# Patient Record
Sex: Male | Born: 1965 | Race: Black or African American | Hispanic: No | Marital: Married | Smoking: Former smoker
Health system: Southern US, Community
[De-identification: ages and names within clinical notes are randomized; demographics above are authoritative.]

## PROBLEM LIST (undated history)

## (undated) DIAGNOSIS — E7800 Pure hypercholesterolemia, unspecified: Secondary | ICD-10-CM

## (undated) DIAGNOSIS — K219 Gastro-esophageal reflux disease without esophagitis: Secondary | ICD-10-CM

## (undated) DIAGNOSIS — I639 Cerebral infarction, unspecified: Secondary | ICD-10-CM

## (undated) DIAGNOSIS — M542 Cervicalgia: Secondary | ICD-10-CM

## (undated) DIAGNOSIS — I10 Essential (primary) hypertension: Secondary | ICD-10-CM

## (undated) HISTORY — DX: Cervicalgia: M54.2

## (undated) HISTORY — DX: Gastro-esophageal reflux disease without esophagitis: K21.9

---

## 2001-03-13 ENCOUNTER — Encounter: Payer: Self-pay | Admitting: Family Medicine

## 2001-03-13 ENCOUNTER — Ambulatory Visit (HOSPITAL_COMMUNITY): Admission: RE | Admit: 2001-03-13 | Discharge: 2001-03-13 | Payer: Self-pay | Admitting: Family Medicine

## 2006-04-17 ENCOUNTER — Emergency Department (HOSPITAL_COMMUNITY): Admission: EM | Admit: 2006-04-17 | Discharge: 2006-04-17 | Payer: Self-pay | Admitting: Emergency Medicine

## 2007-06-18 HISTORY — PX: OTHER SURGICAL HISTORY: SHX169

## 2009-12-19 ENCOUNTER — Emergency Department (HOSPITAL_COMMUNITY): Admission: EM | Admit: 2009-12-19 | Discharge: 2009-12-20 | Payer: Self-pay | Admitting: Emergency Medicine

## 2010-01-21 ENCOUNTER — Emergency Department (HOSPITAL_COMMUNITY): Admission: EM | Admit: 2010-01-21 | Discharge: 2010-01-21 | Payer: Self-pay | Admitting: Emergency Medicine

## 2010-10-15 ENCOUNTER — Emergency Department (HOSPITAL_COMMUNITY): Payer: PRIVATE HEALTH INSURANCE

## 2010-10-15 ENCOUNTER — Emergency Department (HOSPITAL_COMMUNITY)
Admission: EM | Admit: 2010-10-15 | Discharge: 2010-10-15 | Disposition: A | Discharge status: Home / Self Care | Payer: PRIVATE HEALTH INSURANCE | Attending: Emergency Medicine | Admitting: Emergency Medicine

## 2010-10-15 DIAGNOSIS — L989 Disorder of the skin and subcutaneous tissue, unspecified: Secondary | ICD-10-CM | POA: Insufficient documentation

## 2010-10-19 LAB — CULTURE, ROUTINE-ABSCESS

## 2014-01-18 ENCOUNTER — Encounter (HOSPITAL_COMMUNITY): Payer: Self-pay | Admitting: Emergency Medicine

## 2014-01-18 ENCOUNTER — Emergency Department (HOSPITAL_COMMUNITY)
Admission: EM | Admit: 2014-01-18 | Discharge: 2014-01-18 | Disposition: A | Discharge status: Home / Self Care | Payer: PRIVATE HEALTH INSURANCE | Attending: Emergency Medicine | Admitting: Emergency Medicine

## 2014-01-18 ENCOUNTER — Emergency Department (HOSPITAL_COMMUNITY): Payer: PRIVATE HEALTH INSURANCE

## 2014-01-18 DIAGNOSIS — Z791 Long term (current) use of non-steroidal anti-inflammatories (NSAID): Secondary | ICD-10-CM | POA: Insufficient documentation

## 2014-01-18 DIAGNOSIS — Z79899 Other long term (current) drug therapy: Secondary | ICD-10-CM | POA: Insufficient documentation

## 2014-01-18 DIAGNOSIS — M25569 Pain in unspecified knee: Secondary | ICD-10-CM | POA: Insufficient documentation

## 2014-01-18 DIAGNOSIS — M25562 Pain in left knee: Secondary | ICD-10-CM

## 2014-01-18 DIAGNOSIS — F17200 Nicotine dependence, unspecified, uncomplicated: Secondary | ICD-10-CM | POA: Insufficient documentation

## 2014-01-18 DIAGNOSIS — R52 Pain, unspecified: Secondary | ICD-10-CM | POA: Insufficient documentation

## 2014-01-18 MED ORDER — OXYCODONE-ACETAMINOPHEN 5-325 MG PO TABS
1.0000 | ORAL_TABLET | Freq: Once | ORAL | Status: AC
  Administered 2014-01-18: 1 via ORAL
  Filled 2014-01-18: qty 1

## 2014-01-18 MED ORDER — NAPROXEN SODIUM 220 MG PO TABS: ORAL_TABLET | ORAL | Status: DC

## 2014-01-18 MED ORDER — IBUPROFEN 800 MG PO TABS
800.0000 mg | ORAL_TABLET | Freq: Once | ORAL | Status: AC
  Administered 2014-01-18: 800 mg via ORAL
  Filled 2014-01-18: qty 1

## 2014-01-18 MED ORDER — OXYCODONE-ACETAMINOPHEN 5-325 MG PO TABS: 1.0000 | ORAL_TABLET | ORAL | Status: DC | PRN

## 2014-01-18 NOTE — ED Notes (Signed)
Lt knee pain since 2010, worse in last week.   No recent injury

## 2014-01-18 NOTE — Discharge Instructions (Signed)

## 2014-01-19 NOTE — ED Provider Notes (Signed)
CSN: [id number]     Arrival date & time 01/18/14  1210 History   First MD Initiated Contact with Patient 01/18/14 1221     Chief Complaint  Patient presents with  . Knee Pain     (Consider location/radiation/quality/duration/timing/severity/associated sxs/prior Treatment)  Philip Howard is a 48 y.o. male who presents to the Emergency Department complaining of left knee pain Patient is a 48 y.o. male presenting with knee pain. The history is provided by the patient.  Knee Pain Location:  Knee Time since incident:  1 week Pain details:    Quality:  Throbbing and aching   Radiates to:  Does not radiate   Severity:  Moderate   Onset quality:  Gradual   Timing:  Constant   Progression:  Worsening Chronicity:  New Dislocation: no   Foreign body present:  No foreign bodies Prior injury to area: previus arthroscopic surgery to same knee. Relieved by:  Rest Worsened by:  Activity, bearing weight and flexion Ineffective treatments:  None tried Associated symptoms: stiffness and swelling   Associated symptoms: no decreased ROM, no fever, no neck pain, no numbness and no tingling     History reviewed. No pertinent past medical history. Past Surgical History  Procedure Laterality Date  . Arthroscopic surgery     History reviewed. No pertinent family history. History  Substance Use Topics  . Smoking status: Current Every Day Smoker  . Smokeless tobacco: Not on file  . Alcohol Use: Yes    Review of Systems  Constitutional: Negative for fever and chills.  Genitourinary: Negative for dysuria and difficulty urinating.  Musculoskeletal: Positive for arthralgias, joint swelling and stiffness. Negative for neck pain.  Skin: Negative for color change and wound.  All other systems reviewed and are negative.     Allergies  Review of patient's allergies indicates no known allergies.  Home Medications   Prior to Admission medications   Medication Sig Start Date End Date  Taking? Authorizing Provider  naproxen sodium (ALEVE) 220 MG tablet Take two tabs po BID with food. 01/18/14   Azarah Dacy L. Driana Dazey, PA-C  oxyCODONE-acetaminophen (PERCOCET/ROXICET) 5-325 MG per tablet Take 1 tablet by mouth every 4 (four) hours as needed. 01/18/14   Lazer Wollard L. Hajar Penninger, PA-C   BP 129/66  Pulse 78  Temp(Src) 98.9 F (37.2 C) (Oral)  Resp 15  Ht 5\' 5"  (1.651 m)  Wt 188 lb 9 oz (85.531 kg)  BMI 31.38 kg/m2  SpO2 99% Physical Exam  Nursing note and vitals reviewed. Constitutional: He is oriented to person, place, and time. He appears well-developed and well-nourished. No distress.  Cardiovascular: Normal rate, regular rhythm, normal heart sounds and intact distal pulses.   Pulmonary/Chest: Effort normal and breath sounds normal. No respiratory distress.  Musculoskeletal: He exhibits edema and tenderness.  Diffuse ttp of the left knee.  Moderate STS.  No erythema, excessive warmth, or step-off deformity.  DP pulse brisk, distal sensation intact. Calf is soft and NT.  Neurological: He is alert and oriented to person, place, and time. He exhibits normal muscle tone. Coordination normal.  Skin: Skin is warm and dry. No erythema.    ED Course  Procedures (including critical care time) Labs Review Labs Reviewed - No data to display  Imaging Review Dg Knee Complete 4 Views Left  01/18/2014   CLINICAL DATA:  Knee pain  EXAM: LEFT KNEE - COMPLETE 4+ VIEW  COMPARISON:  None.  FINDINGS: No acute fracture or dislocation is seen. Very mild medial joint  space narrowing is noted. No soft tissue changes are noted.  IMPRESSION: Mild degenerative change without acute abnormality.   Electronically Signed   By: Inez Catalina M.D.   On: 01/18/2014 13:53     EKG Interpretation None      MDM   Final diagnoses:  Knee pain, acute, left   With degree of edema to the knee joint, possible ligamentous injury is suspected.  I have discussed possible MRI of knee with the patient, but he states that  he currently does not have insurance and prefers to f/u with orthopedics first and rather not incur the expense of an MRI.  No concerning sx's for septic joint.   [place] Dr. Luna Glasgow.  He agrees to see pt in his office tomorrow for f/u.  rx's for aleve and percocet for pain.  Pt is ambulatory and stable for d/c    Leroy Pettway L. Vanessa Hector, PA-C 01/19/14 1752

## 2014-01-25 NOTE — ED Provider Notes (Signed)
Medical screening examination/treatment/procedure(s) were performed by non-physician practitioner and as supervising physician I was immediately available for consultation/collaboration.   EKG Interpretation None        Maudry Diego, MD 01/25/14 [phone]

## 2016-10-15 DIAGNOSIS — I639 Cerebral infarction, unspecified: Secondary | ICD-10-CM

## 2016-10-15 HISTORY — DX: Cerebral infarction, unspecified: I63.9

## 2016-11-02 ENCOUNTER — Emergency Department (HOSPITAL_COMMUNITY): Payer: Self-pay

## 2016-11-02 ENCOUNTER — Encounter (HOSPITAL_COMMUNITY): Payer: Self-pay | Admitting: Emergency Medicine

## 2016-11-02 ENCOUNTER — Inpatient Hospital Stay (HOSPITAL_COMMUNITY)
Admission: EM | Admit: 2016-11-02 | Discharge: 2016-11-05 | DRG: 065 | Disposition: A | Discharge status: Rehabilitation Facility | Payer: Self-pay | Attending: Family Medicine | Admitting: Family Medicine

## 2016-11-02 DIAGNOSIS — R29898 Other symptoms and signs involving the musculoskeletal system: Secondary | ICD-10-CM

## 2016-11-02 DIAGNOSIS — E785 Hyperlipidemia, unspecified: Secondary | ICD-10-CM | POA: Diagnosis present

## 2016-11-02 DIAGNOSIS — F17210 Nicotine dependence, cigarettes, uncomplicated: Secondary | ICD-10-CM | POA: Diagnosis present

## 2016-11-02 DIAGNOSIS — R7303 Prediabetes: Secondary | ICD-10-CM | POA: Diagnosis present

## 2016-11-02 DIAGNOSIS — F1010 Alcohol abuse, uncomplicated: Secondary | ICD-10-CM | POA: Diagnosis present

## 2016-11-02 DIAGNOSIS — I110 Hypertensive heart disease with heart failure: Secondary | ICD-10-CM | POA: Diagnosis present

## 2016-11-02 DIAGNOSIS — I6501 Occlusion and stenosis of right vertebral artery: Secondary | ICD-10-CM | POA: Diagnosis present

## 2016-11-02 DIAGNOSIS — F1410 Cocaine abuse, uncomplicated: Secondary | ICD-10-CM | POA: Diagnosis present

## 2016-11-02 DIAGNOSIS — R739 Hyperglycemia, unspecified: Secondary | ICD-10-CM | POA: Diagnosis present

## 2016-11-02 DIAGNOSIS — R29700 NIHSS score 0: Secondary | ICD-10-CM | POA: Diagnosis present

## 2016-11-02 DIAGNOSIS — I639 Cerebral infarction, unspecified: Principal | ICD-10-CM | POA: Diagnosis present

## 2016-11-02 DIAGNOSIS — R200 Anesthesia of skin: Secondary | ICD-10-CM | POA: Diagnosis present

## 2016-11-02 DIAGNOSIS — F1210 Cannabis abuse, uncomplicated: Secondary | ICD-10-CM | POA: Diagnosis present

## 2016-11-02 DIAGNOSIS — F1910 Other psychoactive substance abuse, uncomplicated: Secondary | ICD-10-CM | POA: Diagnosis present

## 2016-11-02 DIAGNOSIS — F17200 Nicotine dependence, unspecified, uncomplicated: Secondary | ICD-10-CM

## 2016-11-02 DIAGNOSIS — I5032 Chronic diastolic (congestive) heart failure: Secondary | ICD-10-CM | POA: Diagnosis present

## 2016-11-02 DIAGNOSIS — G8191 Hemiplegia, unspecified affecting right dominant side: Secondary | ICD-10-CM | POA: Diagnosis present

## 2016-11-02 DIAGNOSIS — E876 Hypokalemia: Secondary | ICD-10-CM | POA: Diagnosis present

## 2016-11-02 LAB — RAPID URINE DRUG SCREEN, HOSP PERFORMED
AMPHETAMINES: NOT DETECTED
BARBITURATES: NOT DETECTED
BENZODIAZEPINES: NOT DETECTED
COCAINE: POSITIVE — AB
Opiates: NOT DETECTED
Tetrahydrocannabinol: POSITIVE — AB

## 2016-11-02 LAB — CBC
HEMATOCRIT: 41.1 % (ref 39.0–52.0)
Hemoglobin: 13.8 g/dL (ref 13.0–17.0)
MCH: 30.9 pg (ref 26.0–34.0)
MCHC: 33.6 g/dL (ref 30.0–36.0)
MCV: 91.9 fL (ref 78.0–100.0)
PLATELETS: 252 10*3/uL (ref 150–400)
RBC: 4.47 MIL/uL (ref 4.22–5.81)
RDW: 15.4 % (ref 11.5–15.5)
WBC: 7 10*3/uL (ref 4.0–10.5)

## 2016-11-02 LAB — DIFFERENTIAL
Basophils Absolute: 0 10*3/uL (ref 0.0–0.1)
Basophils Relative: 0 %
Eosinophils Absolute: 0.2 10*3/uL (ref 0.0–0.7)
Eosinophils Relative: 3 %
Lymphocytes Relative: 38 %
Lymphs Abs: 2.7 10*3/uL (ref 0.7–4.0)
Monocytes Absolute: 0.5 10*3/uL (ref 0.1–1.0)
Monocytes Relative: 7 %
Neutro Abs: 3.7 10*3/uL (ref 1.7–7.7)
Neutrophils Relative %: 52 %

## 2016-11-02 LAB — COMPREHENSIVE METABOLIC PANEL
ALBUMIN: 3.9 g/dL (ref 3.5–5.0)
ALT: 38 U/L (ref 17–63)
AST: 27 U/L (ref 15–41)
Alkaline Phosphatase: 54 U/L (ref 38–126)
Anion gap: 8 (ref 5–15)
BUN: 24 mg/dL — AB (ref 6–20)
CO2: 22 mmol/L (ref 22–32)
CREATININE: 1.18 mg/dL (ref 0.61–1.24)
Calcium: 9.2 mg/dL (ref 8.9–10.3)
Chloride: 107 mmol/L (ref 101–111)
GFR calc Af Amer: >60 mL/min (ref >60)
GFR calc non Af Amer: >60 mL/min (ref >60)
Glucose, Bld: 107 mg/dL — ABNORMAL HIGH (ref 65–99)
POTASSIUM: 3.3 mmol/L — AB (ref 3.5–5.1)
SODIUM: 137 mmol/L (ref 135–145)
Total Bilirubin: 0.4 mg/dL (ref 0.3–1.2)
Total Protein: 6.6 g/dL (ref 6.5–8.1)

## 2016-11-02 LAB — URINALYSIS, ROUTINE W REFLEX MICROSCOPIC
BILIRUBIN URINE: NEGATIVE
GLUCOSE, UA: NEGATIVE mg/dL
HGB URINE DIPSTICK: NEGATIVE
Ketones, ur: NEGATIVE mg/dL
Leukocytes, UA: NEGATIVE
Nitrite: NEGATIVE
PROTEIN: NEGATIVE mg/dL
SPECIFIC GRAVITY, URINE: 1.017 (ref 1.005–1.030)
pH: 5 (ref 5.0–8.0)

## 2016-11-02 LAB — I-STAT CHEM 8, ED
BUN: 25 mg/dL — ABNORMAL HIGH (ref 6–20)
CHLORIDE: 106 mmol/L (ref 101–111)
Calcium, Ion: 1.22 mmol/L (ref 1.15–1.40)
Creatinine, Ser: 1.1 mg/dL (ref 0.61–1.24)
GLUCOSE: 106 mg/dL — AB (ref 65–99)
HEMATOCRIT: 44 % (ref 39.0–52.0)
Hemoglobin: 15 g/dL (ref 13.0–17.0)
POTASSIUM: 3.3 mmol/L — AB (ref 3.5–5.1)
Sodium: 139 mmol/L (ref 135–145)
TCO2: 23 mmol/L (ref 0–100)

## 2016-11-02 LAB — APTT: aPTT: 25 seconds (ref 24–36)

## 2016-11-02 LAB — ETHANOL: Alcohol, Ethyl (B): <5 mg/dL (ref <5)

## 2016-11-02 LAB — PROTIME-INR
INR: 0.98
Prothrombin Time: 12.9 seconds (ref 11.4–15.2)

## 2016-11-02 LAB — I-STAT TROPONIN, ED: Troponin i, poc: 0 ng/mL (ref 0.00–0.08)

## 2016-11-02 LAB — CBG MONITORING, ED: GLUCOSE-CAPILLARY: 111 mg/dL — AB (ref 65–99)

## 2016-11-02 MED ORDER — SENNOSIDES-DOCUSATE SODIUM 8.6-50 MG PO TABS
1.0000 | ORAL_TABLET | Freq: Every evening | ORAL | Status: DC | PRN
  Filled 2016-11-02: qty 1

## 2016-11-02 MED ORDER — ENOXAPARIN SODIUM 40 MG/0.4ML ~~LOC~~ SOLN
40.0000 mg | SUBCUTANEOUS | Status: DC
  Administered 2016-11-02 – 2016-11-04 (×3): 40 mg via SUBCUTANEOUS
  Filled 2016-11-02 (×2): qty 0.4

## 2016-11-02 MED ORDER — ACETAMINOPHEN 325 MG PO TABS
650.0000 mg | ORAL_TABLET | ORAL | Status: DC | PRN
  Administered 2016-11-03 – 2016-11-05 (×4): 650 mg via ORAL
  Filled 2016-11-02 (×5): qty 2

## 2016-11-02 MED ORDER — IOPAMIDOL (ISOVUE-370) INJECTION 76%
80.0000 mL | Freq: Once | INTRAVENOUS | Status: AC | PRN
  Administered 2016-11-02: 80 mL via INTRAVENOUS

## 2016-11-02 MED ORDER — POTASSIUM CHLORIDE CRYS ER 20 MEQ PO TBCR
30.0000 meq | EXTENDED_RELEASE_TABLET | Freq: Once | ORAL | Status: AC
  Administered 2016-11-02: 30 meq via ORAL

## 2016-11-02 MED ORDER — ACETAMINOPHEN 650 MG RE SUPP: 650.0000 mg | RECTAL | Status: DC | PRN

## 2016-11-02 MED ORDER — ASPIRIN 81 MG PO CHEW
324.0000 mg | CHEWABLE_TABLET | Freq: Once | ORAL | Status: AC
  Administered 2016-11-02: 324 mg via ORAL
  Filled 2016-11-02: qty 4

## 2016-11-02 MED ORDER — ACETAMINOPHEN 160 MG/5ML PO SOLN: 650.0000 mg | ORAL | Status: DC | PRN

## 2016-11-02 MED ORDER — ASPIRIN 325 MG PO TABS
325.0000 mg | ORAL_TABLET | Freq: Every day | ORAL | Status: DC
  Administered 2016-11-03 – 2016-11-05 (×3): 325 mg via ORAL
  Filled 2016-11-02 (×3): qty 1

## 2016-11-02 MED ORDER — SODIUM CHLORIDE 0.9 % IV SOLN
INTRAVENOUS | Status: AC
  Administered 2016-11-02 (×2): via INTRAVENOUS

## 2016-11-02 MED ORDER — POTASSIUM CHLORIDE CRYS ER 20 MEQ PO TBCR
EXTENDED_RELEASE_TABLET | ORAL | Status: AC
  Filled 2016-11-02: qty 2

## 2016-11-02 MED ORDER — ENOXAPARIN SODIUM 40 MG/0.4ML ~~LOC~~ SOLN
SUBCUTANEOUS | Status: AC
  Filled 2016-11-02: qty 0.4

## 2016-11-02 MED ORDER — ASPIRIN 300 MG RE SUPP: 300.0000 mg | Freq: Every day | RECTAL | Status: DC

## 2016-11-02 MED ORDER — STROKE: EARLY STAGES OF RECOVERY BOOK
Freq: Once | Status: AC
  Administered 2016-11-03: 11:00:00
  Filled 2016-11-02: qty 1

## 2016-11-02 NOTE — Progress Notes (Signed)
[facility] admissions paged to notify that patient is on the unit. Awaiting reply.

## 2016-11-02 NOTE — Progress Notes (Signed)
Patient admitted from [facility]. Patient alert and oriented x 4. Patient oriented to room. Tele placed and was verified. Will continue to monitor.

## 2016-11-02 NOTE — ED Provider Notes (Signed)
Emergency Department Provider Note   I have reviewed the triage vital signs and the nursing notes.   HISTORY  Chief Complaint Code Stroke   HPI Philip Howard is a 51 y.o. male presents to the emergency department by EMS for evaluation of his right arm "feeling funny." He notes symptoms began 1 hour prior to arrival. He denies numbness or specific weakness but states that his arm feels "heavy." No speech changes. Patient does note some feeling of "dragging my right leg" too when attempting to walk. No radiation of symptoms. No modifying factors. No other associated symptoms.   Last known normal: 2:00 PM   History reviewed. No pertinent past medical history.  There are no active problems to display for this patient.   Past Surgical History:  Procedure Laterality Date  . arthroscopic surgery        Allergies Patient has no known allergies.  History reviewed. No pertinent family history.  Social History Social History  Substance Use Topics  . Smoking status: Current Every Day Smoker    Packs/day: 0.50    Types: Cigarettes  . Smokeless tobacco: Not on file  . Alcohol use Yes    Review of Systems  Constitutional: No fever/chills Eyes: No visual changes. ENT: No sore throat. Cardiovascular: Denies chest pain. Respiratory: Denies shortness of breath. Gastrointestinal: No abdominal pain.  No nausea, no vomiting.  No diarrhea.  No constipation. Genitourinary: Negative for dysuria. Musculoskeletal: Negative for back pain. Skin: Negative for rash. Neurological: Negative for headaches. Positive right arm and leg numbness.   10-point ROS otherwise negative.  ____________________________________________   PHYSICAL EXAM:  VITAL SIGNS: Vitals:   11/02/16 1630 11/02/16 1700  BP: 138/67 (!) 147/90  Pulse: 61 80  Resp: 17 16  Temp:        Constitutional: Alert and oriented. Well appearing and in no acute distress. Eyes: Conjunctivae are normal. Head:  Atraumatic. Nose: No congestion/rhinnorhea. Mouth/Throat: Mucous membranes are moist.   Neck: No stridor.   Cardiovascular: Normal rate, regular rhythm. Good peripheral circulation. Grossly normal heart sounds.   Respiratory: Normal respiratory effort.  No retractions. Lungs CTAB. Gastrointestinal: Soft and nontender. No distention.  Musculoskeletal: No lower extremity tenderness nor edema. No gross deformities of extremities. Neurologic:  Normal speech and language. 4+/5 grip strength on the RUE. 5/5 strength in the RLE. No CN deficits 2-12. No pronator drift.  NIHSS: 1 Skin:  Skin is warm, dry and intact. No rash noted. Psychiatric: Mood and affect are normal. Speech and behavior are normal.  ____________________________________________   LABS (all labs ordered are listed, but only abnormal results are displayed)  Labs Reviewed  COMPREHENSIVE METABOLIC PANEL - Abnormal; Notable for the following:       Result Value   Potassium 3.3 (*)    Glucose, Bld 107 (*)    BUN 24 (*)    All other components within normal limits  RAPID URINE DRUG SCREEN, HOSP PERFORMED - Abnormal; Notable for the following:    Cocaine POSITIVE (*)    Tetrahydrocannabinol POSITIVE (*)    All other components within normal limits  I-STAT CHEM 8, ED - Abnormal; Notable for the following:    Potassium 3.3 (*)    BUN 25 (*)    Glucose, Bld 106 (*)    All other components within normal limits  CBG MONITORING, ED - Abnormal; Notable for the following:    Glucose-Capillary 111 (*)    All other components within normal limits  ETHANOL  PROTIME-INR  APTT  CBC  DIFFERENTIAL  URINALYSIS, ROUTINE W REFLEX MICROSCOPIC  I-STAT TROPOININ, ED   ____________________________________________  EKG   EKG Interpretation  Date/Time:  Saturday Nov 02 2016 15:30:27 EDT Ventricular Rate:  67 PR Interval:    QRS Duration: 105 QT Interval:  394 QTC Calculation: 416 R Axis:   69 Text Interpretation:  Sinus rhythm  Nonspecific T abnormalities, inferior leads No STEMI. No old tracing for comparison.  Confirmed by Nanda Quinton [phone]) on 11/02/2016 3:40:17 PM       ____________________________________________  RADIOLOGY  Ct Head Code Stroke Wo Contrast`  Result Date: 11/02/2016 CLINICAL DATA:  Code stroke. Sudden onset of right-sided weakness while eating lunch today. EXAM: CT HEAD WITHOUT CONTRAST TECHNIQUE: Contiguous axial images were obtained from the base of the skull through the vertex without intravenous contrast. COMPARISON:  None. FINDINGS: Brain: No acute infarct, hemorrhage, or mass lesion is present. The ventricles are of normal size. No significant extraaxial fluid collection is present. Vascular: No hyperdense vessel or unexpected calcification. Skull: Normal. Negative for fracture or focal lesion. Sinuses/Orbits: The paranasal sinuses and mastoid air cells are clear. The globes and orbits are within normal limits. ASPECTS [facility] Stroke Program Early CT Score) - Ganglionic level infarction (caudate, lentiform nuclei, internal capsule, insula, M1-M3 cortex): 7/7 - Supraganglionic infarction (M4-M6 cortex): 3/3 Total score (0-10 with 10 being normal): 10/10 IMPRESSION: 1. Negative CT of the head 2. ASPECTS is 10/10 These results were called by telephone at the time of interpretation on 11/02/2016 at 3:20 pm to Dr. Nanda Quinton , who verbally acknowledged these results. Electronically Signed   By: San Morelle M.D.   On: 11/02/2016 15:21    ____________________________________________   PROCEDURES  Procedure(s) performed:   Procedures  CRITICAL CARE Performed by: Margette Fast Total critical care time: 40 minutes Critical care time was exclusive of separately billable procedures and treating other patients. Critical care was necessary to treat or prevent imminent or life-threatening deterioration. Critical care was time spent personally by me on the following activities: development  of treatment plan with patient and/or surrogate as well as nursing, discussions with consultants, evaluation of patient's response to treatment, examination of patient, obtaining history from patient or surrogate, ordering and performing treatments and interventions, ordering and review of laboratory studies, ordering and review of radiographic studies, pulse oximetry and re-evaluation of patient's condition.  Nanda Quinton, MD Emergency Medicine  ____________________________________________   INITIAL IMPRESSION / ASSESSMENT AND PLAN / ED COURSE  Pertinent labs & imaging results that were available during my care of the patient were reviewed by me and considered in my medical decision making (see chart for details).  Patient resents to the emergency department for evaluation of sudden onset right upper extremity weakness. He has mild weakness in the right upper extremity. No appreciable deficits and right lower extremity. No facial asymmetry. CODE STROKE activated.   03:20 PM CT called back as normal per radiology.   03:29 PM  Tele-Neurology at bedside.   03:37 PM Spoke with Dr. Bernita Buffy with Neurology after video exam. He does not appreciate any weakness on video exam. Patient continues to experience some right arm "heavingess." Gives NIHSS of 0. Discussed tPA with the patient who does not want this treatment. Advises CTA of head and neck with plan for admission and MRI.   05:10 PM Called by Radiology regarding the patient's CTA. 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 Will call neurology to  discuss potential endovascular options for treatment.   05:21 PM Spoke with Dr. Shon Hale who will see the patient in consultation at [facility]. Likely not intervention at this time with very mild deficits. Awaiting call back from Neuro IR to confirm no aggressive endovascular therapy at this time with low NIH.   07:01 PM Spoke with Dr. Estanislado Pandy with Neuro IR.  Plan for consult when patient gets to [facility]. Likely will do an arteriogram and consider intervention if the patient deteriorates.  ____________________________________________  FINAL CLINICAL IMPRESSION(S) / ED DIAGNOSES  Final diagnoses:  Stroke ([place])     MEDICATIONS GIVEN DURING THIS VISIT:  Medications  aspirin chewable tablet 324 mg (324 mg Oral Given 11/02/16 1608)  iopamidol (ISOVUE-370) 76 % injection 80 mL (80 mLs Intravenous Contrast Given 11/02/16 1637)     NEW OUTPATIENT MEDICATIONS STARTED DURING THIS VISIT:  None   Note:  This document was prepared using Dragon voice recognition software and may include unintentional dictation errors.  Nanda Quinton, MD Emergency Medicine   Shamonica Schadt, [facility], MD 11/03/16 [phone]

## 2016-11-02 NOTE — ED Notes (Addendum)
Pt called out stating R side of lips are "numb" pt is moving all extremities, strength equal and bilateral, no deficits noted. MD Long notified.

## 2016-11-02 NOTE — ED Notes (Signed)
MD Long at bedside

## 2016-11-02 NOTE — ED Triage Notes (Signed)
Pt reports sitting at kitchen table eating lunch at 1430 and noted his right leg and arm were heavy.  No weakness noted, pt reports numbness.

## 2016-11-02 NOTE — H&P (Signed)
History and Physical    Philip Howard EXB:[id number] DOB: 15-Sep-1965 DOA: 11/02/2016  PCP: Patient, No Pcp Per   Patient coming from: Home  Chief Complaint: Right arm and leg "heaviness"   HPI: Philip Howard is a 51 y.o. male who denies any significant past medical history, but also does not see a physician regularly, now presenting to the emergency department with acute onset of right-sided numbness. Patient reports that he woke in his usual state of health and was having an uneventful day when he was eating lunch at approximately 2:30 PM and noted in acute "heavy" sensation involving the right arm and right leg. He denies weakness, but describes it as more of a numbness. He denies any associated chest pain or palpitations and denies any headache, change in vision or hearing, confusion, or loss of coordination. There has not been any recent fall or trauma and the patient denies any use of illicit substances or any recent alcohol use. He has never experienced similar symptoms previously. He reports that these symptoms persisted unchanged and he came into the ED for evaluation. He was able to ambulate without difficulty.  ED Course: Upon arrival to the ED, patient is found to be afebrile, saturating well on room air, mildly hypertensive, and with vitals otherwise stable. EKG features sinus rhythm with T-wave inversions in the inferior leads and no prior EKG available. Noncontrast head CT is negative for acute intracranial abnormality. Chemistry panels notable for potassium 3.3. CBC is unremarkable, INR is within normal limits, troponin is undetectable, ethanol level is undetectable, urinalysis is unremarkable, and UDS is positive for cocaine and THC. CTA head and neck was obtained and demonstrates occlusion of the right vertebral artery from its origin, as well as intracranial atherosclerosis resulting in mild-to-moderate stenosis of the bilateral proximal middle cerebral arteries. Neurology was  consulted by the ED physician and advised against tPA, and also did not feel that any emergent intervention would be needed. Medical admission to [facility] was advised. The patient is remained hemodynamically stable in the ED and has not been in any apparent respiratory distress. He will be observed on the telemetry unit at [facility] for ongoing evaluation and management of acute right-sided numbness concerning for possible CVA.  Review of Systems:  All other systems reviewed and apart from HPI, are negative.  History reviewed. No pertinent past medical history.  Past Surgical History:  Procedure Laterality Date  . arthroscopic surgery       reports that he has been smoking Cigarettes.  He has been smoking about 0.50 packs per day. He does not have any smokeless tobacco history on file. He reports that he drinks alcohol. He reports that he does not use drugs.  No Known Allergies  History reviewed. No pertinent family history.   Prior to Admission medications   Not on File    Physical Exam: Vitals:   11/02/16 1604 11/02/16 1630 11/02/16 1700 11/02/16 1800  BP:  138/67 (!) 147/90 (!) 151/83  Pulse:  61 80 65  Resp:  17 16 18   Temp: 97.6 F (36.4 C)     TempSrc:      SpO2:  97% 99% 99%      Constitutional: NAD, calm, comfortable Eyes: PERTLA, lids and conjunctivae normal ENMT: Mucous membranes are moist. Posterior pharynx clear of any exudate or lesions.   Neck: normal, supple, no masses, no thyromegaly Respiratory: clear to auscultation bilaterally, no wheezing, no crackles. Normal respiratory effort.  Cardiovascular: S1 &  S2 heard, regular rate and rhythm. No extremity edema. No significant JVD. Abdomen: No distension, no tenderness, no masses palpated. Bowel sounds normal.  Musculoskeletal: no clubbing / cyanosis. No joint deformity upper and lower extremities.  Skin: no significant rashes, lesions, ulcers. Warm, dry, well-perfused. Neurologic: CN  2-12 grossly intact. Patellar DTR's normal. Strength 5/5 in all 4 limbs. Sensation to light touch diminished throughout RUE, RLE, and right lower face.  Psychiatric: Alert and oriented x 3. Calm.     Labs on Admission: I have personally reviewed following labs and imaging studies  CBC:  Recent Labs Lab 11/02/16 1510 11/02/16 1534  WBC 7.0  --   NEUTROABS 3.7  --   HGB 13.8 15.0  HCT 41.1 44.0  MCV 91.9  --   PLT 252  --    Basic Metabolic Panel:  Recent Labs Lab 11/02/16 1518 11/02/16 1534  NA 137 139  K 3.3* 3.3*  CL 107 106  CO2 22  --   GLUCOSE 107* 106*  BUN 24* 25*  CREATININE 1.18 1.10  CALCIUM 9.2  --    GFR: CrCl cannot be calculated (Unknown ideal weight.). Liver Function Tests:  Recent Labs Lab 11/02/16 1518  AST 27  ALT 38  ALKPHOS 54  BILITOT 0.4  PROT 6.6  ALBUMIN 3.9   No results for input(s): LIPASE, AMYLASE in the last 168 hours. No results for input(s): AMMONIA in the last 168 hours. Coagulation Profile:  Recent Labs Lab 11/02/16 1518  INR 0.98   Cardiac Enzymes: No results for input(s): CKTOTAL, CKMB, CKMBINDEX, TROPONINI in the last 168 hours. BNP (last 3 results) No results for input(s): PROBNP in the last 8760 hours. HbA1C: No results for input(s): HGBA1C in the last 72 hours. CBG:  Recent Labs Lab 11/02/16 1520  GLUCAP 111*   Lipid Profile: No results for input(s): CHOL, HDL, LDLCALC, TRIG, CHOLHDL, LDLDIRECT in the last 72 hours. Thyroid Function Tests: No results for input(s): TSH, T4TOTAL, FREET4, T3FREE, THYROIDAB in the last 72 hours. Anemia Panel: No results for input(s): VITAMINB12, FOLATE, FERRITIN, TIBC, IRON, RETICCTPCT in the last 72 hours. Urine analysis:    Component Value Date/Time   COLORURINE YELLOW 11/02/2016 1611   APPEARANCEUR CLEAR 11/02/2016 1611   LABSPEC 1.017 11/02/2016 1611   PHURINE 5.0 11/02/2016 1611   GLUCOSEU NEGATIVE 11/02/2016 1611   HGBUR NEGATIVE 11/02/2016 1611   BILIRUBINUR  NEGATIVE 11/02/2016 1611   KETONESUR NEGATIVE 11/02/2016 1611   PROTEINUR NEGATIVE 11/02/2016 1611   NITRITE NEGATIVE 11/02/2016 1611   LEUKOCYTESUR NEGATIVE 11/02/2016 1611   Sepsis Labs: @LABRCNTIP (procalcitonin:4,lacticidven:4) )No results found for this or any previous visit (from the past 240 hour(s)).   Radiological Exams on Admission: Ct Angio Head W Or Wo Contrast  Result Date: 11/02/2016 CLINICAL DATA:  RIGHT-sided weakness at lunch today. Assess for stroke. EXAM: CT ANGIOGRAPHY HEAD AND NECK TECHNIQUE: Multidetector CT imaging of the head and neck was performed using the standard protocol during bolus administration of intravenous contrast. Multiplanar CT image reconstructions and MIPs were obtained to evaluate the vascular anatomy. Carotid stenosis measurements (when applicable) are obtained utilizing NASCET criteria, using the distal internal carotid diameter as the denominator. CONTRAST:  80 cc Isovue 370 COMPARISON:  CT HEAD Nov 02, 2016 at 1511 hours FINDINGS: CTA NECK AORTIC ARCH: Normal appearance of the thoracic arch, 2 vessel arch is a normal variant. Trace calcific atherosclerosis of aortic arch. The origins of the innominate, left Common carotid artery and subclavian artery are widely patent.  RIGHT CAROTID SYSTEM: Common carotid artery is widely patent. Normal appearance of the carotid bifurcation without hemodynamically significant stenosis by NASCET criteria. Normal appearance of the included internal carotid artery. LEFT CAROTID SYSTEM: Common carotid artery is widely patent. Normal appearance of the carotid bifurcation without hemodynamically significant stenosis by NASCET criteria. Normal appearance of the included internal carotid artery. VERTEBRAL ARTERIES:RIGHT vertebral artery is occluded from the origin distally without reconstitution in the neck. LEFT vertebral artery is widely patent normal in appearance. SKELETON: No acute osseous process though bone windows have not  been submitted. Patient is edentulous. Calcified bilateral stylohyoid ligaments. OTHER NECK: Soft tissues of the neck are non-acute though, not tailored for evaluation. Subcentimeter nonspecific mediastinal lymph nodes may be reactive. CTA HEAD ANTERIOR CIRCULATION: Patent cervical internal carotid arteries, petrous, cavernous and supra clinoid internal carotid arteries. Widely patent anterior communicating artery. Patent anterior and middle cerebral arteries, mild to moderate tandem stenoses regularity proximal bilateral middle cerebral artery is. No large vessel occlusion, hemodynamically significant stenosis, dissection, contrast extravasation or aneurysm. POSTERIOR CIRCULATION: Retrograde flow of distal RIGHT V4 segment. Mild luminal irregularity basilar artery and posterior cerebral arteries. LEFT V4 segment is widely patent. Normal appearance of the basilar artery main branch vessels. Predominately fetal origin of RIGHT posterior cerebral artery. No hemodynamically significant stenosis, dissection, contrast extravasation or aneurysm. VENOUS SINUSES: Major dural venous sinuses are patent though not tailored for evaluation on this angiographic examination. ANATOMIC VARIANTS: None. DELAYED PHASE: No abnormal intracranial enhancement. MIP images reviewed. IMPRESSION: CTA NECK: Occluded RIGHT vertebral artery without reconstitution in the neck, likely acute. Otherwise negative CTA NECK. CTA HEAD: No emergent large vessel occlusion or severe stenosis. Retrograde flow to distal RIGHT V4 segment. Intracranial atherosclerosis resulting in mild-to-moderate stenoses of the bilateral proximal middle cerebral arteries. Critical Value/emergent results were called by telephone at the time of interpretation on 11/02/2016 at 5:11 pm to Dr. Nanda Quinton , who verbally acknowledged these results. Electronically Signed   By: Elon Alas M.D.   On: 11/02/2016 17:14   Ct Angio Neck W And/or Wo Contrast  Result Date:  11/02/2016 CLINICAL DATA:  RIGHT-sided weakness at lunch today. Assess for stroke. EXAM: CT ANGIOGRAPHY HEAD AND NECK TECHNIQUE: Multidetector CT imaging of the head and neck was performed using the standard protocol during bolus administration of intravenous contrast. Multiplanar CT image reconstructions and MIPs were obtained to evaluate the vascular anatomy. Carotid stenosis measurements (when applicable) are obtained utilizing NASCET criteria, using the distal internal carotid diameter as the denominator. CONTRAST:  80 cc Isovue 370 COMPARISON:  CT HEAD Nov 02, 2016 at 1511 hours FINDINGS: CTA NECK AORTIC ARCH: Normal appearance of the thoracic arch, 2 vessel arch is a normal variant. Trace calcific atherosclerosis of aortic arch. The origins of the innominate, left Common carotid artery and subclavian artery are widely patent. RIGHT CAROTID SYSTEM: Common carotid artery is widely patent. Normal appearance of the carotid bifurcation without hemodynamically significant stenosis by NASCET criteria. Normal appearance of the included internal carotid artery. LEFT CAROTID SYSTEM: Common carotid artery is widely patent. Normal appearance of the carotid bifurcation without hemodynamically significant stenosis by NASCET criteria. Normal appearance of the included internal carotid artery. VERTEBRAL ARTERIES:RIGHT vertebral artery is occluded from the origin distally without reconstitution in the neck. LEFT vertebral artery is widely patent normal in appearance. SKELETON: No acute osseous process though bone windows have not been submitted. Patient is edentulous. Calcified bilateral stylohyoid ligaments. OTHER NECK: Soft tissues of the neck are non-acute though, not tailored  for evaluation. Subcentimeter nonspecific mediastinal lymph nodes may be reactive. CTA HEAD ANTERIOR CIRCULATION: Patent cervical internal carotid arteries, petrous, cavernous and supra clinoid internal carotid arteries. Widely patent anterior  communicating artery. Patent anterior and middle cerebral arteries, mild to moderate tandem stenoses regularity proximal bilateral middle cerebral artery is. No large vessel occlusion, hemodynamically significant stenosis, dissection, contrast extravasation or aneurysm. POSTERIOR CIRCULATION: Retrograde flow of distal RIGHT V4 segment. Mild luminal irregularity basilar artery and posterior cerebral arteries. LEFT V4 segment is widely patent. Normal appearance of the basilar artery main branch vessels. Predominately fetal origin of RIGHT posterior cerebral artery. No hemodynamically significant stenosis, dissection, contrast extravasation or aneurysm. VENOUS SINUSES: Major dural venous sinuses are patent though not tailored for evaluation on this angiographic examination. ANATOMIC VARIANTS: None. DELAYED PHASE: No abnormal intracranial enhancement. MIP images reviewed. IMPRESSION: CTA NECK: Occluded RIGHT vertebral artery without reconstitution in the neck, likely acute. Otherwise negative CTA NECK. CTA HEAD: No emergent large vessel occlusion or severe stenosis. Retrograde flow to distal RIGHT V4 segment. Intracranial atherosclerosis resulting in mild-to-moderate stenoses of the bilateral proximal middle cerebral arteries. Critical Value/emergent results were called by telephone at the time of interpretation on 11/02/2016 at 5:11 pm to Dr. Nanda Quinton , who verbally acknowledged these results. Electronically Signed   By: Elon Alas M.D.   On: 11/02/2016 17:14   Ct Head Code Stroke Wo Contrast`  Result Date: 11/02/2016 CLINICAL DATA:  Code stroke. Sudden onset of right-sided weakness while eating lunch today. EXAM: CT HEAD WITHOUT CONTRAST TECHNIQUE: Contiguous axial images were obtained from the base of the skull through the vertex without intravenous contrast. COMPARISON:  None. FINDINGS: Brain: No acute infarct, hemorrhage, or mass lesion is present. The ventricles are of normal size. No significant  extraaxial fluid collection is present. Vascular: No hyperdense vessel or unexpected calcification. Skull: Normal. Negative for fracture or focal lesion. Sinuses/Orbits: The paranasal sinuses and mastoid air cells are clear. The globes and orbits are within normal limits. ASPECTS [facility] Stroke Program Early CT Score) - Ganglionic level infarction (caudate, lentiform nuclei, internal capsule, insula, M1-M3 cortex): 7/7 - Supraganglionic infarction (M4-M6 cortex): 3/3 Total score (0-10 with 10 being normal): 10/10 IMPRESSION: 1. Negative CT of the head 2. ASPECTS is 10/10 These results were called by telephone at the time of interpretation on 11/02/2016 at 3:20 pm to Dr. Nanda Quinton , who verbally acknowledged these results. Electronically Signed   By: San Morelle M.D.   On: 11/02/2016 15:21    EKG: Independently reviewed. Sinus rhythm, inferior T-wave inversions. No prior available.    Assessment/Plan  1. Right-sided numbness  - Pt presents with acute-onset of numbness involving right arm, right leg, and right lower face  - CT head is negative; CTA head and neck with occlusion of right vertebral artery and mild-moderate stenosis of b/l middle cerebral arteries  - Neurology is consulting and much appreciated; tPA not considered d/t patient refusal; no emergent intervention indicated  - Observe on telemetry with frequent neuro checks, PT/OT/SLP evals, permissive HTN  - Obtain MRI brain, MRA head, echocardiogram, fasting lipid panel, and A1c - Continue prophylactic aspirin   2. Right vertebral artery occlusion   - CTA demonstrates occlusion of right vertebral artery at the origin  - ED physician discussed with neuro and neuro-interventionalist and no emergent intervention indicated    3. Elevated BP  - BP mildly elevated in ED - Given concern for acute CVA with arterial occlusion, will permit HTN to 932/355 in acute  phase - Labetalol IVP's prn SBP >220 or DBP >120    4. Hypokalemia  -  Serum potassium is 3.3 on admission  - Treated with 30 mEq oral potassium  - He is monitored on telemetry  - Repeat chem panel in am    5. Substance abuse  - UDS positive for cocaine and THC, patient denies use - SW consultation requested   DVT prophylaxis: sq Lovenox  Code Status: Full  Family Communication: Discussed with patient Disposition Plan: Observe on med-surg Consults called: Neurology  Admission status: Observation     Vianne Bulls, MD [facility] Pager [phone]  If 7PM-7AM, please contact night-coverage [email or web address] Password [facility]  11/02/2016, 6:28 PM

## 2016-11-02 NOTE — ED Notes (Signed)
No TPA per teleneuro.

## 2016-11-02 NOTE — Progress Notes (Signed)
CODE STROKE [place]

## 2016-11-02 NOTE — ED Notes (Signed)
Pt in CT unable to perform neuro check.

## 2016-11-03 ENCOUNTER — Observation Stay (HOSPITAL_COMMUNITY): Payer: Self-pay

## 2016-11-03 ENCOUNTER — Encounter (HOSPITAL_COMMUNITY): Payer: Self-pay

## 2016-11-03 ENCOUNTER — Observation Stay (HOSPITAL_BASED_OUTPATIENT_CLINIC_OR_DEPARTMENT_OTHER): Payer: Self-pay

## 2016-11-03 DIAGNOSIS — I639 Cerebral infarction, unspecified: Principal | ICD-10-CM

## 2016-11-03 DIAGNOSIS — I5030 Unspecified diastolic (congestive) heart failure: Secondary | ICD-10-CM

## 2016-11-03 DIAGNOSIS — I5032 Chronic diastolic (congestive) heart failure: Secondary | ICD-10-CM

## 2016-11-03 LAB — ECHOCARDIOGRAM COMPLETE: HEIGHTINCHES: 65 in

## 2016-11-03 MED ORDER — LORAZEPAM 2 MG/ML IJ SOLN
1.0000 mg | Freq: Once | INTRAMUSCULAR | Status: AC
  Administered 2016-11-03: 1 mg via INTRAVENOUS
  Filled 2016-11-03: qty 1

## 2016-11-03 NOTE — Progress Notes (Signed)
Rehab Admissions Coordinator Note:  Patient was screened by Retta Diones for appropriateness for an Inpatient Acute Rehab Consult.  At this time, we are recommending Inpatient Rehab consult.  Retta Diones 11/03/2016, 7:13 PM  I can be reached at [phone].

## 2016-11-03 NOTE — Progress Notes (Signed)
PROGRESS NOTE    Philip Howard  QJJ:[id number] DOB: 07-27-1965 DOA: 11/02/2016 PCP: Patient, No Pcp Per   Brief Narrative: Philip Howard is a 51 y.o. male with no significant past medical history. He presented with right-sided heaviness and numbness. Concern for acute CVA with evidence of right vertebral artery occlusion and mild to moderate stenosis of bilateral middle cerebral arteries. MRI is pending. UDS positive for cocaine and THC.   Assessment & Plan:   Principal Problem:   Numbness on right side Active Problems:   Hypokalemia   Substance abuse   Vertebral artery occlusion, right   Stroke (HCC)   Right sided numbness Likely secondary to stroke. MRI still not obtained. CTA of head and neck with occlusion of right vertebral artery and mild to moderate stenosis of bilateral middle cerebral arteries. Patient with positive cocaine, which possibly could have contributed. -MRI -Neurology recommendations -Echocardiogram pending -Lipid panel (not obtained this AM) -continue Aspirin -PT/OT eval  Right vertebral artery occlusion -neurology recommendations  Elevated BP -Permissive hypertension  Hypokalemia -given supplementation  Substance abuse UDS positive for cocaine and THC. Discussed with patient that use should cease as it could be contributory  DVT prophylaxis: Lovenox Code Status: Full code Family Communication: Fiance at bedside Disposition Plan: Discharge likely in 24 hours   Consultants:   Neurology  Procedures:   None  Antimicrobials:  None    Subjective: Patient reports continued numbness. No weakness.  Objective: Vitals:   11/03/16 0330 11/03/16 0730 11/03/16 0930 11/03/16 1100  BP: (!) 138/109 (!) 152/100 (!) 152/83 (!) 152/108  Pulse: (!) 51 (!) 56 80 63  Resp:   18 20  Temp:   98.1 F (36.7 C)   TempSrc:   Oral   SpO2: (!) 6%  98%   Height:        Intake/Output Summary (Last 24 hours) at 11/03/16 1158 Last data filed  at 11/02/16 2342  Gross per 24 hour  Intake             1000 ml  Output                0 ml  Net             1000 ml   There were no vitals filed for this visit.  Examination:  General exam: Appears calm and comfortable  Respiratory system: Clear to auscultation. Respiratory effort normal. Cardiovascular system: S1 & S2 heard, RRR. No murmurs, rubs, gallops or clicks. Gastrointestinal system: Abdomen is nondistended, soft and nontender. No organomegaly or masses felt. Normal bowel sounds heard. Central nervous system: Alert and oriented. Right sided numbness. 5/5 strength. Extremities: No edema. No calf tenderness Skin: No cyanosis. No rashes Psychiatry: Judgement and insight appear normal. Mood & affect appropriate.     Data Reviewed: I have personally reviewed following labs and imaging studies  CBC:  Recent Labs Lab 11/02/16 1510 11/02/16 1534  WBC 7.0  --   NEUTROABS 3.7  --   HGB 13.8 15.0  HCT 41.1 44.0  MCV 91.9  --   PLT 252  --    Basic Metabolic Panel:  Recent Labs Lab 11/02/16 1518 11/02/16 1534  NA 137 139  K 3.3* 3.3*  CL 107 106  CO2 22  --   GLUCOSE 107* 106*  BUN 24* 25*  CREATININE 1.18 1.10  CALCIUM 9.2  --    GFR: CrCl cannot be calculated (Unknown ideal weight.). Liver Function Tests:  Recent Labs Lab  11/02/16 1518  AST 27  ALT 38  ALKPHOS 54  BILITOT 0.4  PROT 6.6  ALBUMIN 3.9   No results for input(s): LIPASE, AMYLASE in the last 168 hours. No results for input(s): AMMONIA in the last 168 hours. Coagulation Profile:  Recent Labs Lab 11/02/16 1518  INR 0.98   Cardiac Enzymes: No results for input(s): CKTOTAL, CKMB, CKMBINDEX, TROPONINI in the last 168 hours. BNP (last 3 results) No results for input(s): PROBNP in the last 8760 hours. HbA1C: No results for input(s): HGBA1C in the last 72 hours. CBG:  Recent Labs Lab 11/02/16 1520  GLUCAP 111*   Lipid Profile: No results for input(s): CHOL, HDL, LDLCALC, TRIG,  CHOLHDL, LDLDIRECT in the last 72 hours. Thyroid Function Tests: No results for input(s): TSH, T4TOTAL, FREET4, T3FREE, THYROIDAB in the last 72 hours. Anemia Panel: No results for input(s): VITAMINB12, FOLATE, FERRITIN, TIBC, IRON, RETICCTPCT in the last 72 hours. Sepsis Labs: No results for input(s): PROCALCITON, LATICACIDVEN in the last 168 hours.  No results found for this or any previous visit (from the past 240 hour(s)).       Radiology Studies: Ct Angio Head W Or Wo Contrast  Result Date: 11/02/2016 CLINICAL DATA:  RIGHT-sided weakness at lunch today. Assess for stroke. EXAM: CT ANGIOGRAPHY HEAD AND NECK TECHNIQUE: Multidetector CT imaging of the head and neck was performed using the standard protocol during bolus administration of intravenous contrast. Multiplanar CT image reconstructions and MIPs were obtained to evaluate the vascular anatomy. Carotid stenosis measurements (when applicable) are obtained utilizing NASCET criteria, using the distal internal carotid diameter as the denominator. CONTRAST:  80 cc Isovue 370 COMPARISON:  CT HEAD Nov 02, 2016 at 1511 hours FINDINGS: CTA NECK AORTIC ARCH: Normal appearance of the thoracic arch, 2 vessel arch is a normal variant. Trace calcific atherosclerosis of aortic arch. The origins of the innominate, left Common carotid artery and subclavian artery are widely patent. RIGHT CAROTID SYSTEM: Common carotid artery is widely patent. Normal appearance of the carotid bifurcation without hemodynamically significant stenosis by NASCET criteria. Normal appearance of the included internal carotid artery. LEFT CAROTID SYSTEM: Common carotid artery is widely patent. Normal appearance of the carotid bifurcation without hemodynamically significant stenosis by NASCET criteria. Normal appearance of the included internal carotid artery. VERTEBRAL ARTERIES:RIGHT vertebral artery is occluded from the origin distally without reconstitution in the neck. LEFT  vertebral artery is widely patent normal in appearance. SKELETON: No acute osseous process though bone windows have not been submitted. Patient is edentulous. Calcified bilateral stylohyoid ligaments. OTHER NECK: Soft tissues of the neck are non-acute though, not tailored for evaluation. Subcentimeter nonspecific mediastinal lymph nodes may be reactive. CTA HEAD ANTERIOR CIRCULATION: Patent cervical internal carotid arteries, petrous, cavernous and supra clinoid internal carotid arteries. Widely patent anterior communicating artery. Patent anterior and middle cerebral arteries, mild to moderate tandem stenoses regularity proximal bilateral middle cerebral artery is. No large vessel occlusion, hemodynamically significant stenosis, dissection, contrast extravasation or aneurysm. POSTERIOR CIRCULATION: Retrograde flow of distal RIGHT V4 segment. Mild luminal irregularity basilar artery and posterior cerebral arteries. LEFT V4 segment is widely patent. Normal appearance of the basilar artery main branch vessels. Predominately fetal origin of RIGHT posterior cerebral artery. No hemodynamically significant stenosis, dissection, contrast extravasation or aneurysm. VENOUS SINUSES: Major dural venous sinuses are patent though not tailored for evaluation on this angiographic examination. ANATOMIC VARIANTS: None. DELAYED PHASE: No abnormal intracranial enhancement. MIP images reviewed. IMPRESSION: CTA NECK: Occluded RIGHT vertebral artery without reconstitution in the  neck, likely acute. Otherwise negative CTA NECK. CTA HEAD: No emergent large vessel occlusion or severe stenosis. Retrograde flow to distal RIGHT V4 segment. Intracranial atherosclerosis resulting in mild-to-moderate stenoses of the bilateral proximal middle cerebral arteries. Critical Value/emergent results were called by telephone at the time of interpretation on 11/02/2016 at 5:11 pm to Dr. Nanda Quinton , who verbally acknowledged these results. Electronically  Signed   By: Elon Alas M.D.   On: 11/02/2016 17:14   Ct Angio Neck W And/or Wo Contrast  Result Date: 11/02/2016 CLINICAL DATA:  RIGHT-sided weakness at lunch today. Assess for stroke. EXAM: CT ANGIOGRAPHY HEAD AND NECK TECHNIQUE: Multidetector CT imaging of the head and neck was performed using the standard protocol during bolus administration of intravenous contrast. Multiplanar CT image reconstructions and MIPs were obtained to evaluate the vascular anatomy. Carotid stenosis measurements (when applicable) are obtained utilizing NASCET criteria, using the distal internal carotid diameter as the denominator. CONTRAST:  80 cc Isovue 370 COMPARISON:  CT HEAD Nov 02, 2016 at 1511 hours FINDINGS: CTA NECK AORTIC ARCH: Normal appearance of the thoracic arch, 2 vessel arch is a normal variant. Trace calcific atherosclerosis of aortic arch. The origins of the innominate, left Common carotid artery and subclavian artery are widely patent. RIGHT CAROTID SYSTEM: Common carotid artery is widely patent. Normal appearance of the carotid bifurcation without hemodynamically significant stenosis by NASCET criteria. Normal appearance of the included internal carotid artery. LEFT CAROTID SYSTEM: Common carotid artery is widely patent. Normal appearance of the carotid bifurcation without hemodynamically significant stenosis by NASCET criteria. Normal appearance of the included internal carotid artery. VERTEBRAL ARTERIES:RIGHT vertebral artery is occluded from the origin distally without reconstitution in the neck. LEFT vertebral artery is widely patent normal in appearance. SKELETON: No acute osseous process though bone windows have not been submitted. Patient is edentulous. Calcified bilateral stylohyoid ligaments. OTHER NECK: Soft tissues of the neck are non-acute though, not tailored for evaluation. Subcentimeter nonspecific mediastinal lymph nodes may be reactive. CTA HEAD ANTERIOR CIRCULATION: Patent cervical  internal carotid arteries, petrous, cavernous and supra clinoid internal carotid arteries. Widely patent anterior communicating artery. Patent anterior and middle cerebral arteries, mild to moderate tandem stenoses regularity proximal bilateral middle cerebral artery is. No large vessel occlusion, hemodynamically significant stenosis, dissection, contrast extravasation or aneurysm. POSTERIOR CIRCULATION: Retrograde flow of distal RIGHT V4 segment. Mild luminal irregularity basilar artery and posterior cerebral arteries. LEFT V4 segment is widely patent. Normal appearance of the basilar artery main branch vessels. Predominately fetal origin of RIGHT posterior cerebral artery. No hemodynamically significant stenosis, dissection, contrast extravasation or aneurysm. VENOUS SINUSES: Major dural venous sinuses are patent though not tailored for evaluation on this angiographic examination. ANATOMIC VARIANTS: None. DELAYED PHASE: No abnormal intracranial enhancement. MIP images reviewed. IMPRESSION: CTA NECK: Occluded RIGHT vertebral artery without reconstitution in the neck, likely acute. Otherwise negative CTA NECK. CTA HEAD: No emergent large vessel occlusion or severe stenosis. Retrograde flow to distal RIGHT V4 segment. Intracranial atherosclerosis resulting in mild-to-moderate stenoses of the bilateral proximal middle cerebral arteries. Critical Value/emergent results were called by telephone at the time of interpretation on 11/02/2016 at 5:11 pm to Dr. Nanda Quinton , who verbally acknowledged these results. Electronically Signed   By: Elon Alas M.D.   On: 11/02/2016 17:14   Ct Head Code Stroke Wo Contrast`  Result Date: 11/02/2016 CLINICAL DATA:  Code stroke. Sudden onset of right-sided weakness while eating lunch today. EXAM: CT HEAD WITHOUT CONTRAST TECHNIQUE: Contiguous axial images were obtained  from the base of the skull through the vertex without intravenous contrast. COMPARISON:  None. FINDINGS:  Brain: No acute infarct, hemorrhage, or mass lesion is present. The ventricles are of normal size. No significant extraaxial fluid collection is present. Vascular: No hyperdense vessel or unexpected calcification. Skull: Normal. Negative for fracture or focal lesion. Sinuses/Orbits: The paranasal sinuses and mastoid air cells are clear. The globes and orbits are within normal limits. ASPECTS [facility] Stroke Program Early CT Score) - Ganglionic level infarction (caudate, lentiform nuclei, internal capsule, insula, M1-M3 cortex): 7/7 - Supraganglionic infarction (M4-M6 cortex): 3/3 Total score (0-10 with 10 being normal): 10/10 IMPRESSION: 1. Negative CT of the head 2. ASPECTS is 10/10 These results were called by telephone at the time of interpretation on 11/02/2016 at 3:20 pm to Dr. Nanda Quinton , who verbally acknowledged these results. Electronically Signed   By: San Morelle M.D.   On: 11/02/2016 15:21        Scheduled Meds: . aspirin  300 mg Rectal Daily   Or  . aspirin  325 mg Oral Daily  . enoxaparin (LOVENOX) injection  40 mg Subcutaneous Q24H   Continuous Infusions:   LOS: 0 days     Cordelia Poche, MD [facility] 11/03/2016, 11:58 AM Pager: [phone]  If 7PM-7AM, please contact night-coverage [email or web address] Password [email or web address] 11/03/2016, 11:58 AM

## 2016-11-03 NOTE — Consult Note (Addendum)
Neurology Consult Note  Reason for Consultation: Right-sided weakness, possible stroke  Requesting provider: Mitzi Hansen, MD  CC: Right sided weakness and numbness  HPI: This is a 51 year old right-handed man who reports that he was having lunch with his girlfriend yesterday at about 1430 when he noticed the acute onset of numbness and weakness on his right side. He states that this involved the face, arm, and leg. He denies any associated vision changes, difficulty speaking, difficulty swallowing, coordination problems, gait disturbance, or left-sided symptoms. He has never had any similar prior symptoms. He presented to the [facility] emergency department where the ED noted very subtle weakness on the right side. CT scan of the head was obtained and showed no evidence of an acute stroke or other abnormality. He had a CT angiogram of the head and neck performed which demonstrated occlusion of the right vertebral artery at its origin and additional scattered areas of atherosclerotic change with some stenosis noted in both middle cerebral arteries. He was not a candidate for thrombolytic therapy given minor deficits. He has been transferred to [facility] for further evaluation and management. Neurology consultation is requested for assistance.  The patient states he takes aspirin 2-3 times per week.  Last known well: 1430 on 11/02/16 NHISS score: 2 mRS score: 0 tPA given?: No, out of window with minor deficits   PMH:  He denies any major medical problems.  PSH:  Past Surgical History:  Procedure Laterality Date  . arthroscopic surgery      Family history: He denies any major health problems in the family.  Social history:  Social History   Social History  . Marital status: Single    Spouse name: N/A  . Number of children: N/A  . Years of education: N/A   Occupational History  . Not on file.   Social History Main Topics  . Smoking status: Current Every Day Smoker     Packs/day: 0.50    Types: Cigarettes  . Smokeless tobacco: Never Used  . Alcohol use Yes  . Drug use: No  . Sexual activity: Not on file   Other Topics Concern  . Not on file   Social History Narrative  . No narrative on file    Current inpatient meds: Medications reviewed and reconciled Current Facility-Administered Medications  Medication Dose Route Frequency Provider Last Rate Last Dose  . acetaminophen (TYLENOL) tablet 650 mg  650 mg Oral Q4H PRN Opyd, Ilene Qua, MD   650 mg at 11/03/16 1450   Or  . acetaminophen (TYLENOL) solution 650 mg  650 mg Per Tube Q4H PRN Opyd, Ilene Qua, MD       Or  . acetaminophen (TYLENOL) suppository 650 mg  650 mg Rectal Q4H PRN Opyd, Ilene Qua, MD      . aspirin suppository 300 mg  300 mg Rectal Daily Opyd, Ilene Qua, MD       Or  . aspirin tablet 325 mg  325 mg Oral Daily Opyd, Ilene Qua, MD   325 mg at 11/03/16 1053  . enoxaparin (LOVENOX) injection 40 mg  40 mg Subcutaneous Q24H Opyd, Ilene Qua, MD   40 mg at 11/02/16 1852  . senna-docusate (Senokot-S) tablet 1 tablet  1 tablet Oral QHS PRN Opyd, Ilene Qua, MD        Allergies: No Known Allergies  ROS: As per HPI. A full 14-point review of systems was performed and is otherwise notable for cramps in both flanks. 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 Remainder of review of systems is unremarkable.  PE:  BP (!) 158/92 (BP Location: Right Arm)   Pulse 74   Temp 97.9 F (36.6 C) (Oral)   Resp 18   Ht 5\' 5"  (1.651 m)   SpO2 99%   General: WDWN, no acute distress. AAO x4. Speech clear, no dysarthria. No aphasia. Follows commands briskly. Affect is bright with congruent mood. Comportment is normal.  HEENT: Normocephalic. Neck supple without LAD. MMM, OP clear. Dentition good. Sclerae anicteric. No conjunctival injection.  CV: Regular, no murmur. Carotid pulses full and symmetric, no bruits. Distal  pulses 2+ and symmetric.  Lungs: CTAB.  Abdomen: Soft, non-distended, non-tender. Bowel sounds present x4.  Extremities: No C/C/E. Neuro:  CN: Pupils are equal and round. They are symmetrically reactive from 3-->2 mm. EOMI without nystagmus. No reported diplopia. Facial sensation is decreased to light touch. Face is symmetric at rest with normal strength and mobility. Hearing is intact to conversational voice. Palate elevates symmetrically and uvula is midline. Voice is normal in tone, pitch and quality. Bilateral SCM and trapezii are 5/5. Tongue is midline with normal bulk and mobility.  Motor: Normal bulk, tone, and strength with the exception of moderate weakness in the right grip, finger extensors, and wrist extensors. He has mild weakness of right hip and knee flexion. No tremor or other abnormal movements. He has a pronator drift on the right.  Sensation: Decreased to light touch on the right.  DTRs: 2+ on the left, 3+ on the right. Toes downgoing on the left, mute on the right.   Coordination: Finger-to-nose and heel-to-shin are without dysmetria but are slower on the right. Finger taps are slow and clumsy on the right side.    Labs:  Lab Results  Component Value Date   WBC 7.0 11/02/2016   HGB 15.0 11/02/2016   HCT 44.0 11/02/2016   PLT 252 11/02/2016   GLUCOSE 106 (H) 11/02/2016   ALT 38 11/02/2016   AST 27 11/02/2016   NA 139 11/02/2016   K 3.3 (L) 11/02/2016   CL 106 11/02/2016   CREATININE 1.10 11/02/2016   BUN 25 (H) 11/02/2016   CO2 22 11/02/2016   INR 0.98 11/02/2016   Serum ethanol less than 5 Troponin 0.00 Urine drug screen positive for cocaine and THC Urinalysis negative  Imaging:  I have personally and independently reviewed the MRI scan of the brain without contrast from today. This shows an area of restricted diffusion involving the posterior limb of the left internal capsule and adjacent white matter, consistent with acute ischemic infarction. Mild to  moderate chronic small vessel ischemic changes are seen. Volumes appear normal for age. The ventricles appear normal in size and configuration. Increased signal is noted in the right vertebral artery, consistent with occlusion or low flow in that vessel.  I have personally and independently reviewed the MRA of the head from today. This shows atherosclerotic changes in the middle cerebral arteries bilaterally. This is worse on the left where there appears to be involvement of the M1 segment. The right vertebral artery appears to be occluded but is reconstituted distally.  I have personally and independently reviewed the CT angiogram of the head and neck from 11/02/16. This shows occlusion of the right vertebral artery at its origin with some filling of the distal portion of the right vertebral artery likely due to  retrograde flow. There is scattered atherosclerotic change in the intracranial vessels with mild to moderate stenosis seen in the middle cerebral arteries. No intracranial occlusions are present.  Other diagnostic studies:  TTE from today: Normal left ventricular size and function. Ejection fraction 55-60 percent. Normal wall motion. Grade 1 diastolic dysfunction. Mild dilation of the right atrium.  Assessment and Plan:  1. Acute Ischemic Stroke: This is an acute stroke involving the left internal capsule. It is most likely thrombotic in etiology. Known risk factors for cerebrovascular disease in this patient include cocaine use. CTA and MRA of the head show some mild to moderate stenosis of the left middle cerebral artery and mild stenosis of the right middle cerebral artery. There is occlusion of the right vertebral artery at its origin. The occluded vertebral has nothing to do with his current stroke. His urine drug screen was positive for cocaine and this patient have precipitated his stroke. At this point, await fasting lipids and hemoglobin a1c to complete his evaluation. Continue aspirin for  secondary stroke prevention once cleared to take oral medications. Will add atorvastatin with goal LDL less than 70. Cocaine cessation will be crucial for minimizing risk of additional strokes in the future. Ensure adequate glucose control. Allow permissive hypertension in the acute phase, treating only SBP greater than 220 mmHg and/or DBP greater than 110 mmHg. Avoid fever and hyperglycemia as these are associated with worse overall neurologic recoveries. Initiate rehab services. DVT prophylaxis as needed.   2. Right hemiparesis: This is acute, due to stroke. His arm is weaker than his leg. PT/OT/rehabilitation.  3. Right hemisensory loss: This is acute, due to stroke. PT/OT/rehabilitation.  4. Right vertebral artery occlusion: No acute intervention necessary. This is unrelated to his current stroke. He is not aware of any recent injuries to the head and neck which may have resulted in possible dissection. 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 This may suggest an element of transient brainstem ischemia. Aspirin, statin as above.  This was discussed with the patient and his family. Education was provided on the diagnosis and expected evaluation and treatment. They are in agreement with the plan as noted. They were given the opportunity to ask any questions and these were addressed to their satisfaction.   Thank you for the opportunity to participate in Mr. Cianci's care. The stroke team will assume care of the patient beginning 11/04/16. Please call if there are any urgent questions or concerns.

## 2016-11-03 NOTE — Progress Notes (Signed)
Pt c/o of generalized "spasms" in RLE and bilat flank regions. Pt requests K+ at this time, denies taking at home. Dr. Teryl Lucy notified, advised to give PRN tylenol and repeat labs in am.

## 2016-11-03 NOTE — Progress Notes (Signed)
Patient claustrophobic for MRI scan.RN calling doctor for meds to try, but patient didn't want to try at this time

## 2016-11-03 NOTE — Evaluation (Signed)
Occupational Therapy Evaluation Patient Details Name: Philip Howard MRN: [id number] DOB: 1966/02/17 Today's Date: 11/03/2016    History of Present Illness Pt is a 51 y.o. male with no significant past medical history. He presented with right-sided heaviness and numbness. UDS positive for cocaine and THC. 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   Clinical Impression   Pt presented supine in bed, pleasant and agreeable to work with therapist this session. Please see detailed performance level below and OT problem list below. At this time, feel patient has the potential to make a meaningful recovery with intensive comprehensive rehabilitation. Patient was independent and active prior to admission; patient now currently presenting with significant deficits in sensory and coordination of Right side impacting patients ability to perform ADL and functional transfers safely. Patient is motivated to return home and regain as much independence as possible. Feel intensive therapies will give patient the optimal chance to regain function and return home with his family. Family at bedside and appear extremely supportive. Will recommend CIR consult at this time. Will follow acutely.    Follow Up Recommendations  CIR;Supervision/Assistance - 24 hour    Equipment Recommendations  Other (comment) (defer to next venue)    Recommendations for Other Services Rehab consult     Precautions / Restrictions Precautions Precautions: Fall Restrictions Weight Bearing Restrictions: No      Mobility Bed Mobility Overal bed mobility: Needs Assistance Bed Mobility: Supine to Sit     Supine to sit: Min guard     General bed mobility comments: use of rails to assist, increase time required  Transfers Overall transfer level: Needs assistance Equipment  used: 1 person hand held assist Transfers: Sit to/from Stand Sit to Stand: Min assist;Mod assist         General transfer comment: Min assist to power up on 3 attempts, moderate assist on one attempt with manual placement of RUE and cues for positioning    Balance Overall balance assessment: Needs assistance   Sitting balance-Leahy Scale: Fair Sitting balance - Comments: significant LOB and inability to reposition ith dynamic sitting, but able to maintain static sitting balance. Noted poor postural alignment      Standing balance-Leahy Scale: Poor Standing balance comment: heavy reliance on therapist assist to maintain upright at this time                           ADL either performed or assessed with clinical judgement   ADL Overall ADL's : Needs assistance/impaired Eating/Feeding: Minimal assistance;Sitting Eating/Feeding Details (indicate cue type and reason): trouble gripping utensils Grooming: Wash/dry face;Sitting;Wash/dry hands;Moderate assistance (trouble keeping washcloth in R hand)   Upper Body Bathing: Sitting;Minimal assistance   Lower Body Bathing: Sitting/lateral leans;Moderate assistance   Upper Body Dressing : Sitting;Moderate assistance   Lower Body Dressing: Moderate assistance;Sit to/from stand Lower Body Dressing Details (indicate cue type and reason): able to don/doff socks, but assist needed to pull pants up while standing Toilet Transfer: Moderate assistance;Comfort height toilet;Grab bars Toilet Transfer Details (indicate cue type and reason): heavy reliance with 1 HHA with therapist and verbal cues for right leg activation Toileting- Clothing Manipulation and Hygiene: Moderate assistance   Tub/ Shower Transfer: Moderate assistance   Functional mobility during ADLs: Moderate assistance;Cueing for sequencing;Cueing for safety (1 HHA) General ADL Comments: Pt very motivated to return to PLOF  Vision Baseline Vision/History: No visual  deficits Patient Visual Report: No change from baseline Vision Assessment?: Yes Eye Alignment: Within Functional Limits Ocular Range of Motion: Within Functional Limits Alignment/Gaze Preference: Within Defined Limits Tracking/Visual Pursuits: Able to track stimulus in all quads without difficulty Saccades: Within functional limits Convergence: Within functional limits Visual Fields: No apparent deficits Additional Comments: No deficits noted     Perception     Praxis      Pertinent Vitals/Pain Pain Assessment: No/denies pain     Hand Dominance Right   Extremity/Trunk Assessment Upper Extremity Assessment Upper Extremity Assessment: RUE deficits/detail RUE Deficits / Details: grossly 3+/5; unable to write name RUE Sensation: decreased light touch;decreased proprioception RUE Coordination: decreased fine motor;decreased gross motor   Lower Extremity Assessment Lower Extremity Assessment: Defer to PT evaluation RLE Deficits / Details: gross muscle weakness RLE 4-/5 RLE Sensation: decreased light touch;decreased proprioception RLE Coordination: decreased fine motor;decreased gross motor   Cervical / Trunk Assessment Cervical / Trunk Assessment:  (some modest truncal ataxia noted )   Communication Communication Communication: No difficulties   Cognition Arousal/Alertness: Awake/alert Behavior During Therapy: WFL for tasks assessed/performed Overall Cognitive Status: Impaired/Different from baseline Area of Impairment: Safety/judgement                         Safety/Judgement: Decreased awareness of safety;Decreased awareness of deficits         General Comments  2 sons and fiance present for session    Exercises     Shoulder Instructions      Home Living Family/patient expects to be discharged to:: Inpatient rehab Living Arrangements: Spouse/significant other;Children (son with impairment who he is caregiver for) Available Help at Discharge:  Family;Available 24 hours/day Type of Home: Mobile home (double wide) Home Access: Ramped entrance   Entrance Stairs-Rails: Right;Left Home Layout: One level     Bathroom Shower/Tub: Walk-in shower;Tub/shower unit   Armed forces training and education officer: Yes How Accessible: Accessible via walker Home Equipment: Shower seat - built in   Additional Comments: son requires physical assist and he is the one usually providing it  Lives With: Family    Prior Functioning/Environment Level of Independence: Independent        Comments: working full time, 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        OT Problem List: Decreased strength;Decreased range of motion;Decreased activity tolerance;Impaired balance (sitting and/or standing);Decreased coordination;Decreased safety awareness;Decreased knowledge of use of DME or AE;Impaired sensation;Impaired UE functional use      OT Treatment/Interventions: Self-care/ADL training;Therapeutic exercise;Neuromuscular education;Energy conservation;DME and/or AE instruction;Therapeutic activities;Patient/family education;Balance training    OT Goals(Current goals can be found in the care plan section) Acute Rehab OT Goals Patient Stated Goal: to go home OT Goal Formulation: With patient/family Time For Goal Achievement: 11/17/16 Potential to Achieve Goals: Good ADL Goals Pt Will Perform Eating: with adaptive utensils;sitting;with modified independence Pt Will Perform Grooming: with min guard assist;standing Pt Will Perform Upper Body Bathing: with adaptive equipment;sitting;with min guard assist Pt Will Perform Lower Body Bathing: with min guard assist;with adaptive equipment;sitting/lateral leans Pt Will Transfer to Toilet: with modified independence;ambulating;regular height toilet Pt Will Perform Toileting - Clothing Manipulation and hygiene: sit to/from stand;with modified independence  OT Frequency: Min 3X/week   Barriers to D/C:             Co-evaluation              AM-PAC PT "6 Clicks" Daily Activity     Outcome  Measure Help from another person eating meals?: A Little Help from another person taking care of personal grooming?: A Little Help from another person toileting, which includes using toliet, bedpan, or urinal?: A Lot Help from another person bathing (including washing, rinsing, drying)?: A Lot Help from another person to put on and taking off regular upper body clothing?: A Lot Help from another person to put on and taking off regular lower body clothing?: A Lot 6 Click Score: 14   End of Session Equipment Utilized During Treatment: Gait belt Nurse Communication: Mobility status (how to assist with transfer to bathroom)  Activity Tolerance: Patient tolerated treatment well Patient left: in chair;with call bell/phone within reach;with chair alarm set;with family/visitor present  OT Visit Diagnosis: Unsteadiness on feet (R26.81);Other abnormalities of gait and mobility (R26.89);Ataxia, unspecified (R27.0);Feeding difficulties (R63.3);Hemiplegia and hemiparesis Hemiplegia - Right/Left: Right Hemiplegia - dominant/non-dominant: Dominant Hemiplegia - caused by: Cerebral infarction                Time: 1455-1514 OT Time Calculation (min): 19 min Charges:  OT General Charges $OT Visit: 1 Procedure OT Evaluation $OT Eval Moderate Complexity: 1 Procedure G-Codes:     Hulda Humphrey OTR/L [place] 11/03/2016, 5:16 PM

## 2016-11-03 NOTE — Evaluation (Signed)
Speech Language Pathology Evaluation Patient Details Name: NICHOLLAS PERUSSE MRN: [id number] DOB: 27-Apr-1966 Today's Date: 11/03/2016 Time: 0109-3235 SLP Time Calculation (min) (ACUTE ONLY): 15 min  Problem List:  Patient Active Problem List   Diagnosis Date Noted  . Acute ischemic stroke ([place]) 11/03/2016  . Numbness on right side 11/02/2016  . Hypokalemia 11/02/2016  . Substance abuse 11/02/2016  . 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 Stroke [facility]) 11/02/2016   Past Medical History: History reviewed. No pertinent past medical history. Past Surgical History:  Past Surgical History:  Procedure Laterality Date  . arthroscopic surgery     HPI:  51 y.o.malewho denies any significant past medical history, but also does not see a physician regularly, now presenting to the emergency department with acute onset of right-sided numbness. MRI revealed acute/subacute nonhemorrhagic infarct involving the posterior limb of the left internal capsule. UDS positive for cocaine and THC.   Assessment / Plan / Recommendation Clinical Impression  Pt presents with normal speech and language; fluent output.  No dysarthria.  Mild difficulty with word list recall - pt asserts performance consistent with function PTA.  No overt cognitive/language deficits per testing, and none anticipated given site of lesion.  No acute SLP needs identified.  If OT/PT observe deficits during f/u sessions, recommend further assessment in next venue of care.     SLP Assessment  SLP Recommendation/Assessment: Patient does not need any further Speech Lanaguage Pathology Services    Follow Up Recommendations  None    Frequency and Duration           SLP Evaluation Cognition  Overall Cognitive Status: Impaired/Different from baseline Arousal/Alertness: Awake/alert Orientation Level: Oriented X4 Attention: Sustained Sustained Attention: Appears intact Memory:  (mild deficits short-term  recall) Safety/Judgment: Appears intact       Comprehension  Auditory Comprehension Overall Auditory Comprehension: Appears within functional limits for tasks assessed Visual Recognition/Discrimination Discrimination: Within Function Limits Reading Comprehension Reading Status: Not tested    Expression Expression Primary Mode of Expression: Verbal Verbal Expression Overall Verbal Expression: Appears within functional limits for tasks assessed Written Expression Dominant Hand: Right   Oral / Motor  Motor Speech Overall Motor Speech: Appears within functional limits for tasks assessed   GO                    Juan Quam Laurice 11/03/2016, 3:51 PM  Maleea Camilo L. Tivis Ringer, [place] CCC/SLP Pager [phone]

## 2016-11-03 NOTE — Progress Notes (Signed)
  Echocardiogram 2D Echocardiogram has been performed.  Revella Shelton L Androw 11/03/2016, 10:37 AM

## 2016-11-03 NOTE — Evaluation (Signed)
Physical Therapy Evaluation Patient Details Name: Philip Howard MRN: [id number] DOB: 1966-04-28 Today's Date: 11/03/2016   History of Present Illness  Pt is a 51 y.o. male with no significant past medical history. He presented with right-sided heaviness and numbness. UDS positive for cocaine and THC. 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  Clinical Impression  Orders received for PT evaluation. Patient demonstrates deficits in functional mobility as indicated below. Will benefit from continued skilled PT to address deficits and maximize function. Will see as indicated and progress as tolerated.  At this time, feel patient has the potential to make a meaningful recovery with intensive comprehensive rehabilitation. Patient was independent and active prior to admission; patient now currently presenting with significant deficits in sensory and coordination of Right side impacting patients ability to mobilize safely. Patient is motivated to return home. Feel intensive therapies will give patient the optimal chance to regain function and return home with his family. Family at bedside and appear extremely supportive. Will recommend CIR consult at this time. Will follow acutely.    Follow Up Recommendations CIR    Equipment Recommendations  Other (comment) (TBD)    Recommendations for Other Services Rehab consult     Precautions / Restrictions Precautions Precautions: Fall      Mobility  Bed Mobility               General bed mobility comments: received EOB with OT therapist  Transfers Overall transfer level: Needs assistance Equipment used: 1 person hand held assist Transfers: Sit to/from Stand Sit to Stand: Min assist         General transfer comment: Min assist to power up on 3 attempts, moderate assist on one attempt  with manual placement of RUE and cues for positioning  Ambulation/Gait Ambulation/Gait assistance: Mod assist Ambulation Distance (Feet): 15 Feet (x3) Assistive device: 1 person hand held assist (wrap around support with gait belt) Gait Pattern/deviations: Step-to pattern;Decreased dorsiflexion - right;Ataxic;Trunk flexed Gait velocity: decreased   General Gait Details: patient with inability to coordinate step with RLE. Performed gait retraining with multi modal cues for quat set and placement of R limb during weight bearing and loading response. Neuro-faciliatation of Upright posture and manual weight shift to initiate swing. Poor ability for patient to maintain consistently.  Stairs            Wheelchair Mobility    Modified Rankin (Stroke Patients Only) Modified Rankin (Stroke Patients Only) Pre-Morbid Rankin Score: No symptoms Modified Rankin: Moderately severe disability     Balance Overall balance assessment: Needs assistance   Sitting balance-Leahy Scale: Fair Sitting balance - Comments: significant LOB and inability to reposition ith dynamic sitting, but able to maintain static sitting balance. Noted poor postural alignment      Standing balance-Leahy Scale: Poor Standing balance comment: heavy reliance on therapist assist to maintain upright at this time                             Pertinent Vitals/Pain Pain Assessment: No/denies pain    Home Living Family/patient expects to be discharged to:: Inpatient rehab Living Arrangements: Spouse/significant other Available Help at Discharge: Family                  Prior Function Level of Independence: Independent  Hand Dominance   Dominant Hand: Right    Extremity/Trunk Assessment   Upper Extremity Assessment Upper Extremity Assessment: Defer to OT evaluation    Lower Extremity Assessment Lower Extremity Assessment: RLE deficits/detail RLE Deficits / Details: gross  muscle weakness RLE 4-/5 RLE Sensation: decreased light touch;decreased proprioception RLE Coordination: decreased fine motor;decreased gross motor    Cervical / Trunk Assessment Cervical / Trunk Assessment:  (some modest truncal ataxia noted )  Communication   Communication: No difficulties  Cognition Arousal/Alertness: Awake/alert Behavior During Therapy: WFL for tasks assessed/performed Overall Cognitive Status: Impaired/Different from baseline Area of Impairment: Safety/judgement                         Safety/Judgement: Decreased awareness of safety;Decreased awareness of deficits            General Comments      Exercises     Assessment/Plan    PT Assessment Patient needs continued PT services  PT Problem List Decreased strength;Decreased balance;Decreased activity tolerance;Decreased coordination;Decreased mobility;Impaired sensation       PT Treatment Interventions DME instruction;Gait training;Stair training;Functional mobility training;Therapeutic activities;Therapeutic exercise;Balance training;Patient/family education    PT Goals (Current goals can be found in the Care Plan section)  Acute Rehab PT Goals Patient Stated Goal: to go home PT Goal Formulation: With patient/family Time For Goal Achievement: 11/17/16 Potential to Achieve Goals: Good    Frequency Min 4X/week   Barriers to discharge        Co-evaluation               AM-PAC PT "6 Clicks" Daily Activity  Outcome Measure Difficulty turning over in bed (including adjusting bedclothes, sheets and blankets)?: A Lot Difficulty moving from lying on back to sitting on the side of the bed? : A Lot Difficulty sitting down on and standing up from a chair with arms (e.g., wheelchair, bedside commode, etc,.)?: A Lot Help needed moving to and from a bed to chair (including a wheelchair)?: A Lot Help needed walking in hospital room?: A Lot Help needed climbing 3-5 steps with a railing? :  Total 6 Click Score: 11    End of Session Equipment Utilized During Treatment: Gait belt Activity Tolerance: Patient tolerated treatment well Patient left: in chair;with call bell/phone within reach;with chair alarm set;with family/visitor present Nurse Communication: Mobility status PT Visit Diagnosis: Difficulty in walking, not elsewhere classified (R26.2);Hemiplegia and hemiparesis Hemiplegia - Right/Left: Right Hemiplegia - dominant/non-dominant: Dominant Hemiplegia - caused by: Cerebral infarction    Time: 6144-3154 PT Time Calculation (min) (ACUTE ONLY): 21 min   Charges:   PT Evaluation $PT Eval Moderate Complexity: 1 Procedure     PT G Codes:        Alben Deeds, PT DPT  [phone]   Duncan Dull 11/03/2016, 3:47 PM

## 2016-11-04 ENCOUNTER — Inpatient Hospital Stay (HOSPITAL_COMMUNITY): Payer: Self-pay

## 2016-11-04 DIAGNOSIS — R29898 Other symptoms and signs involving the musculoskeletal system: Secondary | ICD-10-CM

## 2016-11-04 DIAGNOSIS — E785 Hyperlipidemia, unspecified: Secondary | ICD-10-CM

## 2016-11-04 DIAGNOSIS — F17200 Nicotine dependence, unspecified, uncomplicated: Secondary | ICD-10-CM

## 2016-11-04 DIAGNOSIS — F1010 Alcohol abuse, uncomplicated: Secondary | ICD-10-CM

## 2016-11-04 LAB — LIPID PANEL
Cholesterol: 283 mg/dL — ABNORMAL HIGH (ref 0–200)
HDL: 39 mg/dL — AB (ref >40)
LDL CALC: 166 mg/dL — AB (ref 0–99)
TRIGLYCERIDES: 388 mg/dL — AB (ref <150)
Total CHOL/HDL Ratio: 7.3 RATIO
VLDL: 78 mg/dL — AB (ref 0–40)

## 2016-11-04 LAB — BASIC METABOLIC PANEL
Anion gap: 10 (ref 5–15)
BUN: 11 mg/dL (ref 6–20)
CO2: 21 mmol/L — AB (ref 22–32)
Calcium: 9.3 mg/dL (ref 8.9–10.3)
Chloride: 104 mmol/L (ref 101–111)
Creatinine, Ser: 1.01 mg/dL (ref 0.61–1.24)
GFR calc non Af Amer: >60 mL/min (ref >60)
GLUCOSE: 122 mg/dL — AB (ref 65–99)
Potassium: 3.5 mmol/L (ref 3.5–5.1)
Sodium: 135 mmol/L (ref 135–145)

## 2016-11-04 LAB — HEMOGLOBIN A1C
HEMOGLOBIN A1C: 5.7 % — AB (ref 4.8–5.6)
Mean Plasma Glucose: 117 mg/dL

## 2016-11-04 MED ORDER — ATORVASTATIN CALCIUM 40 MG PO TABS
40.0000 mg | ORAL_TABLET | Freq: Every day | ORAL | Status: DC
  Administered 2016-11-04: 40 mg via ORAL
  Filled 2016-11-04: qty 1

## 2016-11-04 MED ORDER — METHOCARBAMOL 500 MG PO TABS
500.0000 mg | ORAL_TABLET | Freq: Three times a day (TID) | ORAL | Status: DC | PRN
  Administered 2016-11-04 – 2016-11-05 (×4): 500 mg via ORAL
  Filled 2016-11-04 (×4): qty 1

## 2016-11-04 NOTE — Progress Notes (Signed)
Physical Therapy Treatment Patient Details Name: Philip Howard MRN: [id number] DOB: March 03, 1966 Today's Date: 11/04/2016    History of Present Illness Pt is a 51 y.o. male with no significant past medical history. He presented with right-sided heaviness and numbness. UDS positive for cocaine and THC. 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    PT Comments    Pt continues to demonstrate poor coordination with RLE and decreased weight shift onto LLE during gait. PT provided mod A and manual facilitation at the pelvis to promote trunk extension and increased weight shift toward the LLE. VCs required throughout to decrease trunk flexion, contract R quad prior to R stance, and increase RLE stride length. Pt completed multiple gait trials of up to 76ft with pt demonstrating increased gait deviations after 8-10 steps. After multiple trials pt demonstrated improved coordination with ability to increase gait distance (up to 66ft) before decreased quality of gait. Pt would benefit from continued skilled PT to improve gait and functional mobility and return to independence. Current plan remains appropriate.   Follow Up Recommendations  CIR     Equipment Recommendations       Recommendations for Other Services Rehab consult     Precautions / Restrictions Precautions Precautions: Fall Restrictions Weight Bearing Restrictions: No    Mobility  Bed Mobility Overal bed mobility: Needs Assistance Bed Mobility: Supine to Sit;Sit to Supine     Supine to sit: Supervision Sit to supine: Supervision   General bed mobility comments: used bed rails for sit <> supine  Transfers Overall transfer level: Needs assistance Equipment used: None Transfers: Sit to/from Stand Sit to Stand: Min assist         General transfer comment: Min A required  to maintain balance upon standing   Ambulation/Gait Ambulation/Gait assistance: Mod assist Ambulation Distance (Feet): 20 Feet (x6) Assistive device: 1 person hand held assist (wrap around support with gait belt) Gait Pattern/deviations: Step-to pattern;Decreased stride length;Decreased dorsiflexion - right;Ataxic;Trunk flexed Gait velocity: decreased Gait velocity interpretation: Below normal speed for age/gender General Gait Details: Pt presented with poor step cooridnation with RLE and decreased weight shifting to the LLE. PT provided manual facilitation at the pelvis to promote trunk extension and increased weight shift toward LLE during R swing phase. Pt completed multiple trials of up to 37ft of gait training Verbal cues provided to contract R quad prior to RLE stance to improve control. Pt able to complete 8-10 steps of controlled gait before demonstrating decreased coordination and control and required mod A from PT to improve stability. PT provided cues to stop and rest throughout with increased gait deviations to prevent reinforcement of incorrect gait pattern. Pt demonstrated improvedment with multiple gait trials with ability to ambulate 83ft while maintaining steady gait.   Stairs            Wheelchair Mobility    Modified Rankin (Stroke Patients Only) Modified Rankin (Stroke Patients Only) Pre-Morbid Rankin Score: No symptoms Modified Rankin: Moderately severe disability     Balance Overall balance assessment: Needs assistance Sitting-balance support: No upper extremity supported;Feet supported Sitting balance-Leahy Scale: Fair       Standing balance-Leahy Scale: Poor Standing balance comment: Pt required min A to maintain static standing and min A during gait to improve instability  Cognition Arousal/Alertness: Awake/alert Behavior During Therapy: WFL for tasks assessed/performed Overall Cognitive Status: Impaired/Different  from baseline Area of Impairment: Safety/judgement                         Safety/Judgement: Decreased awareness of safety;Decreased awareness of deficits            Exercises      General Comments General comments (skin integrity, edema, etc.): 3 sisters and son present in room during session       Pertinent Vitals/Pain Pain Assessment: No/denies pain    Home Living                      Prior Function            PT Goals (current goals can now be found in the care plan section) Acute Rehab PT Goals Patient Stated Goal: to go home PT Goal Formulation: With patient Time For Goal Achievement: 11/17/16 Potential to Achieve Goals: Good Progress towards PT goals: Progressing toward goals    Frequency    Min 4X/week      PT Plan Current plan remains appropriate    Co-evaluation              AM-PAC PT "6 Clicks" Daily Activity  Outcome Measure  Difficulty turning over in bed (including adjusting bedclothes, sheets and blankets)?: A Lot Difficulty moving from lying on back to sitting on the side of the bed? : A Lot Difficulty sitting down on and standing up from a chair with arms (e.g., wheelchair, bedside commode, etc,.)?: A Little Help needed moving to and from a bed to chair (including a wheelchair)?: A Lot Help needed walking in hospital room?: A Lot Help needed climbing 3-5 steps with a railing? : Total 6 Click Score: 12    End of Session Equipment Utilized During Treatment: Gait belt Activity Tolerance: Patient tolerated treatment well Patient left: in bed;with call bell/phone within reach;with bed alarm set;with family/visitor present Nurse Communication: Mobility status PT Visit Diagnosis: Difficulty in walking, not elsewhere classified (R26.2);Hemiplegia and hemiparesis Hemiplegia - Right/Left: Right Hemiplegia - dominant/non-dominant: Dominant Hemiplegia - caused by: Cerebral infarction     Time: 3570-1779 PT Time  Calculation (min) (ACUTE ONLY): 18 min  Charges:  $Gait Training: 8-22 mins                    G Codes:       Loma Sousa, SPT  [phone]   Loma Sousa 11/04/2016, 3:19 PM

## 2016-11-04 NOTE — Progress Notes (Signed)
Patient is complaining of worsening right arm numbness. Neurology contacted. New order given to have a STAT CT with contrast. Will carry out orders and notify oncoming nurse.

## 2016-11-04 NOTE — Plan of Care (Signed)
Problem: Self-Care: Goal: Ability to participate in self-care as condition permits will improve Outcome: Progressing Patient able to bath self. However, patient is none compliant with asking staff for help.  Goal: Verbalization of feelings and concerns over difficulty with self-care will improve Outcome: Progressing Patient encouraged to ask nursing staff for assistance with adl's and getting out of bed Goal: Ability to communicate needs accurately will improve Outcome: Progressing Patient encouraged to ask nursing staff for help. Patient noncompliant. Patient needs reinforcement.

## 2016-11-04 NOTE — Progress Notes (Signed)
STROKE TEAM PROGRESS NOTE   HISTORY OF PRESENT ILLNESS (per record) This is a 51 year old right-handed man who reports that he was having lunch with his girlfriend yesterday at about 1430 when he noticed the acute onset of numbness and weakness on his right side. He states that this involved the face, arm, and leg. He denies any associated vision changes, difficulty speaking, difficulty swallowing, coordination problems, gait disturbance, or left-sided symptoms. He has never had any similar prior symptoms. He presented to the [facility] emergency department where the ED noted very subtle weakness on the right side. CT scan of the head was obtained and showed no evidence of an acute stroke or other abnormality. He had a CT angiogram of the head and neck performed which demonstrated occlusion of the right vertebral artery at its origin and additional scattered areas of atherosclerotic change with some stenosis noted in both middle cerebral arteries. He was not a candidate for thrombolytic therapy given minor deficits. He has been transferred to [facility] for further evaluation and management. Neurology consultation is requested for assistance.  The patient states he takes aspirin 2-3 times per week.  Last known well: 1430 on 11/02/16 NHISS score: 2 mRS score: 0 tPA given?: No, out of window with minor deficits    SUBJECTIVE (INTERVAL HISTORY) His son and other family members are at the bedside.  He still has mild right Hemiparesis and hemiparesthesia. He admitted smoking and substance use at home.   OBJECTIVE Temp:  [97.5 F (36.4 C)-98.2 F (36.8 C)] 97.8 F (36.6 C) (05/21 0643) Pulse Rate:  [57-80] 57 (05/21 0643) Cardiac Rhythm: Normal sinus rhythm (05/20 1900) Resp:  [18-20] 20 (05/21 0643) BP: (115-158)/(64-108) 135/82 (05/21 0643) SpO2:  [98 %-100 %] 100 % (05/21 0643)  CBC:  Recent Labs Lab 11/02/16 1510 11/02/16 1534  WBC 7.0  --   NEUTROABS 3.7  --   HGB 13.8  15.0  HCT 41.1 44.0  MCV 91.9  --   PLT 252  --     Basic Metabolic Panel:  Recent Labs Lab 11/02/16 1518 11/02/16 1534 11/04/16 0349  NA 137 139 135  K 3.3* 3.3* 3.5  CL 107 106 104  CO2 22  --  21*  GLUCOSE 107* 106* 122*  BUN 24* 25* 11  CREATININE 1.18 1.10 1.01  CALCIUM 9.2  --  9.3    Lipid Panel:    Component Value Date/Time   CHOL 283 (H) 11/04/2016 0349   TRIG 388 (H) 11/04/2016 0349   HDL 39 (L) 11/04/2016 0349   CHOLHDL 7.3 11/04/2016 0349   VLDL 78 (H) 11/04/2016 0349   LDLCALC 166 (H) 11/04/2016 0349   HgbA1c:  Lab Results  Component Value Date   HGBA1C 5.7 (H) 11/03/2016   Urine Drug Screen:    Component Value Date/Time   LABOPIA NONE DETECTED 11/02/2016 1611   COCAINSCRNUR POSITIVE (A) 11/02/2016 1611   LABBENZ NONE DETECTED 11/02/2016 1611   AMPHETMU NONE DETECTED 11/02/2016 1611   THCU POSITIVE (A) 11/02/2016 1611   LABBARB NONE DETECTED 11/02/2016 1611    Alcohol Level     Component Value Date/Time   ETH <5 11/02/2016 1518    IMAGING I have personally reviewed the radiological images below and agree with the radiology interpretations.  Ct Angio Head W Or Wo Contrast Ct Angio Neck W And/or Wo Contrast 11/02/2016  CTA NECK:  Occluded RIGHT vertebral artery without reconstitution in the neck, likely acute. Otherwise negative CTA neck.  CTA HEAD:  No emergent large vessel occlusion or severe stenosis. Retrograde flow to distal RIGHT V4 segment. Intracranial atherosclerosis resulting in mild-to-moderate stenoses of the bilateral proximal middle cerebral arteries.   Mr [name] Head/brain Wo Cm 11/03/2016 1. 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  2. Scattered subcortical T2 hyperintensities bilaterally are mildly advanced for age. These are nonspecific. Given the other ischemic disease, this likely reflects remote ischemia as well.  3. Occlusion of the right vertebral artery with  reconstitution of the distal segment.  4. Asymmetric atherosclerotic disease involving distal left M1 segment and left greater than right MCA branch vessels.  Ct Head Code Stroke Wo Contrast` 11/02/2016 1. Negative CT of the head  2. ASPECTS is 10/10   Transthoracic Echocardiogram 11/03/2016 Study Conclusion: - Left ventricle: The cavity size was normal. Wall thickness was   normal. Systolic function was normal. The estimated ejection   fraction was in the range of 55% to 60%. Wall motion was normal;   there were no regional wall motion abnormalities. Doppler   parameters are consistent with abnormal left ventricular   relaxation (grade 1 diastolic dysfunction). - Right atrium: The atrium was mildly dilated. Impressions: - Normal LV systolic function; mild diastolic dysfunction; mild   RAE/RVE.    PHYSICAL EXAM  Temp:  [97.5 F (36.4 C)-98.2 F (36.8 C)] 97.8 F (36.6 C) (05/21 0643) Pulse Rate:  [55-73] 55 (05/21 1100) Resp:  [20] 20 (05/21 1100) BP: (115-149)/(64-94) 128/84 (05/21 1100) SpO2:  [98 %-100 %] 100 % (05/21 1100)  General - Well nourished, well developed, in no apparent distress.  Ophthalmologic - Sharp disc margins OU.   Cardiovascular - Regular rate and rhythm.  Mental Status -  Level of arousal and orientation to time, place, and person were intact. Language including expression, naming, repetition, comprehension was assessed and found intact. Fund of Knowledge was assessed and was intact.  Cranial Nerves II - XII - II - Visual field intact OU. III, IV, VI - Extraocular movements intact. V - Facial sensation decreased on the right, about 30% of the left. VII - Facial movement intact bilaterally. VIII - Hearing & vestibular intact bilaterally. X - Palate elevates symmetrically. XI - Chin turning & shoulder shrug intact bilaterally. XII - Tongue protrusion intact.  Motor Strength - The patient's strength was 4/5 right UE with pronator drift and  dexterity difficulties, 5/5 right LE proximal, 4/5 distal.  Bulk was normal and fasciculations were absent.   Motor Tone - Muscle tone was assessed at the neck and appendages and was normal.  Reflexes - The patient's reflexes were 1+ in all extremities and he had no pathological reflexes.  Sensory - Light touch, temperature/pinprick decreased on the right, about 50% of left.    Coordination - The patient had normal movements in the hands with no ataxia or dysmetria.  Tremor was absent.  Gait and Station - deferred.   ASSESSMENT/PLAN Philip Howard is a 51 y.o. male with history of tobacco use presenting with acute onset of numbness and weakness on his right side. He did not receive IV t-PA due to late presentation and minimal deficits.  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 Still most likely small vessel disease due to multiple risk factors including smoking, cocaine use, heavy alcohol use and hyperlipidemia, however embolic stroke can not rule out as AchA is a direct from ICA and pt does have intermittent palpitation.  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   MRA head - Occlusion of the right VA with reconstitution of the distal segment.  2D Echo - EF 55-60%. No cardiac source of emboli identified.  Recommend 30 day cardiac event monitoring as outpatient to rule out A. fib  LDL - 166  HgbA1c - 5.7  VTE prophylaxis - Lovenox  Diet Heart Room service appropriate? Yes; Fluid consistency: Thin  No antithrombotic prior to admission, now on aspirin 325 mg daily. Continue aspirin on discharge.  Patient counseled to be compliant with his antithrombotic medications  Ongoing aggressive stroke risk factor management  Therapy recommendations: CIR recommended  Disposition:  Pending  Heart palpitation  Intermittent heart palpitation at home  Recommend 30 day cardiac event monitoring as outpatient to rule out A. fib  Hypertension  Stable  Permissive hypertension (OK if < 220/120) but gradually normalize in 5-7 days  Long-term BP goal normotensive  Hyperlipidemia  Home meds:  No lipid lowering medications prior to admission  LDL 166, goal < 70  Now on Lipitor 40 mg daily  Continue statin at discharge  Tobacco abuse  Current smoker  Smoking cessation counseling provided  Pt is willing to quit  Cocaine abuse  UDS showed positive cocaine  Cocaine cessation counseling provided  Patient willing to quit  Alcohol abuse  3-4 drinks every day currently  Recommend drink no more than 2 drinks per day  Patient agrees  Other Stroke Risk Factors  UDS positive for THC  Other Active Problems  Mild hyperglycemia  Hypokalemia - corrected  Hospital day # 1  Neurology will sign off. Please call with questions. Pt will follow up with Philip Rubin NP at [facility] in about 6 weeks. Thanks for the consult.  Rosalin Hawking, MD PhD Stroke Neurology 11/04/2016 5:20 PM    To contact Stroke Continuity provider, please refer to [email or web address] After hours, contact General Neurology

## 2016-11-04 NOTE — Progress Notes (Signed)
CSW consulted to pursue SNF placement as another option for CIR placement. CSW explained to pt and family members that CIR was recommended, but SNF is another option for rehab. Pt and family refused option to pursue SNF; if CIR doesn't accept, want to discuss going home with home health or outpatient PT services instead.  CSW no longer needed for discharge planning. CSW signing off.  Laveda Abbe LCSW [phone]

## 2016-11-04 NOTE — Progress Notes (Signed)
PROGRESS NOTE    Philip Howard  HQI:[id number] DOB: [birth year] DOA: 11/02/2016 PCP: Patient, No Pcp Per   Brief Narrative: Philip Howard is a 51 y.o. male with no significant past medical history. He presented with right-sided heaviness and numbness. Concern for acute CVA with evidence of right vertebral artery occlusion and mild to moderate stenosis of bilateral middle cerebral arteries. MRI is pending. UDS positive for cocaine and THC.   Assessment & Plan:   Principal Problem:   Numbness on right side Active Problems:   Hypokalemia   Substance abuse   Vertebral artery occlusion, right   Stroke (HCC)   Acute ischemic stroke (HCC)   Right sided numbness Likely secondary to stroke. MRI significant for Acute/subacute internal capsule infarct with atherosclerotic disease of distal left M1 and L>R MCA branch vessels. CTA of head and neck with occlusion of right vertebral artery and mild to moderate stenosis of bilateral middle cerebral arteries. Patient with positive cocaine, which possibly could have contributed (patient denies use). Echocardiogram significant for grade 1 diastolic dysfunction). -Neurology recommendations -Echocardiogram pending -Lipid panel (not obtained this AM) -continue Aspirin -PT/OT: CIR  Right vertebral artery occlusion -neurology recommendations  Elevated BP Normotensive -Permissive hypertension  Hypokalemia Resolved. -given supplementation.  Substance abuse UDS positive for cocaine and THC. Discussed with patient that use should cease as it could be contributory. Patient adamantly denies cocaine use but states he does use marijuana on a daily basis.  DVT prophylaxis: Lovenox Code Status: Full code Family Communication: Fiance at bedside Disposition Plan: Discharge likely in 24 hours   Consultants:   Neurology  Procedures:   Echocardiogram (5/20): EF of 55-60% with grade 1 diastolic dysfunction  Antimicrobials:  None     Subjective: Worsening symptoms of right arm numbness and weakness. Some body cramping.  Objective: Vitals:   11/04/16 0131 11/04/16 0546 11/04/16 0643 11/04/16 1100  BP: 119/64 115/82 135/82 128/84  Pulse: 73 62 (!) 57 (!) 55  Resp: 20 20 20 20   Temp: 98 F (36.7 C) 98 F (36.7 C) 97.8 F (36.6 C)   TempSrc: Oral Oral Oral Oral  SpO2: 99% 100% 100% 100%  Height:       No intake or output data in the 24 hours ending 11/04/16 1127 There were no vitals filed for this visit.  Examination:  General exam: Appears calm and comfortable  Respiratory system: Clear to auscultation. Respiratory effort normal. Cardiovascular system: S1 & S2 heard, RRR. No murmurs. Gastrointestinal system: Abdomen is nondistended, soft and nontender. No organomegaly or masses felt. Normal bowel sounds heard. Central nervous system: Alert and oriented. Right sided numbness. 4/5 upper and lower extremity strength of right side compared to left at 5/5 strength. Extremities: No edema. No calf tenderness Skin: No cyanosis. No rashes Psychiatry: Judgement and insight appear normal. Mood & affect appropriate.     Data Reviewed: I have personally reviewed following labs and imaging studies  CBC:  Recent Labs Lab 11/02/16 1510 11/02/16 1534  WBC 7.0  --   NEUTROABS 3.7  --   HGB 13.8 15.0  HCT 41.1 44.0  MCV 91.9  --   PLT 252  --    Basic Metabolic Panel:  Recent Labs Lab 11/02/16 1518 11/02/16 1534 11/04/16 0349  NA 137 139 135  K 3.3* 3.3* 3.5  CL 107 106 104  CO2 22  --  21*  GLUCOSE 107* 106* 122*  BUN 24* 25* 11  CREATININE 1.18 1.10 1.01  CALCIUM 9.2  --  9.3   GFR: CrCl cannot be calculated (Unknown ideal weight.). Liver Function Tests:  Recent Labs Lab 11/02/16 1518  AST 27  ALT 38  ALKPHOS 54  BILITOT 0.4  PROT 6.6  ALBUMIN 3.9   No results for input(s): LIPASE, AMYLASE in the last 168 hours. No results for input(s): AMMONIA in the last 168 hours. Coagulation  Profile:  Recent Labs Lab 11/02/16 1518  INR 0.98   Cardiac Enzymes: No results for input(s): CKTOTAL, CKMB, CKMBINDEX, TROPONINI in the last 168 hours. BNP (last 3 results) No results for input(s): PROBNP in the last 8760 hours. HbA1C:  Recent Labs  11/03/16 1224  HGBA1C 5.7*   CBG:  Recent Labs Lab 11/02/16 1520  GLUCAP 111*   Lipid Profile:  Recent Labs  11/04/16 0349  CHOL 283*  HDL 39*  LDLCALC 166*  TRIG 388*  CHOLHDL 7.3   Thyroid Function Tests: No results for input(s): TSH, T4TOTAL, FREET4, T3FREE, THYROIDAB in the last 72 hours. Anemia Panel: No results for input(s): VITAMINB12, FOLATE, FERRITIN, TIBC, IRON, RETICCTPCT in the last 72 hours. Sepsis Labs: No results for input(s): PROCALCITON, LATICACIDVEN in the last 168 hours.  No results found for this or any previous visit (from the past 240 hour(s)).       Radiology Studies: Ct Angio Head W Or Wo Contrast  Result Date: 11/02/2016 CLINICAL DATA:  RIGHT-sided weakness at lunch today. Assess for stroke. EXAM: CT ANGIOGRAPHY HEAD AND NECK TECHNIQUE: Multidetector CT imaging of the head and neck was performed using the standard protocol during bolus administration of intravenous contrast. Multiplanar CT image reconstructions and MIPs were obtained to evaluate the vascular anatomy. Carotid stenosis measurements (when applicable) are obtained utilizing NASCET criteria, using the distal internal carotid diameter as the denominator. CONTRAST:  80 cc Isovue 370 COMPARISON:  CT HEAD Nov 02, 2016 at 1511 hours FINDINGS: CTA NECK AORTIC ARCH: Normal appearance of the thoracic arch, 2 vessel arch is a normal variant. Trace calcific atherosclerosis of aortic arch. The origins of the innominate, left Common carotid artery and subclavian artery are widely patent. RIGHT CAROTID SYSTEM: Common carotid artery is widely patent. Normal appearance of the carotid bifurcation without hemodynamically significant stenosis by  NASCET criteria. Normal appearance of the included internal carotid artery. LEFT CAROTID SYSTEM: Common carotid artery is widely patent. Normal appearance of the carotid bifurcation without hemodynamically significant stenosis by NASCET criteria. Normal appearance of the included internal carotid artery. VERTEBRAL ARTERIES:RIGHT vertebral artery is occluded from the origin distally without reconstitution in the neck. LEFT vertebral artery is widely patent normal in appearance. SKELETON: No acute osseous process though bone windows have not been submitted. Patient is edentulous. Calcified bilateral stylohyoid ligaments. OTHER NECK: Soft tissues of the neck are non-acute though, not tailored for evaluation. Subcentimeter nonspecific mediastinal lymph nodes may be reactive. CTA HEAD ANTERIOR CIRCULATION: Patent cervical internal carotid arteries, petrous, cavernous and supra clinoid internal carotid arteries. Widely patent anterior communicating artery. Patent anterior and middle cerebral arteries, mild to moderate tandem stenoses regularity proximal bilateral middle cerebral artery is. No large vessel occlusion, hemodynamically significant stenosis, dissection, contrast extravasation or aneurysm. POSTERIOR CIRCULATION: Retrograde flow of distal RIGHT V4 segment. Mild luminal irregularity basilar artery and posterior cerebral arteries. LEFT V4 segment is widely patent. Normal appearance of the basilar artery main branch vessels. Predominately fetal origin of RIGHT posterior cerebral artery. No hemodynamically significant stenosis, dissection, contrast extravasation or aneurysm. VENOUS SINUSES: Major dural venous sinuses are patent though not tailored for evaluation  on this angiographic examination. ANATOMIC VARIANTS: None. DELAYED PHASE: No abnormal intracranial enhancement. MIP images reviewed. IMPRESSION: CTA NECK: Occluded RIGHT vertebral artery without reconstitution in the neck, likely acute. Otherwise negative  CTA NECK. CTA HEAD: No emergent large vessel occlusion or severe stenosis. Retrograde flow to distal RIGHT V4 segment. Intracranial atherosclerosis resulting in mild-to-moderate stenoses of the bilateral proximal middle cerebral arteries. Critical Value/emergent results were called by telephone at the time of interpretation on 11/02/2016 at 5:11 pm to Dr. Nanda Quinton , who verbally acknowledged these results. Electronically Signed   By: Elon Alas M.D.   On: 11/02/2016 17:14   Ct Head W Contrast  Result Date: 11/04/2016 CLINICAL DATA:  Right-sided weakness EXAM: CT HEAD WITHOUT CONTRAST TECHNIQUE: Contiguous axial images were obtained from the base of the skull through the vertex without intravenous contrast. COMPARISON:  Two days ago. FINDINGS: Brain: Acute infarct in the left basal ganglia and corona radiata shows no evidence of progression or hemorrhage. No evidence of new infarct. No hydrocephalus or mass effect. Vascular: No hyperdense vessel. Skull: No acute or aggressive finding. Sinuses/Orbits: Negative IMPRESSION: Known acute infarct in the left basal ganglia and corona radiata. No evidence of progression. No hemorrhagic conversion. Electronically Signed   By: Monte Fantasia M.D.   On: 11/04/2016 08:20   Ct Angio Neck W And/or Wo Contrast  Result Date: 11/02/2016 CLINICAL DATA:  RIGHT-sided weakness at lunch today. Assess for stroke. EXAM: CT ANGIOGRAPHY HEAD AND NECK TECHNIQUE: Multidetector CT imaging of the head and neck was performed using the standard protocol during bolus administration of intravenous contrast. Multiplanar CT image reconstructions and MIPs were obtained to evaluate the vascular anatomy. Carotid stenosis measurements (when applicable) are obtained utilizing NASCET criteria, using the distal internal carotid diameter as the denominator. CONTRAST:  80 cc Isovue 370 COMPARISON:  CT HEAD Nov 02, 2016 at 1511 hours FINDINGS: CTA NECK AORTIC ARCH: Normal appearance of the  thoracic arch, 2 vessel arch is a normal variant. Trace calcific atherosclerosis of aortic arch. The origins of the innominate, left Common carotid artery and subclavian artery are widely patent. RIGHT CAROTID SYSTEM: Common carotid artery is widely patent. Normal appearance of the carotid bifurcation without hemodynamically significant stenosis by NASCET criteria. Normal appearance of the included internal carotid artery. LEFT CAROTID SYSTEM: Common carotid artery is widely patent. Normal appearance of the carotid bifurcation without hemodynamically significant stenosis by NASCET criteria. Normal appearance of the included internal carotid artery. VERTEBRAL ARTERIES:RIGHT vertebral artery is occluded from the origin distally without reconstitution in the neck. LEFT vertebral artery is widely patent normal in appearance. SKELETON: No acute osseous process though bone windows have not been submitted. Patient is edentulous. Calcified bilateral stylohyoid ligaments. OTHER NECK: Soft tissues of the neck are non-acute though, not tailored for evaluation. Subcentimeter nonspecific mediastinal lymph nodes may be reactive. CTA HEAD ANTERIOR CIRCULATION: Patent cervical internal carotid arteries, petrous, cavernous and supra clinoid internal carotid arteries. Widely patent anterior communicating artery. Patent anterior and middle cerebral arteries, mild to moderate tandem stenoses regularity proximal bilateral middle cerebral artery is. No large vessel occlusion, hemodynamically significant stenosis, dissection, contrast extravasation or aneurysm. POSTERIOR CIRCULATION: Retrograde flow of distal RIGHT V4 segment. Mild luminal irregularity basilar artery and posterior cerebral arteries. LEFT V4 segment is widely patent. Normal appearance of the basilar artery main branch vessels. Predominately fetal origin of RIGHT posterior cerebral artery. No hemodynamically significant stenosis, dissection, contrast extravasation or  aneurysm. VENOUS SINUSES: Major dural venous sinuses are patent though not  tailored for evaluation on this angiographic examination. ANATOMIC VARIANTS: None. DELAYED PHASE: No abnormal intracranial enhancement. MIP images reviewed. IMPRESSION: CTA NECK: Occluded RIGHT vertebral artery without reconstitution in the neck, likely acute. Otherwise negative CTA NECK. CTA HEAD: No emergent large vessel occlusion or severe stenosis. Retrograde flow to distal RIGHT V4 segment. Intracranial atherosclerosis resulting in mild-to-moderate stenoses of the bilateral proximal middle cerebral arteries. Critical Value/emergent results were called by telephone at the time of interpretation on 11/02/2016 at 5:11 pm to Dr. Nanda Quinton , who verbally acknowledged these results. Electronically Signed   By: Elon Alas M.D.   On: 11/02/2016 17:14   Mr Brain Wo Contrast  Result Date: 11/03/2016 CLINICAL DATA:  Acute onset right-sided numbness. Symptoms began at 2:30 p.m. yesterday. EXAM: MRI HEAD WITHOUT CONTRAST MRA HEAD WITHOUT CONTRAST TECHNIQUE: Multiplanar, multiecho pulse sequences of the brain and surrounding structures were obtained without intravenous contrast. Angiographic images of the head were obtained using MRA technique without contrast. COMPARISON:  CT head without contrast 11/02/2016. CTA head and neck 11/02/2016. FINDINGS: MRI HEAD FINDINGS Brain: The diffusion-weighted images demonstrate an acute/ subacute nonhemorrhagic infarct involving the posterior limb of the left internal capsule. T2 signal changes are associated. Other scattered subcortical T2 hyperintensities are present bilaterally. No focal hemorrhage or mass lesion is present. The ventricles are of normal size. No significant extra-axial fluid collection is present. Internal auditory canals are within normal limits bilaterally. The brainstem and cerebellum are normal. Vascular: Abnormal signal is present in the right vertebral artery, compatible with  occluded flow. Skull and upper cervical spine: The skullbase is normal. The craniocervical junction is normal. The upper cervical spine is unremarkable. Midline sagittal structures are within normal limits. Sinuses/Orbits: Mild mucosal thickening is present in the maxillary sinuses and scattered throughout the ethmoid air cells. No fluid levels are present. Mastoid air cells are clear. The globes and orbits are unremarkable. MRA HEAD FINDINGS The internal carotid artery is are within normal limits from the high cervical segments through the ICA termini bilaterally. There is some attenuation of distal A1 segments and more prominently the distal left M1 segment. The MCA bifurcations are intact. Segmental narrowing is noted in the MCA branch vessels bilaterally, left greater than right. There is some attenuation of distal ACA branch vessels as well. The right vertebral artery is occluded with minimal flow in the distal segment. The left vertebral artery is intact. Basilar artery is patent. The right posterior cerebral artery is of fetal type. There is attenuation of distal PCA branch vessels bilaterally. IMPRESSION: 1. 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 2. Scattered subcortical T2 hyperintensities bilaterally are mildly advanced for age. These are nonspecific. Given the other ischemic disease, this likely reflects remote ischemia as well. 3. 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 4. Asymmetric atherosclerotic disease involving distal left M1 segment and left greater than right MCA branch vessels. These results were called by telephone at the time of interpretation on 11/03/2016 at 12:14 pm to Dr. Georgena Spurling, who verbally acknowledged these results. Electronically Signed   By: San Morelle M.D.   On: 11/03/2016 12:28   Mr [name] Head/brain BZ Cm  Result Date: 11/03/2016 CLINICAL DATA:  Acute onset right-sided  numbness. Symptoms began at 2:30 p.m. yesterday. EXAM: MRI HEAD WITHOUT CONTRAST MRA HEAD WITHOUT CONTRAST TECHNIQUE: Multiplanar, multiecho pulse sequences of the brain and surrounding structures were obtained without intravenous contrast. Angiographic images of the head were  obtained using MRA technique without contrast. COMPARISON:  CT head without contrast 11/02/2016. CTA head and neck 11/02/2016. FINDINGS: MRI HEAD FINDINGS Brain: The diffusion-weighted images demonstrate an acute/ subacute nonhemorrhagic infarct involving the posterior limb of the left internal capsule. T2 signal changes are associated. Other scattered subcortical T2 hyperintensities are present bilaterally. No focal hemorrhage or mass lesion is present. The ventricles are of normal size. No significant extra-axial fluid collection is present. Internal auditory canals are within normal limits bilaterally. The brainstem and cerebellum are normal. Vascular: Abnormal signal is present in the right vertebral artery, compatible with occluded flow. Skull and upper cervical spine: The skullbase is normal. The craniocervical junction is normal. The upper cervical spine is unremarkable. Midline sagittal structures are within normal limits. Sinuses/Orbits: Mild mucosal thickening is present in the maxillary sinuses and scattered throughout the ethmoid air cells. No fluid levels are present. Mastoid air cells are clear. The globes and orbits are unremarkable. MRA HEAD FINDINGS The internal carotid artery is are within normal limits from the high cervical segments through the ICA termini bilaterally. There is some attenuation of distal A1 segments and more prominently the distal left M1 segment. The MCA bifurcations are intact. Segmental narrowing is noted in the MCA branch vessels bilaterally, left greater than right. There is some attenuation of distal ACA branch vessels as well. The right vertebral artery is occluded with minimal flow in the distal  segment. The left vertebral artery is intact. Basilar artery is patent. The right posterior cerebral artery is of fetal type. There is attenuation of distal PCA branch vessels bilaterally. IMPRESSION: 1. 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 2. Scattered subcortical T2 hyperintensities bilaterally are mildly advanced for age. These are nonspecific. Given the other ischemic disease, this likely reflects remote ischemia as well. 3. 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 4. Asymmetric atherosclerotic disease involving distal left M1 segment and left greater than right MCA branch vessels. These results were called by telephone at the time of interpretation on 11/03/2016 at 12:14 pm to Dr. Georgena Spurling, who verbally acknowledged these results. Electronically Signed   By: San Morelle M.D.   On: 11/03/2016 12:28   Ct Head Code Stroke Wo Contrast`  Result Date: 11/02/2016 CLINICAL DATA:  Code stroke. Sudden onset of right-sided weakness while eating lunch today. EXAM: CT HEAD WITHOUT CONTRAST TECHNIQUE: Contiguous axial images were obtained from the base of the skull through the vertex without intravenous contrast. COMPARISON:  None. FINDINGS: Brain: No acute infarct, hemorrhage, or mass lesion is present. The ventricles are of normal size. No significant extraaxial fluid collection is present. Vascular: No hyperdense vessel or unexpected calcification. Skull: Normal. Negative for fracture or focal lesion. Sinuses/Orbits: The paranasal sinuses and mastoid air cells are clear. The globes and orbits are within normal limits. ASPECTS [facility] Stroke Program Early CT Score) - Ganglionic level infarction (caudate, lentiform nuclei, internal capsule, insula, M1-M3 cortex): 7/7 - Supraganglionic infarction (M4-M6 cortex): 3/3 Total score (0-10 with 10 being normal): 10/10 IMPRESSION: 1. Negative CT of the head 2. ASPECTS  is 10/10 These results were called by telephone at the time of interpretation on 11/02/2016 at 3:20 pm to Dr. Nanda Quinton , who verbally acknowledged these results. Electronically Signed   By: San Morelle M.D.   On: 11/02/2016 15:21        Scheduled Meds: . aspirin  300 mg Rectal Daily   Or  . aspirin  325 mg Oral Daily  . atorvastatin  40 mg Oral q1800  . enoxaparin (LOVENOX) injection  40 mg Subcutaneous Q24H   Continuous Infusions:   LOS: 1 day     Cordelia Poche, MD [facility] 11/04/2016, 11:27 AM Pager: [phone]  If 7PM-7AM, please contact night-coverage [email or web address] Password [facility] 11/04/2016, 11:27 AM

## 2016-11-04 NOTE — Progress Notes (Signed)
Occupational Therapy Treatment Patient Details Name: Philip Howard MRN: [id number] DOB: [birth year] Today's Date: 11/04/2016    History of present illness Pt is a 51 y.o. male with no significant past medical history. He presented with right-sided heaviness and numbness. UDS positive for cocaine and THC. 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   OT comments  Pt progressing well toward OT goals. Facilitated improved use of R UE with functional reaching in PNF patterns during self-care tasks and self-feeding tasks. Educated pt on use of built-up handles for self-feeding tasks and pt able to complete with 50% spillage this session. Continue to feel that pt is an excellent candidate for CIR level therapies to maximize return to independent PLOF. OT will continue to follow acutely.    Follow Up Recommendations  CIR;Supervision/Assistance - 24 hour    Equipment Recommendations  Other (comment) (defer to next venue of care)    Recommendations for Other Services Rehab consult    Precautions / Restrictions Precautions Precautions: Fall Restrictions Weight Bearing Restrictions: No       Mobility Bed Mobility Overal bed mobility: Needs Assistance Bed Mobility: Supine to Sit;Sit to Supine     Supine to sit: Supervision Sit to supine: Supervision   General bed mobility comments: Supervision for safety.   Transfers                      Balance Overall balance assessment: Needs assistance Sitting-balance support: No upper extremity supported;Feet supported Sitting balance-Leahy Scale: Fair                                     ADL either performed or assessed with clinical judgement   ADL Overall ADL's : Needs assistance/impaired Eating/Feeding: Minimal assistance;Sitting Eating/Feeding Details (indicate  cue type and reason): Provided built-up handles for utensils.                                    General ADL Comments: Pt educated on use of tubing for built-up handles on utensils to improve independence with self-feeding. Improved independence when utilizing visual biofeedback. Able to bring hand to mouth with built-up utensil three times this session. 25% spillage noted with simulated self-feeding tasks.      Vision       Perception     Praxis      Cognition Arousal/Alertness: Awake/alert Behavior During Therapy: WFL for tasks assessed/performed;Impulsive (Impulsive with frustrated) Overall Cognitive Status: Impaired/Different from baseline Area of Impairment: Safety/judgement                         Safety/Judgement: Decreased awareness of safety     General Comments: Pt impulsive with movement and this increases as he becomes frustrated. Educated pt on relaxation techniques to decrease frustration and related impulsivity which impacts his safety.         Exercises Exercises: Other exercises Other Exercises Other Exercises: Provided HEP for improved functional use of R UE to be completed 3 times/day for 10 repetitions each: lap slides with thumb facing up,  Other Exercises: Facilitated improved functional use of R UE with PNF patterns to reach for self-care items. Noted R shoulder hiking and facilitated decreased upper  trap hiking to promote improved scapular mobility with functional reaching.    Shoulder Instructions       General Comments      Pertinent Vitals/ Pain       Pain Assessment: Faces Faces Pain Scale: Hurts even more Pain Location: R side cramping intermittently Pain Descriptors / Indicators: Cramping Pain Intervention(s): Limited activity within patient's tolerance;Monitored during session;Repositioned (RN notified)  Home Living                                          Prior Functioning/Environment               Frequency  Min 3X/week        Progress Toward Goals  OT Goals(current goals can now be found in the care plan section)  Progress towards OT goals: Progressing toward goals  Acute Rehab OT Goals Patient Stated Goal: to go home OT Goal Formulation: With patient/family Time For Goal Achievement: 11/17/16 Potential to Achieve Goals: Good ADL Goals Pt Will Perform Eating: with adaptive utensils;sitting;with modified independence Pt Will Perform Grooming: with min guard assist;standing Pt Will Perform Upper Body Bathing: with adaptive equipment;sitting;with min guard assist Pt Will Perform Lower Body Bathing: with min guard assist;with adaptive equipment;sitting/lateral leans Pt Will Transfer to Toilet: with modified independence;ambulating;regular height toilet Pt Will Perform Toileting - Clothing Manipulation and hygiene: sit to/from stand;with modified independence  Plan Discharge plan remains appropriate    Co-evaluation                 AM-PAC PT "6 Clicks" Daily Activity     Outcome Measure   Help from another person eating meals?: A Little Help from another person taking care of personal grooming?: A Little Help from another person toileting, which includes using toliet, bedpan, or urinal?: A Lot Help from another person bathing (including washing, rinsing, drying)?: A Lot Help from another person to put on and taking off regular upper body clothing?: A Lot Help from another person to put on and taking off regular lower body clothing?: A Lot 6 Click Score: 14    End of Session Equipment Utilized During Treatment: Gait belt  OT Visit Diagnosis: Unsteadiness on feet (R26.81);Other abnormalities of gait and mobility (R26.89);Ataxia, unspecified (R27.0);Feeding difficulties (R63.3);Hemiplegia and hemiparesis Hemiplegia - Right/Left: Right Hemiplegia - dominant/non-dominant: Dominant Hemiplegia - caused by: Cerebral infarction   Activity Tolerance Patient  tolerated treatment well   Patient Left in chair;with call bell/phone within reach;with chair alarm set;with family/visitor present   Nurse Communication Mobility status (Pt with cramping)        Time: 0762-2633 OT Time Calculation (min): 30 min  Charges: OT General Charges $OT Visit: 1 Procedure OT Treatments $Self Care/Home Management : 23-37 mins  Norman Herrlich, MS OTR/L  Pager: [facility] 11/04/2016, 5:16 PM

## 2016-11-05 ENCOUNTER — Inpatient Hospital Stay (HOSPITAL_COMMUNITY)
Admission: RE | Admit: 2016-11-05 | Discharge: 2016-11-12 | DRG: 092 | Disposition: A | Discharge status: Home / Self Care | Payer: Self-pay | Source: Intra-hospital | Attending: Physical Medicine & Rehabilitation | Admitting: Physical Medicine & Rehabilitation

## 2016-11-05 ENCOUNTER — Encounter (HOSPITAL_COMMUNITY): Payer: Self-pay | Admitting: *Deleted

## 2016-11-05 DIAGNOSIS — I10 Essential (primary) hypertension: Secondary | ICD-10-CM | POA: Diagnosis present

## 2016-11-05 DIAGNOSIS — I6330 Cerebral infarction due to thrombosis of unspecified cerebral artery: Secondary | ICD-10-CM

## 2016-11-05 DIAGNOSIS — I5032 Chronic diastolic (congestive) heart failure: Secondary | ICD-10-CM

## 2016-11-05 DIAGNOSIS — F1010 Alcohol abuse, uncomplicated: Secondary | ICD-10-CM

## 2016-11-05 DIAGNOSIS — Z7151 Drug abuse counseling and surveillance of drug abuser: Secondary | ICD-10-CM

## 2016-11-05 DIAGNOSIS — R2689 Other abnormalities of gait and mobility: Principal | ICD-10-CM | POA: Diagnosis present

## 2016-11-05 DIAGNOSIS — F1410 Cocaine abuse, uncomplicated: Secondary | ICD-10-CM | POA: Diagnosis present

## 2016-11-05 DIAGNOSIS — E785 Hyperlipidemia, unspecified: Secondary | ICD-10-CM | POA: Diagnosis present

## 2016-11-05 DIAGNOSIS — I69351 Hemiplegia and hemiparesis following cerebral infarction affecting right dominant side: Secondary | ICD-10-CM

## 2016-11-05 DIAGNOSIS — M62838 Other muscle spasm: Secondary | ICD-10-CM | POA: Diagnosis present

## 2016-11-05 DIAGNOSIS — E876 Hypokalemia: Secondary | ICD-10-CM | POA: Diagnosis present

## 2016-11-05 DIAGNOSIS — R29898 Other symptoms and signs involving the musculoskeletal system: Secondary | ICD-10-CM

## 2016-11-05 DIAGNOSIS — I6300 Cerebral infarction due to thrombosis of unspecified precerebral artery: Secondary | ICD-10-CM

## 2016-11-05 DIAGNOSIS — F17210 Nicotine dependence, cigarettes, uncomplicated: Secondary | ICD-10-CM | POA: Diagnosis present

## 2016-11-05 LAB — BASIC METABOLIC PANEL
Anion gap: 11 (ref 5–15)
BUN: 15 mg/dL (ref 6–20)
CHLORIDE: 104 mmol/L (ref 101–111)
CO2: 20 mmol/L — ABNORMAL LOW (ref 22–32)
CREATININE: 1.15 mg/dL (ref 0.61–1.24)
Calcium: 10 mg/dL (ref 8.9–10.3)
GFR calc non Af Amer: >60 mL/min (ref >60)
Glucose, Bld: 118 mg/dL — ABNORMAL HIGH (ref 65–99)
Potassium: 3.8 mmol/L (ref 3.5–5.1)
SODIUM: 135 mmol/L (ref 135–145)

## 2016-11-05 LAB — CBC
HCT: 47.2 % (ref 39.0–52.0)
Hemoglobin: 15.6 g/dL (ref 13.0–17.0)
MCH: 30.6 pg (ref 26.0–34.0)
MCHC: 33.1 g/dL (ref 30.0–36.0)
MCV: 92.5 fL (ref 78.0–100.0)
PLATELETS: 291 10*3/uL (ref 150–400)
RBC: 5.1 MIL/uL (ref 4.22–5.81)
RDW: 14.8 % (ref 11.5–15.5)
WBC: 9.7 10*3/uL (ref 4.0–10.5)

## 2016-11-05 LAB — CREATININE, SERUM
CREATININE: 1.21 mg/dL (ref 0.61–1.24)
GFR calc non Af Amer: >60 mL/min (ref >60)

## 2016-11-05 LAB — MAGNESIUM: MAGNESIUM: 2.2 mg/dL (ref 1.7–2.4)

## 2016-11-05 MED ORDER — ATORVASTATIN CALCIUM 40 MG PO TABS: 40.0000 mg | ORAL_TABLET | Freq: Every day | ORAL | Status: DC

## 2016-11-05 MED ORDER — ACETAMINOPHEN 650 MG RE SUPP: 650.0000 mg | RECTAL | Status: DC | PRN

## 2016-11-05 MED ORDER — ATORVASTATIN CALCIUM 40 MG PO TABS
40.0000 mg | ORAL_TABLET | Freq: Every day | ORAL | Status: DC
  Administered 2016-11-05 – 2016-11-11 (×7): 40 mg via ORAL
  Filled 2016-11-05 (×7): qty 1

## 2016-11-05 MED ORDER — ENOXAPARIN SODIUM 40 MG/0.4ML ~~LOC~~ SOLN
40.0000 mg | Freq: Every day | SUBCUTANEOUS | Status: DC
  Administered 2016-11-05 – 2016-11-11 (×7): 40 mg via SUBCUTANEOUS
  Filled 2016-11-05 (×7): qty 0.4

## 2016-11-05 MED ORDER — MUSCLE RUB 10-15 % EX CREA
TOPICAL_CREAM | CUTANEOUS | Status: DC | PRN
  Filled 2016-11-05: qty 85

## 2016-11-05 MED ORDER — SENNOSIDES-DOCUSATE SODIUM 8.6-50 MG PO TABS: 1.0000 | ORAL_TABLET | Freq: Every evening | ORAL | Status: DC | PRN

## 2016-11-05 MED ORDER — ASPIRIN 300 MG RE SUPP: 300.0000 mg | Freq: Every day | RECTAL | Status: DC

## 2016-11-05 MED ORDER — ENOXAPARIN SODIUM 40 MG/0.4ML ~~LOC~~ SOLN: 40.0000 mg | SUBCUTANEOUS | Status: DC

## 2016-11-05 MED ORDER — ONDANSETRON HCL 4 MG/2ML IJ SOLN: 4.0000 mg | Freq: Four times a day (QID) | INTRAMUSCULAR | Status: DC | PRN

## 2016-11-05 MED ORDER — ASPIRIN 325 MG PO TABS: 325.0000 mg | ORAL_TABLET | Freq: Every day | ORAL | Status: DC

## 2016-11-05 MED ORDER — ACETAMINOPHEN 325 MG PO TABS
650.0000 mg | ORAL_TABLET | ORAL | Status: DC | PRN
  Administered 2016-11-06: 650 mg via ORAL
  Filled 2016-11-05: qty 2

## 2016-11-05 MED ORDER — KETOROLAC TROMETHAMINE 15 MG/ML IJ SOLN
15.0000 mg | Freq: Once | INTRAMUSCULAR | Status: AC
  Administered 2016-11-05: 15 mg via INTRAVENOUS
  Filled 2016-11-05: qty 1

## 2016-11-05 MED ORDER — METHOCARBAMOL 500 MG PO TABS
500.0000 mg | ORAL_TABLET | Freq: Three times a day (TID) | ORAL | Status: DC | PRN
  Administered 2016-11-05 – 2016-11-06 (×2): 500 mg via ORAL
  Filled 2016-11-05 (×2): qty 1

## 2016-11-05 MED ORDER — ONDANSETRON HCL 4 MG PO TABS: 4.0000 mg | ORAL_TABLET | Freq: Four times a day (QID) | ORAL | Status: DC | PRN

## 2016-11-05 MED ORDER — METHOCARBAMOL 500 MG PO TABS: 500.0000 mg | ORAL_TABLET | Freq: Three times a day (TID) | ORAL | Status: DC | PRN

## 2016-11-05 MED ORDER — ACETAMINOPHEN 160 MG/5ML PO SOLN: 650.0000 mg | ORAL | Status: DC | PRN

## 2016-11-05 MED ORDER — SORBITOL 70 % SOLN
30.0000 mL | Freq: Every day | Status: DC | PRN
  Administered 2016-11-05: 30 mL via ORAL
  Filled 2016-11-05: qty 30

## 2016-11-05 MED ORDER — ASPIRIN 325 MG PO TABS
325.0000 mg | ORAL_TABLET | Freq: Every day | ORAL | Status: DC
  Administered 2016-11-06 – 2016-11-12 (×7): 325 mg via ORAL
  Filled 2016-11-05 (×7): qty 1

## 2016-11-05 NOTE — PMR Pre-admission (Signed)
PMR Admission Coordinator Pre-Admission Assessment  Patient: Philip Howard is an 51 y.o., male MRN: [id number] DOB: [birth year] Height: 5\' 5"  (165.1 cm) Weight:                Insurance Information HMO:    PPO:      PCP:      IPA:      80/20:      OTHER:  PRIMARY: Pt. Is self pay, uninsured; I have notified financial counselor Sherlyn Lees of need to visit pt.      Policy#:       Subscriber:  CM Name:       Phone#:      Fax#:  Pre-Cert#:       Employer:  Benefits:  Phone #:      Name:  Eff. Date:      Deduct:       Out of Pocket Max:       Life Max:  CIR:       SNF:  Outpatient:      Co-Pay:  Home Health:       Co-Pay:  DME:      Co-Pay:  Providers:  SECONDARY:       Policy#:       Subscriber:  CM Name:       Phone#:      Fax#:  Pre-Cert#:       Employer:  Benefits:  Phone #:      Name:  Eff. Date:      Deduct:       Out of Pocket Max:       Life Max:  CIR:       SNF:  Outpatient:      Co-Pay:  Home Health:       Co-Pay:  DME:      Co-Pay:   Medicaid Application Date:       Case Manager:  Disability Application Date:       Case Worker:   Emergency Contact Information Contact Information    Name Relation Home Work Mobile   Philip Howard Son [phone]       Current Medical History  Patient Admitting Diagnosis: Left internal capsule infarct. History of Present Illness: Philip [place] 51 y.o.right handed malewith history of tobacco, [name] Per chart review and patient, patient lives with girlfriend independent prior to admission. Presented 11/02/2016 with right-sided weakness. Urine drug screen positive for cocaine and marijuana. CT reviewed, showing left basal ganglia infarct. Per report, MRI showed acute subacute nonhemorrhagic infarct involving the posterior limb of the internal capsule. 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 CTA of the head showed no  emergent large vessel occlusion or severe stenosis. Echocardiogram with ejection fraction of 10% grade 1 diastolic dysfunction. Patient did not receive TPA. Repeat cranial CT scan 11/04/2016 secondary to complaints of increased numbness to the right side showed no evidence of progression no hemorrhagic conversion. Neurology consulted presently on aspirin for CVA prophylaxis. Subcutaneous Lovenox for DVT prophylaxis. Physical and occupational therapy evaluations completed with recommendations of physical medicine rehabilitation consult.Patient was admitted for a comprehensive rehab program  Total: 2 [facility]    Past Medical History  History reviewed. No pertinent past medical history.  Family History  family history is not on file.  Prior Rehab/Hospitalizations:  Has the patient had major surgery during 100 days prior to admission? No  Current Medications   Current Facility-Administered  Medications:  .  acetaminophen (TYLENOL) tablet 650 mg, 650 mg, Oral, Q4H PRN, 650 mg at 11/05/16 0925 **OR** acetaminophen (TYLENOL) solution 650 mg, 650 mg, Per Tube, Q4H PRN **OR** acetaminophen (TYLENOL) suppository 650 mg, 650 mg, Rectal, Q4H PRN, Opyd, Timothy S, MD .  aspirin suppository 300 mg, 300 mg, Rectal, Daily **OR** aspirin tablet 325 mg, 325 mg, Oral, Daily, Opyd, Ilene Qua, MD, 325 mg at 11/05/16 0925 .  atorvastatin (LIPITOR) tablet 40 mg, 40 mg, Oral, q1800, Rosalin Hawking, MD, 40 mg at 11/04/16 1730 .  enoxaparin (LOVENOX) injection 40 mg, 40 mg, Subcutaneous, Q24H, Opyd, Ilene Qua, MD, 40 mg at 11/04/16 1730 .  methocarbamol (ROBAXIN) tablet 500 mg, 500 mg, Oral, Q8H PRN, Mariel Aloe, MD, 500 mg at 11/05/16 0925 .  MUSCLE RUB CREA, , Topical, PRN, Mariel Aloe, MD .  senna-docusate (Senokot-S) tablet 1 tablet, 1 tablet, Oral, QHS PRN, Opyd, Ilene Qua, MD  Patients Current Diet: Diet Heart Room service appropriate? Yes; Fluid consistency: Thin  Precautions /  Restrictions Precautions Precautions: Fall Restrictions Weight Bearing Restrictions: No   Has the patient had 2 or more falls or a fall with injury in the past year?No  Prior Activity Level Community (5-7x/wk): Pt. goes out of the home daily.  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    Home Assistive Devices / Equipment Home Assistive Devices/Equipment: None Home Equipment: Shower seat - built in  Prior Device Use: Indicate devices/aids used by the patient prior to current illness, exacerbation or injury? None of the above  Prior Functional Level Prior Function Level of Independence: Independent Comments: working full time, 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  Self Care: Did the patient need help bathing, dressing, using the toilet or eating?  Independent  Indoor Mobility: Did the patient need assistance with walking from room to room (with or without device)? Independent  Stairs: Did the patient need assistance with internal or external stairs (with or without device)? Independent  Functional Cognition: Did the patient need help planning regular tasks such as shopping or remembering to take medications? Independent  Current Functional Level Cognition  Arousal/Alertness: Awake/alert Overall Cognitive Status: Impaired/Different from baseline Orientation Level: Oriented X4 Safety/Judgement: Decreased awareness of safety General Comments: patient with improvements in receptivity today, eager to participate and progress mobility. Attention: Sustained Sustained Attention: Appears intact Memory:  (mild deficits short-term recall) Safety/Judgment: Appears intact    Extremity Assessment (includes Sensation/Coordination)  Upper Extremity Assessment: RUE deficits/detail RUE Deficits / Details: grossly 3+/5; unable to write name RUE Sensation: decreased light touch, decreased proprioception RUE Coordination: decreased fine motor, decreased gross motor  Lower Extremity  Assessment: Defer to PT evaluation RLE Deficits / Details: gross muscle weakness RLE 4-/5 RLE Sensation: decreased light touch, decreased proprioception RLE Coordination: decreased fine motor, decreased gross motor    ADLs  Overall ADL's : Needs assistance/impaired Eating/Feeding: Minimal assistance, Sitting Eating/Feeding Details (indicate cue type and reason): Provided built-up handles for utensils.  Grooming: Wash/dry face, Sitting, Wash/dry hands, Moderate assistance (trouble keeping washcloth in R hand) Upper Body Bathing: Sitting, Minimal assistance Lower Body Bathing: Sitting/lateral leans, Moderate assistance Upper Body Dressing : Sitting, Moderate assistance Lower Body Dressing: Moderate assistance, Sit to/from stand Lower Body Dressing Details (indicate cue type and reason): able to don/doff socks, but assist needed to pull pants up while standing Toilet Transfer: Moderate assistance, Comfort height toilet, Grab bars Toilet Transfer Details (indicate cue type and reason): heavy reliance with 1 HHA with therapist and  verbal cues for right leg activation Toileting- Clothing Manipulation and Hygiene: Moderate assistance Tub/ Shower Transfer: Moderate assistance Functional mobility during ADLs: Moderate assistance, Cueing for sequencing, Cueing for safety (1 HHA) General ADL Comments: Pt educated on use of tubing for built-up handles on utensils to improve independence with self-feeding. Improved independence when utilizing visual biofeedback. Able to bring hand to mouth with built-up utensil three times this session. 25% spillage noted with simulated self-feeding tasks.     Mobility  Overal bed mobility: Needs Assistance Bed Mobility: Supine to Sit, Sit to Supine Supine to sit: Supervision Sit to supine: Supervision General bed mobility comments: Supervision for safety.     Transfers  Overall transfer level: Needs assistance Equipment used: None Transfers: Sit to/from  Stand Sit to Stand: Min assist General transfer comment: continues to required min assist to for elevation to upright, performed transfer training x5 during session from low surface chair with no arms, educated on technique to power up without UE push off    Ambulation / Gait / Stairs / Wheelchair Mobility  Ambulation/Gait Ambulation/Gait assistance: Mod assist Ambulation Distance (Feet): 40 Feet (x10) Assistive device: 1 person hand held assist (wrap around support with gait belt) Gait Pattern/deviations: Step-to pattern, Decreased stride length, Decreased dorsiflexion - right, Ataxic, Trunk flexed General Gait Details: patient showing improvements in carry over of technique with gait retraining. Able to perform short distance with min guard to min assist for stability. Patient utilizing strategies for quad setting and weight shifting with improved consistency allowing for longer periods of steady gait performance.  Gait velocity: decreased Gait velocity interpretation: Below normal speed for age/gender    Posture / Balance Dynamic Sitting Balance Sitting balance - Comments: significant LOB and inability to reposition ith dynamic sitting, but able to maintain static sitting balance. Noted poor postural alignment  Balance Overall balance assessment: Needs assistance Sitting-balance support: No upper extremity supported, Feet supported Sitting balance-Leahy Scale: Fair Sitting balance - Comments: significant LOB and inability to reposition ith dynamic sitting, but able to maintain static sitting balance. Noted poor postural alignment  Standing balance-Leahy Scale: Fair Standing balance comment: able to static stand for breing periods without UE support, can not accept challenges at this time    Special needs/care consideration BiPAP/CPAP   no CPM   no Continuous Drip IV   no Dialysis    no        Life Vest   no Oxygen   no Special Bed    no Trach Size  n/a Wound Vac (area)  no        Skin   WDL                              Bowel mgmt: last BM 11/03/16  ,incontinent per pt.  Bladder mgmt: continent, assist to bathroom Diabetic mgmt n/a     Previous Home Environment Living Arrangements: Spouse/significant other, Children (son with impairment who he is caregiver for)  Lives With: Family Available Help at Discharge: Family, Available 24 hours/day Type of Home: Mobile home (double wide) Home Layout: One level Home Access: Ramped entrance Entrance Stairs-Rails: Right, Left Bathroom Shower/Tub: Gaffer, Chiropodist: Standard Bathroom Accessibility: Yes How Accessible: Accessible via walker Home Care Services: No Additional Comments: son requires physical assist and he is the one usually providing it  Discharge Living Setting Plans for Discharge Living Setting: Patient's home Type of Home at Discharge: Mobile home  Discharge Home Layout: One level Discharge Home Access: Ramped entrance Discharge Bathroom Shower/Tub: Tub/shower unit, Walk-in shower Discharge Bathroom Toilet: Standard Discharge Bathroom Accessibility: Yes How Accessible: Accessible via walker Does the patient have any problems obtaining your medications?: No  Social/Family/Support Systems Patient Roles: Partner, Parent Anticipated Caregiver: Pt's girlfriend lives with patient and works full time.  Pt's sister will care for pt. while girlfriend is at work.  Sister indicates pt. has 6 sisters and there will be plenty of caregivers. Anticipated Caregiver's Contact Information: Theresia Lo, sister, [phone]; Ardelle Balls, girlfriend, [phone] Ability/Limitations of Caregiver: girlfriend works full time; no limitations with sister Caregiver Availability: 24/7 Discharge Plan Discussed with Primary Caregiver: Yes Is Caregiver In Agreement with Plan?: Yes Does Caregiver/Family have Issues with Lodging/Transportation while Pt is in Rehab?: No  I informed pt. That he  is currently self pay but that I would reach out to financial counselor.  He desires to move forward with IP Rehab admission.     Goals/Additional Needs Patient/Family Goal for Rehab: supervision and minimal assistance PT/OT, n/a SLP Expected length of stay: 14-19 days Cultural Considerations: "[facility]" Dietary Needs: heart diet, thin liquids Equipment Needs: TBA Additional Information: Pt's son was hit by a car 8 years ago and is a brain injury patient who is ambulatory with assist.  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 Pt/Family Agrees to Admission and willing to participate: Yes Program Orientation Provided & Reviewed with Pt/Caregiver Including Roles  & Responsibilities: Yes   Decrease burden of Care through IP rehab admission:    Possible need for SNF placement upon discharge:   Not expected   Patient Condition: This patient's condition remains as documented in the consult dated 11/05/16 , in which the Rehabilitation Physician determined and documented that the patient's condition is appropriate for intensive rehabilitative care in an inpatient rehabilitation facility. Will admit to inpatient rehab today.  Preadmission Screen Completed By:  Gerlean Ren, 11/05/2016 11:58 AM ______________________________________________________________________   Discussed status with Dr.  Naaman Plummer on 11/05/16 at  1348  and received telephone approval for admission today.  Admission Coordinator:  Gerlean Ren, time 1660 Sudie Grumbling 11/05/16

## 2016-11-05 NOTE — Progress Notes (Signed)
Occupational Therapy Treatment Patient Details Name: Philip Howard MRN: [id number] DOB: [birth year] Today's Date: 11/05/2016    History of present illness Pt is a 51 y.o. male with no significant past medical history. He presented with right-sided heaviness and numbness. UDS positive for cocaine and THC. 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   OT comments  Pt progressing toward OT goals. This session he was able to complete AAROM for shoulder flexion with minimum assistance and HEP initiated previous session with moderate instructional cues for proper technique and positioning (see below for further details). Pt able to stand at sink to complete functional reaching tasks with R UE during ADL and benefited from both mirror biofeedback and tactile feedback to prevent upper trap hiking when reaching for targets. He continues to demonstrate improved accuracy with functional reach when incorporating visual feedback as well. Pt would benefit from continued OT services while admitted. Continue to feel that pt is an excellent CIR candidate in order to maximize independence with ADL. Pt highly motivated to participate with improved outlook this session.    Follow Up Recommendations  CIR;Supervision/Assistance - 24 hour    Equipment Recommendations  Other (comment) (TBD at next venue of care)    Recommendations for Other Services Rehab consult    Precautions / Restrictions Precautions Precautions: Fall Restrictions Weight Bearing Restrictions: No       Mobility Bed Mobility               General bed mobility comments: OOB in chair on OT arrival.   Transfers Overall transfer level: Needs assistance Equipment used: None Transfers: Sit to/from Stand Sit to Stand: Min assist         General transfer comment: Min assist to  steady and for full power up into standing.     Balance Overall balance assessment: Needs assistance Sitting-balance support: No upper extremity supported;Feet supported Sitting balance-Leahy Scale: Fair     Standing balance support: No upper extremity supported;During functional activity;Single extremity supported Standing balance-Leahy Scale: Fair Standing balance comment: Statically able to stand with min guard assist however demonstrates LOB requiring min and up to mod assist with dynamic standing tasks during ADL.                            ADL either performed or assessed with clinical judgement   ADL Overall ADL's : Needs assistance/impaired                     Lower Body Dressing: Moderate assistance;Sit to/from stand Lower Body Dressing Details (indicate cue type and reason): Able to adjust socks this session with instructional cues to incorporate B hands. Assist required for pulling up pants in standing.  Toilet Transfer: Minimal assistance;Moderate assistance;Ambulation;BSC Toilet Transfer Details (indicate cue type and reason): Fluctuating assist between min and mod assist throughout session as pt moving quickly and deos demonstrate some R knee buckling at times.          Functional mobility during ADLs: Minimal assistance;Moderate assistance General ADL Comments: Pt able to complete reaching tasks standing at sink and benefitted from tactile and visual feedback to prevent upper trap hiking. Educated pt on AAROM with R UE for forward flexion and pt able to complete with min assist and tactile cues for decreased upper trap hiking. Additionally educated pt and family  concerning incorporation of R hand into functional tasks to improve bimanual skills.       Vision   Vision Assessment?: No apparent visual deficits   Perception     Praxis      Cognition Arousal/Alertness: Awake/alert Behavior During Therapy: WFL for tasks  assessed/performed;Impulsive Overall Cognitive Status: Impaired/Different from baseline Area of Impairment: Safety/judgement                         Safety/Judgement: Decreased awareness of safety     General Comments: When frustrated, pt with impulsivity during session. Educated on safe transfer techniques and he demonstrates good understanding.         Exercises Other Exercises Other Exercises: Pt able to complete HEP with moderate verbal cueing for technique and approrpiate movement. HEP including R UE lap slides, supination/pronation, and composite grasp/release for 10 repetitions each. Other Exercises: Facilitated weight bearing through R UE providing hand over hand assist at times for optimal shoulder alignment during reaching tasks (with L UE) in standing position.   Shoulder Instructions       General Comments      Pertinent Vitals/ Pain       Pain Assessment: Faces Faces Pain Scale: Hurts even more Pain Location: R side cramping intermittently Pain Descriptors / Indicators: Cramping Pain Intervention(s): Limited activity within patient's tolerance;Monitored during session;Repositioned  Home Living                                          Prior Functioning/Environment              Frequency  Min 3X/week        Progress Toward Goals  OT Goals(current goals can now be found in the care plan section)  Progress towards OT goals: Progressing toward goals  Acute Rehab OT Goals Patient Stated Goal: to go home OT Goal Formulation: With patient/family Time For Goal Achievement: 11/17/16 Potential to Achieve Goals: Good ADL Goals Pt Will Perform Eating: with adaptive utensils;sitting;with modified independence Pt Will Perform Grooming: with min guard assist;standing Pt Will Perform Upper Body Bathing: with adaptive equipment;sitting;with min guard assist Pt Will Perform Lower Body Bathing: with min guard assist;with adaptive  equipment;sitting/lateral leans Pt Will Transfer to Toilet: with modified independence;ambulating;regular height toilet Pt Will Perform Toileting - Clothing Manipulation and hygiene: sit to/from stand;with modified independence  Plan Discharge plan remains appropriate    Co-evaluation                 AM-PAC PT "6 Clicks" Daily Activity     Outcome Measure   Help from another person eating meals?: A Little Help from another person taking care of personal grooming?: A Little Help from another person toileting, which includes using toliet, bedpan, or urinal?: A Lot Help from another person bathing (including washing, rinsing, drying)?: A Lot Help from another person to put on and taking off regular upper body clothing?: A Little Help from another person to put on and taking off regular lower body clothing?: A Lot 6 Click Score: 15    End of Session Equipment Utilized During Treatment: Gait belt  OT Visit Diagnosis: Unsteadiness on feet (R26.81);Other abnormalities of gait and mobility (R26.89);Ataxia, unspecified (R27.0);Feeding difficulties (R63.3);Hemiplegia and hemiparesis Hemiplegia - Right/Left: Right Hemiplegia - dominant/non-dominant: Dominant Hemiplegia - caused by: Cerebral infarction   Activity Tolerance Patient tolerated treatment well  Patient Left in chair;with call bell/phone within reach;with family/visitor present   Nurse Communication Mobility status        Time: 0721-8288 OT Time Calculation (min): 20 min  Charges: OT General Charges $OT Visit: 1 Procedure OT Treatments $Therapeutic Activity: 8-22 mins  Norman Herrlich, MS OTR/L  Pager: [place] 11/05/2016, 5:05 PM

## 2016-11-05 NOTE — Consult Note (Signed)
Physical Medicine and Rehabilitation Consult Reason for Consult: Right side weakness Referring Physician: Internal medicine   HPI: Philip Howard is a 51 y.o. right handed male with history of tobacco, [name] Per chart review and patient, patient lives with girlfriend independent prior to admission. Presented 11/02/2016 with right-sided weakness. Urine drug screen positive for cocaine and marijuana. CT reviewed, showing left basal ganglia infarct. Per report, MRI showed acute subacute nonhemorrhagic infarct involving the posterior limb of the internal capsule. 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 CTA of the head showed no emergent large vessel occlusion or severe stenosis. Echocardiogram with ejection fraction of 97% grade 1 diastolic dysfunction. Patient did not receive TPA. Repeat cranial CT scan 11/04/2016 secondary to complaints of increased numbness to the right side showed no evidence of progression no hemorrhagic conversion. Neurology consulted presently on aspirin for CVA prophylaxis. Subcutaneous Lovenox for DVT prophylaxis. Physical and occupational therapy evaluations completed with recommendations of physical medicine rehabilitation consult.   Review of Systems  Constitutional: Negative for chills and fever.  Eyes: Negative for blurred vision and double vision.  Respiratory: Negative for cough and shortness of breath.   Cardiovascular: Negative for chest pain and palpitations.  Gastrointestinal: Positive for constipation. Negative for nausea and vomiting.  Genitourinary: Negative for dysuria, flank pain and hematuria.  Musculoskeletal: Positive for myalgias.  Skin: Negative for rash.  Neurological: Positive for dizziness, sensory change, focal weakness and weakness. Negative for seizures.  All other systems reviewed and are negative.  History reviewed. No pertinent past  medical history. Past Surgical History:  Procedure Laterality Date  . arthroscopic surgery     History reviewed. No pertinent family history. Social History:  reports that he has been smoking Cigarettes.  He has been smoking about 0.50 packs per day. He has never used smokeless tobacco. He reports that he drinks alcohol. He reports that he does not use drugs. Allergies: No Known Allergies No prescriptions prior to admission.    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) Available Help at Discharge: Family, Available 24 hours/day Type of Home: Mobile home (double wide) Home Access: Ramped entrance Entrance Stairs-Rails: Right, Left Home Layout: One level Bathroom Shower/Tub: Gaffer, Chiropodist: Standard Bathroom Accessibility: Yes Home Equipment: Shower seat - built in Additional Comments: son requires physical assist and he is the one usually providing it  Lives With: Family  Functional History: Prior Function Level of Independence: Independent Comments: working full time, 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  Transfers Overall transfer level: Needs assistance Equipment used: None Transfers: Sit to/from Stand Sit to Stand: Min assist General transfer comment: Min A required to maintain balance upon standing  Ambulation/Gait Ambulation/Gait assistance: Mod assist Ambulation Distance (Feet): 20 Feet (x6) Assistive device: 1 person hand held assist (wrap around support with gait belt) Gait Pattern/deviations: Step-to pattern, Decreased stride length, Decreased dorsiflexion - right, Ataxic, Trunk  flexed General Gait Details: Pt presented with poor step cooridnation with RLE and decreased weight shifting to the LLE. PT provided manual facilitation at the pelvis to promote trunk extension and increased weight shift toward LLE during R swing  phase. Pt completed multiple trials of up to 68ft of gait training Verbal cues provided to contract R quad prior to RLE stance to improve control. Pt able to complete 8-10 steps of controlled gait before demonstrating decreased coordination and control and required mod A from PT to improve stability. PT provided cues to stop and rest throughout with increased gait deviations to prevent reinforcement of incorrect gait pattern. Pt demonstrated improvedment with multiple gait trials with ability to ambulate 27ft while maintaining steady gait. Gait velocity: decreased Gait velocity interpretation: Below normal speed for age/gender    ADL: ADL Overall ADL's : Needs assistance/impaired Eating/Feeding: Minimal assistance, Sitting Eating/Feeding Details (indicate cue type and reason): Provided built-up handles for utensils.  Grooming: Wash/dry face, Sitting, Wash/dry hands, Moderate assistance (trouble keeping washcloth in R hand) Upper Body Bathing: Sitting, Minimal assistance Lower Body Bathing: Sitting/lateral leans, Moderate assistance Upper Body Dressing : Sitting, Moderate assistance Lower Body Dressing: Moderate assistance, Sit to/from stand Lower Body Dressing Details (indicate cue type and reason): able to don/doff socks, but assist needed to pull pants up while standing Toilet Transfer: Moderate assistance, Comfort height toilet, Grab bars Toilet Transfer Details (indicate cue type and reason): heavy reliance with 1 HHA with therapist and verbal cues for right leg activation Toileting- Clothing Manipulation and Hygiene: Moderate assistance Tub/ Shower Transfer: Moderate assistance Functional mobility during ADLs: Moderate assistance, Cueing for  sequencing, Cueing for safety (1 HHA) General ADL Comments: Pt educated on use of tubing for built-up handles on utensils to improve independence with self-feeding. Improved independence when utilizing visual biofeedback. Able to bring hand to mouth with built-up utensil three times this session. 25% spillage noted with simulated self-feeding tasks.   Cognition: Cognition Overall Cognitive Status: Impaired/Different from baseline Arousal/Alertness: Awake/alert Orientation Level: Oriented X4 Attention: Sustained Sustained Attention: Appears intact Memory:  (mild deficits short-term recall) Safety/Judgment: Appears intact Cognition Arousal/Alertness: Awake/alert Behavior During Therapy: WFL for tasks assessed/performed, Impulsive (Impulsive with frustrated) Overall Cognitive Status: Impaired/Different from baseline Area of Impairment: Safety/judgement Safety/Judgement: Decreased awareness of safety General Comments: Pt impulsive with movement and this increases as he becomes frustrated. Educated pt on relaxation techniques to decrease frustration and related impulsivity which impacts his safety.   Blood pressure (!) 152/99, pulse 74, temperature 97.6 F (36.4 C), temperature source Oral, resp. rate 20, height 5\' 5"  (1.651 m), SpO2 99 %. Physical Exam  Vitals reviewed. Constitutional: He is oriented to person, place, and time. He appears well-developed and well-nourished.  HENT:  Head: Normocephalic and atraumatic.  Eyes: Conjunctivae and EOM are normal. Right eye exhibits no discharge. Left eye exhibits no discharge.  Neck: Normal range of motion. Neck supple. No thyromegaly present.  Cardiovascular: Normal rate, regular rhythm and normal heart sounds.   Respiratory: Effort normal and breath sounds normal. No respiratory distress.  GI: Soft. Bowel sounds are normal. He exhibits no distension.  Musculoskeletal: He exhibits no edema or tenderness.  Neurological: He is alert and oriented  to person, place, and time.  Follows full commands.  Fair awareness of deficits. Motor: LUE/LLE: 5/5 proximal to distal RUE: 4-/5 proximal to distal RLE: 4/5 proximal to distal Sensation diminished to light touch RUE/RLE DTRs 3+ RUE/RLE  Skin: Skin is warm and dry.  Psychiatric: He has a normal mood and affect. His behavior is normal.    No results found for this or any previous visit (from the past 24 hour(s)). Ct Head W Contrast  Result Date: 11/04/2016 CLINICAL DATA:  Right-sided weakness EXAM: CT HEAD  WITHOUT CONTRAST TECHNIQUE: Contiguous axial images were obtained from the base of the skull through the vertex without intravenous contrast. COMPARISON:  Two days ago. FINDINGS: Brain: Acute infarct in the left basal ganglia and corona radiata shows no evidence of progression or hemorrhage. No evidence of new infarct. No hydrocephalus or mass effect. Vascular: No hyperdense vessel. Skull: No acute or aggressive finding. Sinuses/Orbits: Negative IMPRESSION: Known acute infarct in the left basal ganglia and corona radiata. No evidence of progression. No hemorrhagic conversion. Electronically Signed   By: Monte Fantasia M.D.   On: 11/04/2016 08:20   Mr Brain Wo Contrast  Result Date: 11/03/2016 CLINICAL DATA:  Acute onset right-sided numbness. Symptoms began at 2:30 p.m. yesterday. EXAM: MRI HEAD WITHOUT CONTRAST MRA HEAD WITHOUT CONTRAST TECHNIQUE: Multiplanar, multiecho pulse sequences of the brain and surrounding structures were obtained without intravenous contrast. Angiographic images of the head were obtained using MRA technique without contrast. COMPARISON:  CT head without contrast 11/02/2016. CTA head and neck 11/02/2016. FINDINGS: MRI HEAD FINDINGS Brain: The diffusion-weighted images demonstrate an acute/ subacute nonhemorrhagic infarct involving the posterior limb of the left internal capsule. T2 signal changes are associated. Other scattered subcortical T2 hyperintensities are present  bilaterally. No focal hemorrhage or mass lesion is present. The ventricles are of normal size. No significant extra-axial fluid collection is present. Internal auditory canals are within normal limits bilaterally. The brainstem and cerebellum are normal. Vascular: Abnormal signal is present in the right vertebral artery, compatible with occluded flow. Skull and upper cervical spine: The skullbase is normal. The craniocervical junction is normal. The upper cervical spine is unremarkable. Midline sagittal structures are within normal limits. Sinuses/Orbits: Mild mucosal thickening is present in the maxillary sinuses and scattered throughout the ethmoid air cells. No fluid levels are present. Mastoid air cells are clear. The globes and orbits are unremarkable. MRA HEAD FINDINGS The internal carotid artery is are within normal limits from the high cervical segments through the ICA termini bilaterally. There is some attenuation of distal A1 segments and more prominently the distal left M1 segment. The MCA bifurcations are intact. Segmental narrowing is noted in the MCA branch vessels bilaterally, left greater than right. There is some attenuation of distal ACA branch vessels as well. The right vertebral artery is occluded with minimal flow in the distal segment. The left vertebral artery is intact. Basilar artery is patent. The right posterior cerebral artery is of fetal type. There is attenuation of distal PCA branch vessels bilaterally. IMPRESSION: 1. 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 2. Scattered subcortical T2 hyperintensities bilaterally are mildly advanced for age. These are nonspecific. Given the other ischemic disease, this likely reflects remote ischemia as well. 3. 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 4. Asymmetric atherosclerotic disease involving distal left M1 segment and left greater than right MCA  branch vessels. These results were called by telephone at the time of interpretation on 11/03/2016 at 12:14 pm to Dr. Georgena Spurling, who verbally acknowledged these results. Electronically Signed   By: San Morelle M.D.   On: 11/03/2016 12:28   Mr [name] Head/brain PY Cm  Result Date: 11/03/2016 CLINICAL DATA:  Acute onset right-sided numbness. Symptoms began at 2:30 p.m. yesterday. EXAM: MRI HEAD WITHOUT CONTRAST MRA HEAD WITHOUT CONTRAST TECHNIQUE: Multiplanar, multiecho pulse sequences of the brain and surrounding structures were obtained without intravenous contrast. Angiographic images of the head were obtained using MRA technique without contrast. COMPARISON:  CT  head without contrast 11/02/2016. CTA head and neck 11/02/2016. FINDINGS: MRI HEAD FINDINGS Brain: The diffusion-weighted images demonstrate an acute/ subacute nonhemorrhagic infarct involving the posterior limb of the left internal capsule. T2 signal changes are associated. Other scattered subcortical T2 hyperintensities are present bilaterally. No focal hemorrhage or mass lesion is present. The ventricles are of normal size. No significant extra-axial fluid collection is present. Internal auditory canals are within normal limits bilaterally. The brainstem and cerebellum are normal. Vascular: Abnormal signal is present in the right vertebral artery, compatible with occluded flow. Skull and upper cervical spine: The skullbase is normal. The craniocervical junction is normal. The upper cervical spine is unremarkable. Midline sagittal structures are within normal limits. Sinuses/Orbits: Mild mucosal thickening is present in the maxillary sinuses and scattered throughout the ethmoid air cells. No fluid levels are present. Mastoid air cells are clear. The globes and orbits are unremarkable. MRA HEAD FINDINGS The internal carotid artery is are within normal limits from the high cervical segments through the ICA termini bilaterally. There is some  attenuation of distal A1 segments and more prominently the distal left M1 segment. The MCA bifurcations are intact. Segmental narrowing is noted in the MCA branch vessels bilaterally, left greater than right. There is some attenuation of distal ACA branch vessels as well. The right vertebral artery is occluded with minimal flow in the distal segment. The left vertebral artery is intact. Basilar artery is patent. The right posterior cerebral artery is of fetal type. There is attenuation of distal PCA branch vessels bilaterally. IMPRESSION: 1. 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 2. Scattered subcortical T2 hyperintensities bilaterally are mildly advanced for age. These are nonspecific. Given the other ischemic disease, this likely reflects remote ischemia as well. 3. 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 4. Asymmetric atherosclerotic disease involving distal left M1 segment and left greater than right MCA branch vessels. These results were called by telephone at the time of interpretation on 11/03/2016 at 12:14 pm to Dr. Georgena Spurling, who verbally acknowledged these results. Electronically Signed   By: San Morelle M.D.   On: 11/03/2016 12:28    Assessment/Plan: Diagnosis: Left internal capsule infarct. Labs and images independently reviewed.  Records reviewed and summated above. Stroke: Continue secondary stroke prophylaxis and Risk Factor Modification listed below:   Antiplatelet therapy:   Blood Pressure Management:  Continue current medication with prn's with permisive HTN per primary team Statin Agent:   Prediabetes management:   Tobacco abuse:   Left sided hemiparesis: fit for orthosis to prevent contractures (resting hand splint for day, wrist cock up splint at night, etc) Motor recovery: Fluoxetine   1. Does the need for close, 24 hr/day medical supervision in concert with the  patient's rehab needs make it unreasonable for this patient to be served in a less intensive setting? Yes 2. Co-Morbidities requiring supervision/potential complications: tobacco abuse (counse), polysubstance abuse (counsel), diastolic CHF (monitor for signs/symptoms of fluid overload), hypokalemia (continue to monitor and replete as necessary), HLD (cont meds) 3. Due to safety, disease management and patient education, does the patient require 24 hr/day rehab nursing? Yes 4. Does the patient require coordinated care of a physician, rehab nurse, PT (1-25 hrs/day, 5 days/week) and OT (1-2 hrs/day, 5 days/week) to address physical and functional deficits in the context of the above medical diagnosis(es)? Yes Addressing deficits in the following areas: balance, endurance, locomotion, strength, transferring, bathing, dressing, toileting and psychosocial support 5.  Can the patient actively participate in an intensive therapy program of at least 3 hrs of therapy per day at least 5 days per week? Yes 6. The potential for patient to make measurable gains while on inpatient rehab is excellent 7. Anticipated functional outcomes upon discharge from inpatient rehab are supervision and min assist  with PT, supervision and min assist with OT, supervision and min assist with SLP. 8. Estimated rehab length of stay to reach the above functional goals is: 14-19 days. 9. Anticipated D/C setting: Home 10. Anticipated post D/C treatments: HH therapy and Home excercise program 11. Overall Rehab/Functional Prognosis: good  RECOMMENDATIONS: This patient's condition is appropriate for continued rehabilitative care in the following setting: CIR Patient has agreed to participate in recommended program. Yes Note that insurance prior authorization may be required for reimbursement for recommended care.  Comment: Rehab Admissions Coordinator to follow up.  Delice Lesch, MD, Mellody Drown Cathlyn Parsons., PA-C 11/05/2016

## 2016-11-05 NOTE — Progress Notes (Signed)
Inpatient Rehabilitation  I met with the patient, his girlfriend Hassan Rowan and his sister Pearlie at the bedside to discuss the recommendation for IP Rehab.  I provided informational booklets and answered their questions.  Pt. Indicates he wants to come to IP Rehab.  I should know around 2 pm about bed availability and will update the acute team as soon as I have more information.  Please call if questions.  [place] Admissions Coordinator Cell [phone] Office [phone]

## 2016-11-05 NOTE — H&P (Signed)
Physical Medicine and Rehabilitation Admission H&P       Chief Complaint  Patient presents with  . Code Stroke  : HPI: Philip Pruss [place] a 51 y.o.right handed malewith history of tobacco, [name] Per chart review and patient, patient lives with girlfriend independent prior to admission. Presented 11/02/2016 with right-sided weakness. Urine drug screen positive for cocaine and marijuana. CT reviewed, showing left basal ganglia infarct. Per report, MRI showed acute subacute nonhemorrhagic infarct involving the posterior limb of the internal capsule. 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 CTA of the head showed no emergent large vessel occlusion or severe stenosis. Echocardiogram with ejection fraction of 37% grade 1 diastolic dysfunction. Patient did not receive TPA. Repeat cranial CT scan 11/04/2016 secondary to complaints of increased numbness to the right side showed no evidence of progression no hemorrhagic conversion. Neurology consulted presently on aspirin for CVA prophylaxis. Subcutaneous Lovenox for DVT prophylaxis. Physical and occupational therapy evaluations completed with recommendations of physical medicine rehabilitation consult.Patient was admitted for a comprehensive rehab program  Review of Systems  Constitutional: Negative for chills and fever.  HENT: Negative for hearing loss.   Eyes: Negative for blurred vision and double vision.  Respiratory: Negative for cough and shortness of breath.   Cardiovascular: Negative for chest pain, palpitations and leg swelling.  Gastrointestinal: Positive for constipation. Negative for nausea and vomiting.  Genitourinary: Negative for dysuria, flank pain, frequency and hematuria.  Musculoskeletal: Positive for myalgias.  Skin: Negative for rash.  Neurological: Positive for dizziness, sensory change and weakness. Negative for  seizures.  All other systems reviewed and are negative.  History reviewed. No pertinent past medical history.      Past Surgical History:  Procedure Laterality Date  . arthroscopic surgery     History reviewed. No pertinent family history. Social History:  reports that he has been smoking Cigarettes.  He has been smoking about 0.50 packs per day. He has never used smokeless tobacco. He reports that he drinks alcohol. He reports that he does not use drugs. Allergies: No Known Allergies No prescriptions prior to admission.    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) Available Help at Discharge: Family, Available 24 hours/day Type of Home: Mobile home (double wide) Home Access: Ramped entrance Entrance Stairs-Rails: Right, Left Home Layout: One level Bathroom Shower/Tub: Gaffer, Chiropodist: Standard Bathroom Accessibility: Yes Home Equipment: Shower seat - built in Additional Comments: son requires physical assist and he is the one usually providing it  Lives With: Family   Functional History: Prior Function Level of Independence: Independent Comments: working full time, 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  Transfers Overall transfer level: Needs assistance Equipment used: None Transfers: Sit to/from Stand Sit to Stand: Min assist General transfer comment: Min A required to maintain balance upon standing  Ambulation/Gait Ambulation/Gait assistance: Mod assist Ambulation Distance (Feet): 20 Feet (x6) Assistive device: 1 person hand held assist (wrap around support with gait  belt) Gait Pattern/deviations: Step-to pattern, Decreased stride length, Decreased dorsiflexion - right, Ataxic, Trunk flexed General Gait Details: Pt presented with poor step cooridnation with RLE and decreased weight shifting  to the LLE. PT provided manual facilitation at the pelvis to promote trunk extension and increased weight shift toward LLE during R swing phase. Pt completed multiple trials of up to 80f of gait training Verbal cues provided to contract R quad prior to RLE stance to improve control. Pt able to complete 8-10 steps of controlled gait before demonstrating decreased coordination and control and required mod A from PT to improve stability. PT provided cues to stop and rest throughout with increased gait deviations to prevent reinforcement of incorrect gait pattern. Pt demonstrated improvedment with multiple gait trials with ability to ambulate 345fwhile maintaining steady gait. Gait velocity: decreased Gait velocity interpretation: Below normal speed for age/gender  ADL: ADL Overall ADL's : Needs assistance/impaired Eating/Feeding: Minimal assistance, Sitting Eating/Feeding Details (indicate cue type and reason): Provided built-up handles for utensils.  Grooming: Wash/dry face, Sitting, Wash/dry hands, Moderate assistance (trouble keeping washcloth in R hand) Upper Body Bathing: Sitting, Minimal assistance Lower Body Bathing: Sitting/lateral leans, Moderate assistance Upper Body Dressing : Sitting, Moderate assistance Lower Body Dressing: Moderate assistance, Sit to/from stand Lower Body Dressing Details (indicate cue type and reason): able to don/doff socks, but assist needed to pull pants up while standing Toilet Transfer: Moderate assistance, Comfort height toilet, Grab bars Toilet Transfer Details (indicate cue type and reason): heavy reliance with 1 HHA with therapist and verbal cues for right leg activation Toileting- Clothing Manipulation and Hygiene: Moderate  assistance Tub/ Shower Transfer: Moderate assistance Functional mobility during ADLs: Moderate assistance, Cueing for sequencing, Cueing for safety (1 HHA) General ADL Comments: Pt educated on use of tubing for built-up handles on utensils to improve independence with self-feeding. Improved independence when utilizing visual biofeedback. Able to bring hand to mouth with built-up utensil three times this session. 25% spillage noted with simulated self-feeding tasks.   Cognition: Cognition Overall Cognitive Status: Impaired/Different from baseline Arousal/Alertness: Awake/alert Orientation Level: Oriented X4 Attention: Sustained Sustained Attention: Appears intact Memory:  (mild deficits short-term recall) Safety/Judgment: Appears intact Cognition Arousal/Alertness: Awake/alert Behavior During Therapy: WFL for tasks assessed/performed, Impulsive (Impulsive with frustrated) Overall Cognitive Status: Impaired/Different from baseline Area of Impairment: Safety/judgement Safety/Judgement: Decreased awareness of safety General Comments: Pt impulsive with movement and this increases as he becomes frustrated. Educated pt on relaxation techniques to decrease frustration and related impulsivity which impacts his safety.   Physical Exam: Blood pressure (!) 160/81, pulse 92, temperature 97.7 F (36.5 C), temperature source Oral, resp. rate 18, height _0  (1.651 m), SpO2 100 %. Physical Exam  Vitals reviewed. Constitutional: He appears well-developed.  HENT:  Head: Normocephalic.  Eyes: EOM are normal.  Neck: Normal range of motion. Neck supple. No thyromegaly present.  Cardiovascular: Normal rate and regular rhythm.   Respiratory: Effort normal and breath sounds normal. No respiratory distress.  GI: Soft. Bowel sounds are normal. He exhibits no distension. There is no tenderness.  Skin. Warm and dry Muscular: well built/muscular Neurological: He is alertand oriented to person, place, and  time.  Follows full commands. Good sitting balance.  Fair awareness of deficits. Speech clear. Motor: LUE/LLE: 5/5 proximal to distal. RUE: 3/5 deltoid, biceps, triceps, wrist, HI RLE: 3/5 HF, KE, ADF/PF Sensation diminished to light touch RUE/RLE and right face DTRs 3+ RUE/RLE Psych: pleasant and appropriate.   Lab Results Last 48 Hours        Results for orders placed or performed during the hospital encounter of 11/02/16 (from the past 48 hour(s))  Hemoglobin A1c     Status: Abnormal  Collection Time: 11/03/16 12:24 PM  Result Value Ref Range   Hgb A1c MFr Bld 5.7 (H) 4.8 - 5.6 %    Comment: (NOTE)         Pre-diabetes: 5.7 - 6.4         Diabetes: >6.4         Glycemic control for adults with diabetes: <7.0    Mean Plasma Glucose 117 mg/dL    Comment: (NOTE) Performed At: [facility] [place], [place] [id number] Lindon Romp MD LE:[id number]   Basic metabolic panel     Status: Abnormal   Collection Time: 11/04/16  3:49 AM  Result Value Ref Range   Sodium 135 135 - 145 mmol/L   Potassium 3.5 3.5 - 5.1 mmol/L   Chloride 104 101 - 111 mmol/L   CO2 21 (L) 22 - 32 mmol/L   Glucose, Bld 122 (H) 65 - 99 mg/dL   BUN 11 6 - 20 mg/dL   Creatinine, Ser 1.01 0.61 - 1.24 mg/dL   Calcium 9.3 8.9 - 10.3 mg/dL   GFR calc non Af Amer >60 >60 mL/min   GFR calc Af Amer >60 >60 mL/min    Comment: (NOTE) The eGFR has been calculated using the CKD EPI equation. This calculation has not been validated in all clinical situations. eGFR's persistently <60 mL/min signify possible Chronic Kidney Disease.    Anion gap 10 5 - 15  Lipid panel     Status: Abnormal   Collection Time: 11/04/16  3:49 AM  Result Value Ref Range   Cholesterol 283 (H) 0 - 200 mg/dL   Triglycerides 388 (H) <150 mg/dL   HDL 39 (L) >40 mg/dL   Total CHOL/HDL Ratio 7.3 RATIO   VLDL 78 (H) 0 - 40 mg/dL   LDL Cholesterol 166 (H) 0 - 99 mg/dL    Comment:         Total Cholesterol/HDL:CHD Risk Coronary Heart Disease Risk Table                     Men   Women  1/2 Average Risk   3.4   3.3  Average Risk       5.0   4.4  2 X Average Risk   9.6   7.1  3 X Average Risk  23.4   11.0        Use the calculated Patient Ratio above and the CHD Risk Table to determine the patient's CHD Risk.        ATP III CLASSIFICATION (LDL):  <100     mg/dL   Optimal  100-129  mg/dL   Near or Above                    Optimal  130-159  mg/dL   Borderline  160-189  mg/dL   High  >190     mg/dL   Very High       Imaging Results (Last 48 hours)  Ct Head W Contrast  Result Date: 11/04/2016 CLINICAL DATA:  Right-sided weakness EXAM: CT HEAD WITHOUT CONTRAST TECHNIQUE: Contiguous axial images were obtained from the base of the skull through the vertex without intravenous contrast. COMPARISON:  Two days ago. FINDINGS: Brain: Acute infarct in the left basal ganglia and corona radiata shows no evidence of progression or hemorrhage. No evidence of new infarct. No hydrocephalus or mass effect. Vascular: No hyperdense vessel. Skull: No acute or aggressive finding. Sinuses/Orbits: Negative IMPRESSION:  Known acute infarct in the left basal ganglia and corona radiata. No evidence of progression. No hemorrhagic conversion. Electronically Signed   By: Monte Fantasia M.D.   On: 11/04/2016 08:20   Mr Brain Wo Contrast  Result Date: 11/03/2016 CLINICAL DATA:  Acute onset right-sided numbness. Symptoms began at 2:30 p.m. yesterday. EXAM: MRI HEAD WITHOUT CONTRAST MRA HEAD WITHOUT CONTRAST TECHNIQUE: Multiplanar, multiecho pulse sequences of the brain and surrounding structures were obtained without intravenous contrast. Angiographic images of the head were obtained using MRA technique without contrast. COMPARISON:  CT head without contrast 11/02/2016. CTA head and neck 11/02/2016. FINDINGS: MRI HEAD FINDINGS Brain: The diffusion-weighted images demonstrate an acute/ subacute  nonhemorrhagic infarct involving the posterior limb of the left internal capsule. T2 signal changes are associated. Other scattered subcortical T2 hyperintensities are present bilaterally. No focal hemorrhage or mass lesion is present. The ventricles are of normal size. No significant extra-axial fluid collection is present. Internal auditory canals are within normal limits bilaterally. The brainstem and cerebellum are normal. Vascular: Abnormal signal is present in the right vertebral artery, compatible with occluded flow. Skull and upper cervical spine: The skullbase is normal. The craniocervical junction is normal. The upper cervical spine is unremarkable. Midline sagittal structures are within normal limits. Sinuses/Orbits: Mild mucosal thickening is present in the maxillary sinuses and scattered throughout the ethmoid air cells. No fluid levels are present. Mastoid air cells are clear. The globes and orbits are unremarkable. MRA HEAD FINDINGS The internal carotid artery is are within normal limits from the high cervical segments through the ICA termini bilaterally. There is some attenuation of distal A1 segments and more prominently the distal left M1 segment. The MCA bifurcations are intact. Segmental narrowing is noted in the MCA branch vessels bilaterally, left greater than right. There is some attenuation of distal ACA branch vessels as well. The right vertebral artery is occluded with minimal flow in the distal segment. The left vertebral artery is intact. Basilar artery is patent. The right posterior cerebral artery is of fetal type. There is attenuation of distal PCA branch vessels bilaterally. IMPRESSION: 1. 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 2. Scattered subcortical T2 hyperintensities bilaterally are mildly advanced for age. These are nonspecific. Given the other ischemic disease, this likely reflects remote ischemia as well. 3. 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 4. Asymmetric atherosclerotic disease involving distal left M1 segment and left greater than right MCA branch vessels. These results were called by telephone at the time of interpretation on 11/03/2016 at 12:14 pm to Dr. Georgena Spurling, who verbally acknowledged these results. Electronically Signed   By: San Morelle M.D.   On: 11/03/2016 12:28   Mr [name] Head/brain DD Cm  Result Date: 11/03/2016 CLINICAL DATA:  Acute onset right-sided numbness. Symptoms began at 2:30 p.m. yesterday. EXAM: MRI HEAD WITHOUT CONTRAST MRA HEAD WITHOUT CONTRAST TECHNIQUE: Multiplanar, multiecho pulse sequences of the brain and surrounding structures were obtained without intravenous contrast. Angiographic images of the head were obtained using MRA technique without contrast. COMPARISON:  CT head without contrast 11/02/2016. CTA head and neck 11/02/2016. FINDINGS: MRI HEAD FINDINGS Brain: The diffusion-weighted images demonstrate an acute/ subacute nonhemorrhagic infarct involving the posterior limb of the left internal capsule. T2 signal changes are associated. Other scattered subcortical T2 hyperintensities are present bilaterally. No focal hemorrhage or mass lesion is present. The ventricles are of normal size. No significant extra-axial fluid collection is present. Internal  auditory canals are within normal limits bilaterally. The brainstem and cerebellum are normal. Vascular: Abnormal signal is present in the right vertebral artery, compatible with occluded flow. Skull and upper cervical spine: The skullbase is normal. The craniocervical junction is normal. The upper cervical spine is unremarkable. Midline sagittal structures are within normal limits. Sinuses/Orbits: Mild mucosal thickening is present in the maxillary sinuses and scattered throughout the ethmoid air cells. No fluid levels are present. Mastoid air cells are clear. The globes and orbits are  unremarkable. MRA HEAD FINDINGS The internal carotid artery is are within normal limits from the high cervical segments through the ICA termini bilaterally. There is some attenuation of distal A1 segments and more prominently the distal left M1 segment. The MCA bifurcations are intact. Segmental narrowing is noted in the MCA branch vessels bilaterally, left greater than right. There is some attenuation of distal ACA branch vessels as well. The right vertebral artery is occluded with minimal flow in the distal segment. The left vertebral artery is intact. Basilar artery is patent. The right posterior cerebral artery is of fetal type. There is attenuation of distal PCA branch vessels bilaterally. IMPRESSION: 1. 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 2. Scattered subcortical T2 hyperintensities bilaterally are mildly advanced for age. These are nonspecific. Given the other ischemic disease, this likely reflects remote ischemia as well. 3. 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 4. Asymmetric atherosclerotic disease involving distal left M1 segment and left greater than right MCA branch vessels. These results were called by telephone at the time of interpretation on 11/03/2016 at 12:14 pm to Dr. Georgena Spurling, who verbally acknowledged these results. Electronically Signed   By: San Morelle M.D.   On: 11/03/2016 12:28        Medical Problem List and Plan: 1.  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  DVT Prophylaxis/Anticoagulation: Subcutaneous Lovenox. Monitor platelet counts and any signs of bleeding 3. Pain Management: Robaxin as needed 4. Mood: Provide emotional support 5. Neuropsych: This patient is capable of making decisions on her own behalf. 6. Skin/Wound Care: Routine skin checks 7. Fluids/Electrolytes/Nutrition: Routine I&O  with follow up chemestries 8. Hyperlipidemia. Lipitor 9. Polysubstance abuse. Counseling    Post Admission Physician Evaluation: 1. Functional deficits secondary  to left internal capsule infarct. 2. Patient is admitted to receive collaborative, interdisciplinary care between the physiatrist, rehab nursing staff, and therapy team. 3. Patient's level of medical complexity and substantial therapy needs in context of that medical necessity cannot be provided at a lesser intensity of care such as a SNF. 4. Patient has experienced substantial functional loss from his/her baseline which was documented above under the "Functional History" and "Functional Status" headings.  Judging by the patient's diagnosis, physical exam, and functional history, the patient has potential for functional progress which will result in measurable gains while on inpatient rehab.  These gains will be of substantial and practical use upon discharge  in facilitating mobility and self-care at the household level. 5. Physiatrist will provide 24 hour management of medical needs as well as oversight of the therapy plan/treatment and provide guidance as appropriate regarding the interaction of the two. 6. The Preadmission Screening has been reviewed and patient status is unchanged unless otherwise stated above. 7. 24 hour rehab nursing will assist with bladder management, bowel management, safety, skin/wound  care, disease management, medication administration, pain management and patient education  and help integrate therapy concepts, techniques,education, etc. 8. PT will assess and treat for/with: Lower extremity strength, range of motion, stamina, balance, functional mobility, safety, adaptive techniques and equipment, NMR stroke education, ego support, community reintegration.   Goals are: mod I. 9. OT will assess and treat for/with: ADL's, functional mobility, safety, upper extremity strength, adaptive techniques and equipment,  NMR, stroke education, family ed, ego support.   Goals are: mod I. Therapy may proceed with showering this patient. 10. SLP will assess and treat for/with: n/a.  Goals are: n/a. 11. Case Management and Social Worker will assess and treat for psychological issues and discharge planning. 12. Team conference will be held weekly to assess progress toward goals and to determine barriers to discharge. 13. Patient will receive at least 3 hours of therapy per day at least 5 days per week. 14. ELOS: 12-15 days       15. Prognosis:  excellent     Meredith Staggers, MD, [facility] 11/05/2016  Cathlyn Parsons., PA-C 11/05/2016

## 2016-11-05 NOTE — Care Management Note (Signed)
Case Management Note  Patient Details  Name: Philip Howard MRN: [id number] Date of Birth: 07-Aug-1965  Subjective/Objective:                    Action/Plan: Pt discharging to CIR today. No further needs per CM.   Expected Discharge Date:  11/05/16               Expected Discharge Plan:  [place]  In-House Referral:     Discharge planning Services     Post Acute Care Choice:    Choice offered to:     DME Arranged:    DME Agency:     HH Arranged:    HH Agency:     Status of Service:  Completed, signed off  If discussed at [employer], dates discussed:    Additional Comments:  Pollie Friar, RN 11/05/2016, 3:29 PM

## 2016-11-05 NOTE — Progress Notes (Signed)
Pt c/o muscle cramps, PRN robaxin given, hot packs offered, pt repositioned and PRN Ben-Gay ointment ordered per protocol. Will continue to monitor

## 2016-11-05 NOTE — Progress Notes (Signed)
Physical Therapy Treatment Patient Details Name: Philip Howard MRN: [id number] DOB: 08/12/1965 Today's Date: 11/05/2016    History of Present Illness Pt is a 51 y.o. male with no significant past medical history. He presented with right-sided heaviness and numbness. UDS positive for cocaine and THC. 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    PT Comments    Patient seen for mobility progression. Focus of session on neuromuscular gait retraining and turn training. Patient tolerated well. Continues to require assist but shows consistent carry over of techniques with resultantant improvements in distance and quality of gait. Will continue to see and progress as tolerated. Rehab remains appropriate.  Follow Up Recommendations  CIR     Equipment Recommendations  Other (comment) (TBD)    Recommendations for Other Services Rehab consult     Precautions / Restrictions Precautions Precautions: Fall Restrictions Weight Bearing Restrictions: No    Mobility  Bed Mobility Overal bed mobility: Needs Assistance Bed Mobility: Supine to Sit;Sit to Supine     Supine to sit: Supervision Sit to supine: Supervision   General bed mobility comments: Supervision for safety.   Transfers Overall transfer level: Needs assistance Equipment used: None Transfers: Sit to/from Stand Sit to Stand: Min assist         General transfer comment: continues to required min assist to for elevation to upright, performed transfer training x5 during session from low surface chair with no arms, educated on technique to power up without UE push off  Ambulation/Gait Ambulation/Gait assistance: Mod assist Ambulation Distance (Feet): 40 Feet (x10) Assistive device: 1 person hand held assist (wrap around support with gait belt) Gait Pattern/deviations:  Step-to pattern;Decreased stride length;Decreased dorsiflexion - right;Ataxic;Trunk flexed Gait velocity: decreased Gait velocity interpretation: Below normal speed for age/gender General Gait Details: patient showing improvements in carry over of technique with gait retraining. Able to perform short distance with min guard to min assist for stability. Patient utilizing strategies for quad setting and weight shifting with improved consistency allowing for longer periods of steady gait performance.    Stairs            Wheelchair Mobility    Modified Rankin (Stroke Patients Only) Modified Rankin (Stroke Patients Only) Pre-Morbid Rankin Score: No symptoms Modified Rankin: Moderately severe disability     Balance Overall balance assessment: Needs assistance Sitting-balance support: No upper extremity supported;Feet supported Sitting balance-Leahy Scale: Fair       Standing balance-Leahy Scale: Fair Standing balance comment: able to static stand for breing periods without UE support, can not accept challenges at this time                            Cognition Arousal/Alertness: Awake/alert Behavior During Therapy: [facility] for tasks assessed/performed;Impulsive Overall Cognitive Status: Impaired/Different from baseline Area of Impairment: Safety/judgement                         Safety/Judgement: Decreased awareness of safety     General Comments: patient with improvements in receptivity today, eager to participate and progress mobility.      Exercises Other Exercises Other Exercises: Provided HEP for improved functional use of R UE to be completed 3 times/day for 10 repetitions each: lap slides with thumb facing up,  Other Exercises: Facilitated improved functional use of R UE with PNF  patterns to reach for self-care items. Noted R shoulder hiking and facilitated decreased upper trap hiking to promote improved scapular mobility with functional reaching.      General Comments        Pertinent Vitals/Pain Pain Assessment: No/denies pain Faces Pain Scale: Hurts even more    Home Living                      Prior Function            PT Goals (current goals can now be found in the care plan section) Acute Rehab PT Goals Patient Stated Goal: to go home PT Goal Formulation: With patient Time For Goal Achievement: 11/17/16 Potential to Achieve Goals: Good Progress towards PT goals: Progressing toward goals    Frequency    Min 4X/week      PT Plan Current plan remains appropriate    Co-evaluation              AM-PAC PT "6 Clicks" Daily Activity  Outcome Measure  Difficulty turning over in bed (including adjusting bedclothes, sheets and blankets)?: A Little Difficulty moving from lying on back to sitting on the side of the bed? : A Little Difficulty sitting down on and standing up from a chair with arms (e.g., wheelchair, bedside commode, etc,.)?: A Little Help needed moving to and from a bed to chair (including a wheelchair)?: A Lot Help needed walking in hospital room?: A Lot Help needed climbing 3-5 steps with a railing? : Total 6 Click Score: 14    End of Session Equipment Utilized During Treatment: Gait belt Activity Tolerance: Patient tolerated treatment well Patient left: in chair;with call bell/phone within reach;with chair alarm set;with family/visitor present Nurse Communication: Mobility status PT Visit Diagnosis: Difficulty in walking, not elsewhere classified (R26.2);Hemiplegia and hemiparesis Hemiplegia - Right/Left: Right Hemiplegia - dominant/non-dominant: Dominant Hemiplegia - caused by: Cerebral infarction     Time: 1001-1020 PT Time Calculation (min) (ACUTE ONLY): 19 min  Charges:  $Gait Training: 8-22 mins                    G Codes:       Alben Deeds, PT DPT  [place] 11/05/2016, 10:34 AM

## 2016-11-05 NOTE — Progress Notes (Signed)
Gerlean Ren Rehab Admission Coordinator Signed Physical Medicine and Rehabilitation  PMR Pre-admission Date of Service: 11/05/2016 11:58 AM  Related encounter: ED to Hosp-Admission (Discharged) from 11/02/2016 in [place]       [] Hide copied text PMR Admission Coordinator Pre-Admission Assessment  Patient: Philip Howard is an 51 y.o., male MRN: [id number] DOB: 02-22-1966 Height: 5\' 5"  (165.1 cm) Weight:                                                                                                                                                    Insurance Information HMO:    PPO:      PCP:      IPA:      80/20:      OTHER:  PRIMARY: Pt. Is self pay, uninsured; I have notified financial counselor Sherlyn Lees of need to visit pt.      Policy#:       Subscriber:  CM Name:       Phone#:      Fax#:  Pre-Cert#:       Employer:  Benefits:  Phone #:      Name:  Eff. Date:      Deduct:       Out of Pocket Max:       Life Max:  CIR:       SNF:  Outpatient:      Co-Pay:  Home Health:       Co-Pay:  DME:      Co-Pay:  Providers:  SECONDARY:       Policy#:       Subscriber:  CM Name:       Phone#:      Fax#:  Pre-Cert#:       Employer:  Benefits:  Phone #:      Name:  Eff. Date:      Deduct:       Out of Pocket Max:       Life Max:  CIR:       SNF:  Outpatient:      Co-Pay:  Home Health:       Co-Pay:  DME:      Co-Pay:   Medicaid Application Date:       Case Manager:  Disability Application Date:       Case Worker:   Emergency Contact Information        Contact Information    Name Relation Home Work Mobile   [place] Son [phone]       Current Medical History  Patient Admitting Diagnosis: Left internal capsule infarct. History of Present Illness: Philip [place] a 51 y.o.right handed malewith history of tobacco, [name] Per chart review and  patient, patient lives with girlfriend independent prior to admission. Presented  11/02/2016 with right-sided weakness. Urine drug screen positive for cocaine and marijuana. CT reviewed, showing left basal ganglia infarct. Per report, MRI showed acute subacute nonhemorrhagic infarct involving the posterior limb of the internal capsule. 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 CTA of the head showed no emergent large vessel occlusion or severe stenosis. Echocardiogram with ejection fraction of 66% grade 1 diastolic dysfunction. Patient did not receive TPA. Repeat cranial CT scan 11/04/2016 secondary to complaints of increased numbness to the right side showed no evidence of progression no hemorrhagic conversion. Neurology consulted presently on aspirin for CVA prophylaxis. Subcutaneous Lovenox for DVT prophylaxis. Physical and occupational therapy evaluations completed with recommendations of physical medicine rehabilitation consult.Patient was admitted for a comprehensive rehab program  Total: 2 [facility]  Past Medical History  History reviewed. No pertinent past medical history.  Family History  family history is not on file.  Prior Rehab/Hospitalizations:  Has the patient had major surgery during 100 days prior to admission? No  Current Medications   Current Facility-Administered Medications:  .  acetaminophen (TYLENOL) tablet 650 mg, 650 mg, Oral, Q4H PRN, 650 mg at 11/05/16 0925 **OR** acetaminophen (TYLENOL) solution 650 mg, 650 mg, Per Tube, Q4H PRN **OR** acetaminophen (TYLENOL) suppository 650 mg, 650 mg, Rectal, Q4H PRN, Opyd, Timothy S, MD .  aspirin suppository 300 mg, 300 mg, Rectal, Daily **OR** aspirin tablet 325 mg, 325 mg, Oral, Daily, Opyd, Ilene Qua, MD, 325 mg at 11/05/16 0925 .  atorvastatin (LIPITOR) tablet 40 mg, 40 mg, Oral, q1800, Rosalin Hawking, MD, 40 mg at 11/04/16 1730 .  enoxaparin (LOVENOX) injection 40 mg, 40 mg,  Subcutaneous, Q24H, Opyd, Ilene Qua, MD, 40 mg at 11/04/16 1730 .  methocarbamol (ROBAXIN) tablet 500 mg, 500 mg, Oral, Q8H PRN, Mariel Aloe, MD, 500 mg at 11/05/16 0925 .  MUSCLE RUB CREA, , Topical, PRN, Mariel Aloe, MD .  senna-docusate (Senokot-S) tablet 1 tablet, 1 tablet, Oral, QHS PRN, Opyd, Ilene Qua, MD  Patients Current Diet: Diet Heart Room service appropriate? Yes; Fluid consistency: Thin  Precautions / Restrictions Precautions Precautions: Fall Restrictions Weight Bearing Restrictions: No   Has the patient had 2 or more falls or a fall with injury in the past year?No  Prior Activity Level Community (5-7x/wk): Pt. goes out of the home daily.  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    Home Assistive Devices / Equipment Home Assistive Devices/Equipment: None Home Equipment: Shower seat - built in  Prior Device Use: Indicate devices/aids used by the patient prior to current illness, exacerbation or injury? None of the above  Prior Functional Level Prior Function Level of Independence: Independent Comments: working full time, 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  Self Care: Did the patient need help bathing, dressing, using the toilet or eating?  Independent  Indoor Mobility: Did the patient need assistance with walking from room to room (with or without device)? Independent  Stairs: Did the patient need assistance with internal or external stairs (with or without device)? Independent  Functional Cognition: Did the patient need help planning regular tasks such as shopping or remembering to take medications? Independent  Current Functional Level Cognition  Arousal/Alertness: Awake/alert Overall Cognitive Status: Impaired/Different from baseline Orientation Level: Oriented X4 Safety/Judgement: Decreased awareness of safety General Comments: patient with improvements in receptivity today, eager to participate and progress mobility. Attention:  Sustained Sustained Attention: Appears intact Memory:  (mild deficits short-term recall) Safety/Judgment: Appears intact  Extremity Assessment (includes Sensation/Coordination)  Upper Extremity Assessment: RUE deficits/detail RUE Deficits / Details: grossly 3+/5; unable to write name RUE Sensation: decreased light touch, decreased proprioception RUE Coordination: decreased fine motor, decreased gross motor  Lower Extremity Assessment: Defer to PT evaluation RLE Deficits / Details: gross muscle weakness RLE 4-/5 RLE Sensation: decreased light touch, decreased proprioception RLE Coordination: decreased fine motor, decreased gross motor    ADLs  Overall ADL's : Needs assistance/impaired Eating/Feeding: Minimal assistance, Sitting Eating/Feeding Details (indicate cue type and reason): Provided built-up handles for utensils.  Grooming: Wash/dry face, Sitting, Wash/dry hands, Moderate assistance (trouble keeping washcloth in R hand) Upper Body Bathing: Sitting, Minimal assistance Lower Body Bathing: Sitting/lateral leans, Moderate assistance Upper Body Dressing : Sitting, Moderate assistance Lower Body Dressing: Moderate assistance, Sit to/from stand Lower Body Dressing Details (indicate cue type and reason): able to don/doff socks, but assist needed to pull pants up while standing Toilet Transfer: Moderate assistance, Comfort height toilet, Grab bars Toilet Transfer Details (indicate cue type and reason): heavy reliance with 1 HHA with therapist and verbal cues for right leg activation Toileting- Clothing Manipulation and Hygiene: Moderate assistance Tub/ Shower Transfer: Moderate assistance Functional mobility during ADLs: Moderate assistance, Cueing for sequencing, Cueing for safety (1 HHA) General ADL Comments: Pt educated on use of tubing for built-up handles on utensils to improve independence with self-feeding. Improved independence when utilizing visual biofeedback. Able to  bring hand to mouth with built-up utensil three times this session. 25% spillage noted with simulated self-feeding tasks.     Mobility  Overal bed mobility: Needs Assistance Bed Mobility: Supine to Sit, Sit to Supine Supine to sit: Supervision Sit to supine: Supervision General bed mobility comments: Supervision for safety.     Transfers  Overall transfer level: Needs assistance Equipment used: None Transfers: Sit to/from Stand Sit to Stand: Min assist General transfer comment: continues to required min assist to for elevation to upright, performed transfer training x5 during session from low surface chair with no arms, educated on technique to power up without UE push off    Ambulation / Gait / Stairs / Wheelchair Mobility  Ambulation/Gait Ambulation/Gait assistance: Mod assist Ambulation Distance (Feet): 40 Feet (x10) Assistive device: 1 person hand held assist (wrap around support with gait belt) Gait Pattern/deviations: Step-to pattern, Decreased stride length, Decreased dorsiflexion - right, Ataxic, Trunk flexed General Gait Details: patient showing improvements in carry over of technique with gait retraining. Able to perform short distance with min guard to min assist for stability. Patient utilizing strategies for quad setting and weight shifting with improved consistency allowing for longer periods of steady gait performance.  Gait velocity: decreased Gait velocity interpretation: Below normal speed for age/gender    Posture / Balance Dynamic Sitting Balance Sitting balance - Comments: significant LOB and inability to reposition ith dynamic sitting, but able to maintain static sitting balance. Noted poor postural alignment  Balance Overall balance assessment: Needs assistance Sitting-balance support: No upper extremity supported, Feet supported Sitting balance-Leahy Scale: Fair Sitting balance - Comments: significant LOB and inability to reposition ith dynamic sitting,  but able to maintain static sitting balance. Noted poor postural alignment  Standing balance-Leahy Scale: Fair Standing balance comment: able to static stand for breing periods without UE support, can not accept challenges at this time    Special needs/care consideration BiPAP/CPAP   no CPM   no Continuous Drip IV   no Dialysis    no        Life Vest  no Oxygen   no Special Bed    no Trach Size  n/a Wound Vac (area)  no       Skin   WDL                              Bowel mgmt: last BM 11/03/16  ,incontinent per pt.  Bladder mgmt: continent, assist to bathroom Diabetic mgmt n/a     Previous Home Environment Living Arrangements: Spouse/significant other, Children (son with impairment who he is caregiver for)  Lives With: Family Available Help at Discharge: Family, Available 24 hours/day Type of Home: Mobile home (double wide) Home Layout: One level Home Access: Ramped entrance Entrance Stairs-Rails: Right, Left Bathroom Shower/Tub: Gaffer, Chiropodist: Standard Bathroom Accessibility: Yes How Accessible: Accessible via walker Home Care Services: No Additional Comments: son requires physical assist and he is the one usually providing it  Discharge Living Setting Plans for Discharge Living Setting: Patient's home Type of Home at Discharge: Mobile home Discharge Home Layout: One level Discharge Home Access: [place] entrance Discharge Bathroom Shower/Tub: Tub/shower unit, Walk-in shower Discharge Bathroom Toilet: Standard Discharge Bathroom Accessibility: Yes How Accessible: Accessible via walker Does the patient have any problems obtaining your medications?: No  Social/Family/Support Systems Patient Roles: Partner, Parent Anticipated Caregiver: Pt's girlfriend lives with patient and works full time.  Pt's sister will care for pt. while girlfriend is at work.  Sister indicates pt. has 6 sisters and there will be plenty of  caregivers. Anticipated Caregiver's Contact Information: Theresia Lo, sister, [phone]; Ardelle Balls, girlfriend, [phone] Ability/Limitations of Caregiver: girlfriend works full time; no limitations with sister Caregiver Availability: 24/7 Discharge Plan Discussed with Primary Caregiver: Yes Is Caregiver In Agreement with Plan?: Yes Does Caregiver/Family have Issues with Lodging/Transportation while Pt is in Rehab?: No  I informed pt. That he is currently self pay but that I would reach out to financial counselor.  He desires to move forward with IP Rehab admission.     Goals/Additional Needs Patient/Family Goal for Rehab: supervision and minimal assistance PT/OT, n/a SLP Expected length of stay: 14-19 days Cultural Considerations: "[facility]" Dietary Needs: heart diet, thin liquids Equipment Needs: TBA Additional Information: Pt's son was hit by a car 8 years ago and is a brain injury patient who is ambulatory with assist.  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 Pt/Family Agrees to Admission and willing to participate: Yes Program Orientation Provided & Reviewed with Pt/Caregiver Including Roles  & Responsibilities: Yes   Decrease burden of Care through IP rehab admission:    Possible need for SNF placement upon discharge:   Not expected   Patient Condition: This patient's condition remains as documented in the consult dated 11/05/16 , in which the Rehabilitation Physician determined and documented that the patient's condition is appropriate for intensive rehabilitative care in an inpatient rehabilitation facility. Will admit to inpatient rehab today.  Preadmission Screen Completed By:  Gerlean Ren, 11/05/2016 11:58 AM ______________________________________________________________________   Discussed status with Dr.  Naaman Plummer on 11/05/16 at  1348  and received telephone approval for admission today.  Admission Coordinator:  Gerlean Ren, time [id number]  11/05/16       Cosigned by: Meredith Staggers, MD at 11/05/2016 1:52 PM  Revision History

## 2016-11-05 NOTE — Progress Notes (Signed)
Philip Arn, MD Physician Signed Physical Medicine and Rehabilitation  Consult Note Date of Service: 11/05/2016 6:59 AM  Related encounter: ED to Hosp-Admission (Discharged) from 11/02/2016 in [place] All Collapse All   [] Hide copied text [] Hover for attribution information      Physical Medicine and Rehabilitation Consult Reason for Consult: Right side weakness Referring Physician: Internal medicine   HPI: Philip Howard is a 51 y.o. right handed male with history of tobacco, [name] Per chart review and patient, patient lives with girlfriend independent prior to admission. Presented 11/02/2016 with right-sided weakness. Urine drug screen positive for cocaine and marijuana. CT reviewed, showing left basal ganglia infarct. Per report, MRI showed acute subacute nonhemorrhagic infarct involving the posterior limb of the internal capsule. 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 CTA of the head showed no emergent large vessel occlusion or severe stenosis. Echocardiogram with ejection fraction of 34% grade 1 diastolic dysfunction. Patient did not receive TPA. Repeat cranial CT scan 11/04/2016 secondary to complaints of increased numbness to the right side showed no evidence of progression no hemorrhagic conversion. Neurology consulted presently on aspirin for CVA prophylaxis. Subcutaneous Lovenox for DVT prophylaxis. Physical and occupational therapy evaluations completed with recommendations of physical medicine rehabilitation consult.   Review of Systems  Constitutional: Negative for chills and fever.  Eyes: Negative for blurred vision and double vision.  Respiratory: Negative for cough and shortness of breath.   Cardiovascular: Negative for chest pain and palpitations.  Gastrointestinal: Positive for constipation. Negative  for nausea and vomiting.  Genitourinary: Negative for dysuria, flank pain and hematuria.  Musculoskeletal: Positive for myalgias.  Skin: Negative for rash.  Neurological: Positive for dizziness, sensory change, focal weakness and weakness. Negative for seizures.  All other systems reviewed and are negative.  History reviewed. No pertinent past medical history.      Past Surgical History:  Procedure Laterality Date  . arthroscopic surgery     History reviewed. No pertinent family history. Social History:  reports that he has been smoking Cigarettes.  He has been smoking about 0.50 packs per day. He has never used smokeless tobacco. He reports that he drinks alcohol. He reports that he does not use drugs. Allergies: No Known Allergies No prescriptions prior to admission.    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) Available Help at Discharge: Family, Available 24 hours/day Type of Home: Mobile home (double wide) Home Access: Ramped entrance Entrance Stairs-Rails: Right, Left Home Layout: One level Bathroom Shower/Tub: Gaffer, Chiropodist: Standard Bathroom Accessibility: Yes Home Equipment: Shower seat - built in Additional Comments: son requires physical assist and he is the one usually providing it  Lives With: Family  Functional History: Prior Function Level of Independence: Independent Comments: working full time, 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  Transfers Overall transfer level: Needs assistance Equipment used: None Transfers: Sit to/from Stand Sit to Stand: Min assist General  transfer comment: Min A required to maintain balance upon standing  Ambulation/Gait Ambulation/Gait assistance: Mod assist Ambulation Distance (Feet): 20 Feet (x6) Assistive device: 1 person hand  held assist (wrap around support with gait belt) Gait Pattern/deviations: Step-to pattern, Decreased stride length, Decreased dorsiflexion - right, Ataxic, Trunk flexed General Gait Details: Pt presented with poor step cooridnation with RLE and decreased weight shifting to the LLE. PT provided manual facilitation at the pelvis to promote trunk extension and increased weight shift toward LLE during R swing phase. Pt completed multiple trials of up to 74ft of gait training Verbal cues provided to contract R quad prior to RLE stance to improve control. Pt able to complete 8-10 steps of controlled gait before demonstrating decreased coordination and control and required mod A from PT to improve stability. PT provided cues to stop and rest throughout with increased gait deviations to prevent reinforcement of incorrect gait pattern. Pt demonstrated improvedment with multiple gait trials with ability to ambulate 29ft while maintaining steady gait. Gait velocity: decreased Gait velocity interpretation: Below normal speed for age/gender  ADL: ADL Overall ADL's : Needs assistance/impaired Eating/Feeding: Minimal assistance, Sitting Eating/Feeding Details (indicate cue type and reason): Provided built-up handles for utensils.  Grooming: Wash/dry face, Sitting, Wash/dry hands, Moderate assistance (trouble keeping washcloth in R hand) Upper Body Bathing: Sitting, Minimal assistance Lower Body Bathing: Sitting/lateral leans, Moderate assistance Upper Body Dressing : Sitting, Moderate assistance Lower Body Dressing: Moderate assistance, Sit to/from stand Lower Body Dressing Details (indicate cue type and reason): able to don/doff socks, but assist needed to pull pants up while standing Toilet Transfer: Moderate  assistance, Comfort height toilet, Grab bars Toilet Transfer Details (indicate cue type and reason): heavy reliance with 1 HHA with therapist and verbal cues for right leg activation Toileting- Clothing Manipulation and Hygiene: Moderate assistance Tub/ Shower Transfer: Moderate assistance Functional mobility during ADLs: Moderate assistance, Cueing for sequencing, Cueing for safety (1 HHA) General ADL Comments: Pt educated on use of tubing for built-up handles on utensils to improve independence with self-feeding. Improved independence when utilizing visual biofeedback. Able to bring hand to mouth with built-up utensil three times this session. 25% spillage noted with simulated self-feeding tasks.   Cognition: Cognition Overall Cognitive Status: Impaired/Different from baseline Arousal/Alertness: Awake/alert Orientation Level: Oriented X4 Attention: Sustained Sustained Attention: Appears intact Memory:  (mild deficits short-term recall) Safety/Judgment: Appears intact Cognition Arousal/Alertness: Awake/alert Behavior During Therapy: WFL for tasks assessed/performed, Impulsive (Impulsive with frustrated) Overall Cognitive Status: Impaired/Different from baseline Area of Impairment: Safety/judgement Safety/Judgement: Decreased awareness of safety General Comments: Pt impulsive with movement and this increases as he becomes frustrated. Educated pt on relaxation techniques to decrease frustration and related impulsivity which impacts his safety.   Blood pressure (!) 152/99, pulse 74, temperature 97.6 F (36.4 C), temperature source Oral, resp. rate 20, height 5\' 5"  (1.651 m), SpO2 99 %. Physical Exam  Vitals reviewed. Constitutional: He is oriented to person, place, and time. He appears well-developed and well-nourished.  HENT:  Head: Normocephalic and atraumatic.  Eyes: Conjunctivae and EOM are normal. Right eye exhibits no discharge. Left eye exhibits no discharge.  Neck: Normal  range of motion. Neck supple. No thyromegaly present.  Cardiovascular: Normal rate, regular rhythm and normal heart sounds.   Respiratory: Effort normal and breath sounds normal. No respiratory distress.  GI: Soft. Bowel sounds are normal. He exhibits no distension.  Musculoskeletal: He exhibits no edema or tenderness.  Neurological: He is alert and oriented to person, place, and time.  Follows full commands.  Fair awareness of deficits. Motor: LUE/LLE: 5/5 proximal to distal RUE: 4-/5 proximal to distal RLE: 4/5 proximal to distal Sensation diminished to light  touch RUE/RLE DTRs 3+ RUE/RLE  Skin: Skin is warm and dry.  Psychiatric: He has a normal mood and affect. His behavior is normal.    Lab Results Last 24 Hours  No results found for this or any previous visit (from the past 24 hour(s)).    Imaging Results (Last 48 hours)  Ct Head W Contrast  Result Date: 11/04/2016 CLINICAL DATA:  Right-sided weakness EXAM: CT HEAD WITHOUT CONTRAST TECHNIQUE: Contiguous axial images were obtained from the base of the skull through the vertex without intravenous contrast. COMPARISON:  Two days ago. FINDINGS: Brain: Acute infarct in the left basal ganglia and corona radiata shows no evidence of progression or hemorrhage. No evidence of new infarct. No hydrocephalus or mass effect. Vascular: No hyperdense vessel. Skull: No acute or aggressive finding. Sinuses/Orbits: Negative IMPRESSION: Known acute infarct in the left basal ganglia and corona radiata. No evidence of progression. No hemorrhagic conversion. Electronically Signed   By: Monte Fantasia M.D.   On: 11/04/2016 08:20   Mr Brain Wo Contrast  Result Date: 11/03/2016 CLINICAL DATA:  Acute onset right-sided numbness. Symptoms began at 2:30 p.m. yesterday. EXAM: MRI HEAD WITHOUT CONTRAST MRA HEAD WITHOUT CONTRAST TECHNIQUE: Multiplanar, multiecho pulse sequences of the brain and surrounding structures were obtained without intravenous  contrast. Angiographic images of the head were obtained using MRA technique without contrast. COMPARISON:  CT head without contrast 11/02/2016. CTA head and neck 11/02/2016. FINDINGS: MRI HEAD FINDINGS Brain: The diffusion-weighted images demonstrate an acute/ subacute nonhemorrhagic infarct involving the posterior limb of the left internal capsule. T2 signal changes are associated. Other scattered subcortical T2 hyperintensities are present bilaterally. No focal hemorrhage or mass lesion is present. The ventricles are of normal size. No significant extra-axial fluid collection is present. Internal auditory canals are within normal limits bilaterally. The brainstem and cerebellum are normal. Vascular: Abnormal signal is present in the right vertebral artery, compatible with occluded flow. Skull and upper cervical spine: The skullbase is normal. The craniocervical junction is normal. The upper cervical spine is unremarkable. Midline sagittal structures are within normal limits. Sinuses/Orbits: Mild mucosal thickening is present in the maxillary sinuses and scattered throughout the ethmoid air cells. No fluid levels are present. Mastoid air cells are clear. The globes and orbits are unremarkable. MRA HEAD FINDINGS The internal carotid artery is are within normal limits from the high cervical segments through the ICA termini bilaterally. There is some attenuation of distal A1 segments and more prominently the distal left M1 segment. The MCA bifurcations are intact. Segmental narrowing is noted in the MCA branch vessels bilaterally, left greater than right. There is some attenuation of distal ACA branch vessels as well. The right vertebral artery is occluded with minimal flow in the distal segment. The left vertebral artery is intact. Basilar artery is patent. The right posterior cerebral artery is of fetal type. There is attenuation of distal PCA branch vessels bilaterally. IMPRESSION: 1. Acute/subacute nonhemorrhagic  infarct involving the posterior limb of the left internal capsule. 2. Scattered subcortical T2 hyperintensities bilaterally are mildly advanced for age. These are nonspecific. Given the other ischemic disease, this likely reflects remote ischemia as well. 3. 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 4. Asymmetric atherosclerotic disease involving distal left M1 segment and left greater than right MCA branch vessels. These results were called by telephone at the time of interpretation on 11/03/2016 at 12:14 pm to Dr. Georgena Spurling, who verbally acknowledged these results. Electronically Signed   By:  San Morelle M.D.   On: 11/03/2016 12:28   Mr [name] Head/brain KG Cm  Result Date: 11/03/2016 CLINICAL DATA:  Acute onset right-sided numbness. Symptoms began at 2:30 p.m. yesterday. EXAM: MRI HEAD WITHOUT CONTRAST MRA HEAD WITHOUT CONTRAST TECHNIQUE: Multiplanar, multiecho pulse sequences of the brain and surrounding structures were obtained without intravenous contrast. Angiographic images of the head were obtained using MRA technique without contrast. COMPARISON:  CT head without contrast 11/02/2016. CTA head and neck 11/02/2016. FINDINGS: MRI HEAD FINDINGS Brain: The diffusion-weighted images demonstrate an acute/ subacute nonhemorrhagic infarct involving the posterior limb of the left internal capsule. T2 signal changes are associated. Other scattered subcortical T2 hyperintensities are present bilaterally. No focal hemorrhage or mass lesion is present. The ventricles are of normal size. No significant extra-axial fluid collection is present. Internal auditory canals are within normal limits bilaterally. The brainstem and cerebellum are normal. Vascular: Abnormal signal is present in the right vertebral artery, compatible with occluded flow. Skull and upper cervical spine: The skullbase is normal. The craniocervical junction is normal. The upper cervical spine is  unremarkable. Midline sagittal structures are within normal limits. Sinuses/Orbits: Mild mucosal thickening is present in the maxillary sinuses and scattered throughout the ethmoid air cells. No fluid levels are present. Mastoid air cells are clear. The globes and orbits are unremarkable. MRA HEAD FINDINGS The internal carotid artery is are within normal limits from the high cervical segments through the ICA termini bilaterally. There is some attenuation of distal A1 segments and more prominently the distal left M1 segment. The MCA bifurcations are intact. Segmental narrowing is noted in the MCA branch vessels bilaterally, left greater than right. There is some attenuation of distal ACA branch vessels as well. The right vertebral artery is occluded with minimal flow in the distal segment. The left vertebral artery is intact. Basilar artery is patent. The right posterior cerebral artery is of fetal type. There is attenuation of distal PCA branch vessels bilaterally. IMPRESSION: 1. 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 2. Scattered subcortical T2 hyperintensities bilaterally are mildly advanced for age. These are nonspecific. Given the other ischemic disease, this likely reflects remote ischemia as well. 3. 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 4. Asymmetric atherosclerotic disease involving distal left M1 segment and left greater than right MCA branch vessels. These results were called by telephone at the time of interpretation on 11/03/2016 at 12:14 pm to Dr. Georgena Spurling, who verbally acknowledged these results. Electronically Signed   By: San Morelle M.D.   On: 11/03/2016 12:28     Assessment/Plan: Diagnosis: Left internal capsule infarct. Labs and images independently reviewed.  Records reviewed and summated above. Stroke: Continue secondary stroke prophylaxis and Risk Factor Modification listed  below:   Antiplatelet therapy:   Blood Pressure Management:  Continue current medication with prn's with permisive HTN per primary team Statin Agent:   Prediabetes management:   Tobacco abuse:   Left sided hemiparesis: fit for orthosis to prevent contractures (resting hand splint for day, wrist cock up splint at night, etc) Motor recovery: Fluoxetine   1. Does the need for close, 24 hr/day medical supervision in concert with the patient's rehab needs make it unreasonable for this patient to be served in a less intensive setting? Yes 2. Co-Morbidities requiring supervision/potential complications: tobacco abuse (counse), polysubstance abuse (counsel), diastolic CHF (monitor for signs/symptoms of fluid overload), hypokalemia (continue to monitor and replete as necessary), HLD (cont meds)  3. Due to safety, disease management and patient education, does the patient require 24 hr/day rehab nursing? Yes 4. Does the patient require coordinated care of a physician, rehab nurse, PT (1-25 hrs/day, 5 days/week) and OT (1-2 hrs/day, 5 days/week) to address physical and functional deficits in the context of the above medical diagnosis(es)? Yes Addressing deficits in the following areas: balance, endurance, locomotion, strength, transferring, bathing, dressing, toileting and psychosocial support 5. Can the patient actively participate in an intensive therapy program of at least 3 hrs of therapy per day at least 5 days per week? Yes 6. The potential for patient to make measurable gains while on inpatient rehab is excellent 7. Anticipated functional outcomes upon discharge from inpatient rehab are supervision and min assist  with PT, supervision and min assist with OT, supervision and min assist with SLP. 8. Estimated rehab length of stay to reach the above functional goals is: 14-19 days. 9. Anticipated D/C setting: Home 10. Anticipated post D/C treatments: HH therapy and Home excercise program 11. Overall  Rehab/Functional Prognosis: good  RECOMMENDATIONS: This patient's condition is appropriate for continued rehabilitative care in the following setting: CIR Patient has agreed to participate in recommended program. Yes Note that insurance prior authorization may be required for reimbursement for recommended care.  Comment: Rehab Admissions Coordinator to follow up.  Delice Lesch, MD, Mellody Drown Cathlyn Parsons., PA-C 11/05/2016    Revision History                        Routing History

## 2016-11-05 NOTE — H&P (Signed)
Physical Medicine and Rehabilitation Admission H&P    Chief Complaint  Patient presents with  . Code Stroke  : HPI: Philip Howard is a 51 y.o. right handed male with history of tobacco, [name] Per chart review and patient, patient lives with girlfriend independent prior to admission. Presented 11/02/2016 with right-sided weakness. Urine drug screen positive for cocaine and marijuana. CT reviewed, showing left basal ganglia infarct. Per report, MRI showed acute subacute nonhemorrhagic infarct involving the posterior limb of the internal capsule. 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 CTA of the head showed no emergent large vessel occlusion or severe stenosis. Echocardiogram with ejection fraction of 56% grade 1 diastolic dysfunction. Patient did not receive TPA. Repeat cranial CT scan 11/04/2016 secondary to complaints of increased numbness to the right side showed no evidence of progression no hemorrhagic conversion. Neurology consulted presently on aspirin for CVA prophylaxis. Subcutaneous Lovenox for DVT prophylaxis. Physical and occupational therapy evaluations completed with recommendations of physical medicine rehabilitation consult.Patient was admitted for a comprehensive rehab program  Review of Systems  Constitutional: Negative for chills and fever.  HENT: Negative for hearing loss.   Eyes: Negative for blurred vision and double vision.  Respiratory: Negative for cough and shortness of breath.   Cardiovascular: Negative for chest pain, palpitations and leg swelling.  Gastrointestinal: Positive for constipation. Negative for nausea and vomiting.  Genitourinary: Negative for dysuria, flank pain, frequency and hematuria.  Musculoskeletal: Positive for myalgias.  Skin: Negative for rash.  Neurological: Positive for dizziness, sensory change and weakness. Negative for seizures.  All  other systems reviewed and are negative.  History reviewed. No pertinent past medical history. Past Surgical History:  Procedure Laterality Date  . arthroscopic surgery     History reviewed. No pertinent family history. Social History:  reports that he has been smoking Cigarettes.  He has been smoking about 0.50 packs per day. He has never used smokeless tobacco. He reports that he drinks alcohol. He reports that he does not use drugs. Allergies: No Known Allergies No prescriptions prior to admission.    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) Available Help at Discharge: Family, Available 24 hours/day Type of Home: Mobile home (double wide) Home Access: Ramped entrance Entrance Stairs-Rails: Right, Left Home Layout: One level Bathroom Shower/Tub: Gaffer, Chiropodist: Standard Bathroom Accessibility: Yes Home Equipment: Shower seat - built in Additional Comments: son requires physical assist and he is the one usually providing it  Lives With: Family   Functional History: Prior Function Level of Independence: Independent Comments: working full time, 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  Transfers Overall transfer level: Needs assistance Equipment used: None Transfers: Sit to/from Stand Sit to Stand: Min assist General transfer comment: Min A required to maintain balance upon standing  Ambulation/Gait Ambulation/Gait assistance: Mod assist Ambulation Distance (Feet): 20 Feet (x6) Assistive device: 1 person hand held assist (wrap around support with gait belt) Gait Pattern/deviations:  Step-to pattern, Decreased stride length, Decreased dorsiflexion - right, Ataxic, Trunk flexed General Gait Details: Pt presented with poor step cooridnation with RLE and decreased weight shifting to the LLE. PT provided  manual facilitation at the pelvis to promote trunk extension and increased weight shift toward LLE during R swing phase. Pt completed multiple trials of up to 82f of gait training Verbal cues provided to contract R quad prior to RLE stance to improve control. Pt able to complete 8-10 steps of controlled gait before demonstrating decreased coordination and control and required mod A from PT to improve stability. PT provided cues to stop and rest throughout with increased gait deviations to prevent reinforcement of incorrect gait pattern. Pt demonstrated improvedment with multiple gait trials with ability to ambulate 36fwhile maintaining steady gait. Gait velocity: decreased Gait velocity interpretation: Below normal speed for age/gender    ADL: ADL Overall ADL's : Needs assistance/impaired Eating/Feeding: Minimal assistance, Sitting Eating/Feeding Details (indicate cue type and reason): Provided built-up handles for utensils.  Grooming: Wash/dry face, Sitting, Wash/dry hands, Moderate assistance (trouble keeping washcloth in R hand) Upper Body Bathing: Sitting, Minimal assistance Lower Body Bathing: Sitting/lateral leans, Moderate assistance Upper Body Dressing : Sitting, Moderate assistance Lower Body Dressing: Moderate assistance, Sit to/from stand Lower Body Dressing Details (indicate cue type and reason): able to don/doff socks, but assist needed to pull pants up while standing Toilet Transfer: Moderate assistance, Comfort height toilet, Grab bars Toilet Transfer Details (indicate cue type and reason): heavy reliance with 1 HHA with therapist and verbal cues for right leg activation Toileting- Clothing Manipulation and Hygiene: Moderate assistance Tub/ Shower Transfer:  Moderate assistance Functional mobility during ADLs: Moderate assistance, Cueing for sequencing, Cueing for safety (1 HHA) General ADL Comments: Pt educated on use of tubing for built-up handles on utensils to improve independence with self-feeding. Improved independence when utilizing visual biofeedback. Able to bring hand to mouth with built-up utensil three times this session. 25% spillage noted with simulated self-feeding tasks.   Cognition: Cognition Overall Cognitive Status: Impaired/Different from baseline Arousal/Alertness: Awake/alert Orientation Level: Oriented X4 Attention: Sustained Sustained Attention: Appears intact Memory:  (mild deficits short-term recall) Safety/Judgment: Appears intact Cognition Arousal/Alertness: Awake/alert Behavior During Therapy: WFL for tasks assessed/performed, Impulsive (Impulsive with frustrated) Overall Cognitive Status: Impaired/Different from baseline Area of Impairment: Safety/judgement Safety/Judgement: Decreased awareness of safety General Comments: Pt impulsive with movement and this increases as he becomes frustrated. Educated pt on relaxation techniques to decrease frustration and related impulsivity which impacts his safety.   Physical Exam: Blood pressure (!) 160/81, pulse 92, temperature 97.7 F (36.5 C), temperature source Oral, resp. rate 18, height '5\' 5"'$  (1.651 m), SpO2 100 %. Physical Exam  Vitals reviewed. Constitutional: He appears well-developed.  HENT:  Head: Normocephalic.  Eyes: EOM are normal.  Neck: Normal range of motion. Neck supple. No thyromegaly present.  Cardiovascular: Normal rate and regular rhythm.   Respiratory: Effort normal and breath sounds normal. No respiratory distress.  GI: Soft. Bowel sounds are normal. He exhibits no distension. There is no tenderness.  Skin. Warm and dry Muscular: well built/muscular Neurological: He is alert and oriented to person, place, and time.  Follows full commands. Good  sitting balance.  Fair awareness of deficits. Speech clear. Motor: LUE/LLE: 5/5 proximal to distal. RUE: 3/5 deltoid, biceps, triceps, wrist, HI RLE: 3/5 HF, KE, ADF/PF Sensation diminished to light touch RUE/RLE and right face DTRs 3+ RUE/RLE  Psych: pleasant and appropriate.   Results for orders placed or performed during the hospital encounter of 11/02/16 (from the past 48 hour(s))  Hemoglobin A1c     Status: Abnormal   Collection Time: 11/03/16 12:24 PM  Result Value Ref Range  Hgb A1c MFr Bld 5.7 (H) 4.8 - 5.6 %    Comment: (NOTE)         Pre-diabetes: 5.7 - 6.4         Diabetes: >6.4         Glycemic control for adults with diabetes: <7.0    Mean Plasma Glucose 117 mg/dL    Comment: (NOTE) Performed At: [facility] [place], [place] [id number] Lindon Romp MD [id number]   Basic metabolic panel     Status: Abnormal   Collection Time: 11/04/16  3:49 AM  Result Value Ref Range   Sodium 135 135 - 145 mmol/L   Potassium 3.5 3.5 - 5.1 mmol/L   Chloride 104 101 - 111 mmol/L   CO2 21 (L) 22 - 32 mmol/L   Glucose, Bld 122 (H) 65 - 99 mg/dL   BUN 11 6 - 20 mg/dL   Creatinine, Ser 1.01 0.61 - 1.24 mg/dL   Calcium 9.3 8.9 - 10.3 mg/dL   GFR calc non Af Amer >60 >60 mL/min   GFR calc Af Amer >60 >60 mL/min    Comment: (NOTE) The eGFR has been calculated using the CKD EPI equation. This calculation has not been validated in all clinical situations. eGFR's persistently <60 mL/min signify possible Chronic Kidney Disease.    Anion gap 10 5 - 15  Lipid panel     Status: Abnormal   Collection Time: 11/04/16  3:49 AM  Result Value Ref Range   Cholesterol 283 (H) 0 - 200 mg/dL   Triglycerides 388 (H) <150 mg/dL   HDL 39 (L) >40 mg/dL   Total CHOL/HDL Ratio 7.3 RATIO   VLDL 78 (H) 0 - 40 mg/dL   LDL Cholesterol 166 (H) 0 - 99 mg/dL    Comment:        Total Cholesterol/HDL:CHD Risk Coronary Heart Disease Risk Table                     Men    Women  1/2 Average Risk   3.4   3.3  Average Risk       5.0   4.4  2 X Average Risk   9.6   7.1  3 X Average Risk  23.4   11.0        Use the calculated Patient Ratio above and the CHD Risk Table to determine the patient's CHD Risk.        ATP III CLASSIFICATION (LDL):  <100     mg/dL   Optimal  100-129  mg/dL   Near or Above                    Optimal  130-159  mg/dL   Borderline  160-189  mg/dL   High  >190     mg/dL   Very High    Ct Head W Contrast  Result Date: 11/04/2016 CLINICAL DATA:  Right-sided weakness EXAM: CT HEAD WITHOUT CONTRAST TECHNIQUE: Contiguous axial images were obtained from the base of the skull through the vertex without intravenous contrast. COMPARISON:  Two days ago. FINDINGS: Brain: Acute infarct in the left basal ganglia and corona radiata shows no evidence of progression or hemorrhage. No evidence of new infarct. No hydrocephalus or mass effect. Vascular: No hyperdense vessel. Skull: No acute or aggressive finding. Sinuses/Orbits: Negative IMPRESSION: Known acute infarct in the left basal ganglia and corona radiata. No evidence of progression. No hemorrhagic conversion. Electronically Signed  By: Monte Fantasia M.D.   On: 11/04/2016 08:20   Mr Brain Wo Contrast  Result Date: 11/03/2016 CLINICAL DATA:  Acute onset right-sided numbness. Symptoms began at 2:30 p.m. yesterday. EXAM: MRI HEAD WITHOUT CONTRAST MRA HEAD WITHOUT CONTRAST TECHNIQUE: Multiplanar, multiecho pulse sequences of the brain and surrounding structures were obtained without intravenous contrast. Angiographic images of the head were obtained using MRA technique without contrast. COMPARISON:  CT head without contrast 11/02/2016. CTA head and neck 11/02/2016. FINDINGS: MRI HEAD FINDINGS Brain: The diffusion-weighted images demonstrate an acute/ subacute nonhemorrhagic infarct involving the posterior limb of the left internal capsule. T2 signal changes are associated. Other scattered subcortical T2  hyperintensities are present bilaterally. No focal hemorrhage or mass lesion is present. The ventricles are of normal size. No significant extra-axial fluid collection is present. Internal auditory canals are within normal limits bilaterally. The brainstem and cerebellum are normal. Vascular: Abnormal signal is present in the right vertebral artery, compatible with occluded flow. Skull and upper cervical spine: The skullbase is normal. The craniocervical junction is normal. The upper cervical spine is unremarkable. Midline sagittal structures are within normal limits. Sinuses/Orbits: Mild mucosal thickening is present in the maxillary sinuses and scattered throughout the ethmoid air cells. No fluid levels are present. Mastoid air cells are clear. The globes and orbits are unremarkable. MRA HEAD FINDINGS The internal carotid artery is are within normal limits from the high cervical segments through the ICA termini bilaterally. There is some attenuation of distal A1 segments and more prominently the distal left M1 segment. The MCA bifurcations are intact. Segmental narrowing is noted in the MCA branch vessels bilaterally, left greater than right. There is some attenuation of distal ACA branch vessels as well. The right vertebral artery is occluded with minimal flow in the distal segment. The left vertebral artery is intact. Basilar artery is patent. The right posterior cerebral artery is of fetal type. There is attenuation of distal PCA branch vessels bilaterally. IMPRESSION: 1. 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 2. Scattered subcortical T2 hyperintensities bilaterally are mildly advanced for age. These are nonspecific. Given the other ischemic disease, this likely reflects remote ischemia as well. 3. 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 4. Asymmetric atherosclerotic disease involving distal left M1 segment and  left greater than right MCA branch vessels. These results were called by telephone at the time of interpretation on 11/03/2016 at 12:14 pm to Dr. Georgena Spurling, who verbally acknowledged these results. Electronically Signed   By: San Morelle M.D.   On: 11/03/2016 12:28   Mr [name] Head/brain IH Cm  Result Date: 11/03/2016 CLINICAL DATA:  Acute onset right-sided numbness. Symptoms began at 2:30 p.m. yesterday. EXAM: MRI HEAD WITHOUT CONTRAST MRA HEAD WITHOUT CONTRAST TECHNIQUE: Multiplanar, multiecho pulse sequences of the brain and surrounding structures were obtained without intravenous contrast. Angiographic images of the head were obtained using MRA technique without contrast. COMPARISON:  CT head without contrast 11/02/2016. CTA head and neck 11/02/2016. FINDINGS: MRI HEAD FINDINGS Brain: The diffusion-weighted images demonstrate an acute/ subacute nonhemorrhagic infarct involving the posterior limb of the left internal capsule. T2 signal changes are associated. Other scattered subcortical T2 hyperintensities are present bilaterally. No focal hemorrhage or mass lesion is present. The ventricles are of normal size. No significant extra-axial fluid collection is present. Internal auditory canals are within normal limits bilaterally. The brainstem and cerebellum are normal. Vascular: Abnormal signal is present in the right vertebral  artery, compatible with occluded flow. Skull and upper cervical spine: The skullbase is normal. The craniocervical junction is normal. The upper cervical spine is unremarkable. Midline sagittal structures are within normal limits. Sinuses/Orbits: Mild mucosal thickening is present in the maxillary sinuses and scattered throughout the ethmoid air cells. No fluid levels are present. Mastoid air cells are clear. The globes and orbits are unremarkable. MRA HEAD FINDINGS The internal carotid artery is are within normal limits from the high cervical segments through the ICA termini  bilaterally. There is some attenuation of distal A1 segments and more prominently the distal left M1 segment. The MCA bifurcations are intact. Segmental narrowing is noted in the MCA branch vessels bilaterally, left greater than right. There is some attenuation of distal ACA branch vessels as well. The right vertebral artery is occluded with minimal flow in the distal segment. The left vertebral artery is intact. Basilar artery is patent. The right posterior cerebral artery is of fetal type. There is attenuation of distal PCA branch vessels bilaterally. IMPRESSION: 1. 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 2. Scattered subcortical T2 hyperintensities bilaterally are mildly advanced for age. These are nonspecific. Given the other ischemic disease, this likely reflects remote ischemia as well. 3. 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 4. Asymmetric atherosclerotic disease involving distal left M1 segment and left greater than right MCA branch vessels. These results were called by telephone at the time of interpretation on 11/03/2016 at 12:14 pm to Dr. Georgena Spurling, who verbally acknowledged these results. Electronically Signed   By: San Morelle M.D.   On: 11/03/2016 12:28       Medical Problem List and Plan: 1.  Right side weakness secondary to left internal capsule infarct  -admit to inpatient rehab 2.  DVT Prophylaxis/Anticoagulation: Subcutaneous Lovenox. Monitor platelet counts and any signs of bleeding 3. Pain Management: Robaxin as needed 4. Mood: Provide emotional support 5. Neuropsych: This patient is capable of making decisions on her own behalf. 6. Skin/Wound Care: Routine skin checks 7. Fluids/Electrolytes/Nutrition: Routine I&O with follow up chemestries 8. Hyperlipidemia. Lipitor 9. Polysubstance abuse. Counseling    Post Admission Physician Evaluation: 1. Functional deficits  secondary  to left internal capsule infarct. 2. Patient is admitted to receive collaborative, interdisciplinary care between the physiatrist, rehab nursing staff, and therapy team. 3. Patient's level of medical complexity and substantial therapy needs in context of that medical necessity cannot be provided at a lesser intensity of care such as a SNF. 4. Patient has experienced substantial functional loss from his/her baseline which was documented above under the "Functional History" and "Functional Status" headings.  Judging by the patient's diagnosis, physical exam, and functional history, the patient has potential for functional progress which will result in measurable gains while on inpatient rehab.  These gains will be of substantial and practical use upon discharge  in facilitating mobility and self-care at the household level. 5. Physiatrist will provide 24 hour management of medical needs as well as oversight of the therapy plan/treatment and provide guidance as appropriate regarding the interaction of the two. 6. The Preadmission Screening has been reviewed and patient status is unchanged unless otherwise stated above. 7. 24 hour rehab nursing will assist with bladder management, bowel management, safety, skin/wound care, disease management, medication administration, pain management and patient education  and help integrate therapy concepts, techniques,education, etc. 8. PT will assess and treat for/with: Lower extremity strength, range of motion, stamina, balance, functional  mobility, safety, adaptive techniques and equipment, NMR stroke education, ego support, community reintegration.   Goals are: mod I. 9. OT will assess and treat for/with: ADL's, functional mobility, safety, upper extremity strength, adaptive techniques and equipment, NMR, stroke education, family ed, ego support.   Goals are: mod I. Therapy may proceed with showering this patient. 10. SLP will assess and treat for/with: n/a.   Goals are: n/a. 11. Case Management and Social Worker will assess and treat for psychological issues and discharge planning. 12. Team conference will be held weekly to assess progress toward goals and to determine barriers to discharge. 13. Patient will receive at least 3 hours of therapy per day at least 5 days per week. 14. ELOS: 12-15 days       15. Prognosis:  excellent     Meredith Staggers, MD, [facility] 11/05/2016  Cathlyn Parsons., PA-C 11/05/2016

## 2016-11-05 NOTE — Discharge Summary (Signed)
Physician Discharge Summary  Philip Howard AYT:[id number] DOB: 1966/03/04 DOA: 11/02/2016  PCP: Patient, No Pcp Per  Admit date: 11/02/2016 Discharge date: 11/05/2016  Admitted From: Home Disposition: CIR  Recommendations for Outpatient Follow-up:  1. Follow up with PCP in 1 week 2. Work up cramps as an outpatient 3. Neurology follow-up in 4-6 weeks   Discharge Condition: Stable CODE STATUS: Full code Diet recommendation: Heart healthy   Brief/Interim Summary:  Admission HPI written by Philip Bulls, MD   Chief Complaint: Right arm and leg "heaviness"   HPI: Philip Howard is a 51 y.o. male who denies any significant past medical history, but also does not see a physician regularly, now presenting to the emergency department with acute onset of right-sided numbness. Patient reports that he woke in his usual state of health and was having an uneventful day when he was eating lunch at approximately 2:30 PM and noted in acute "heavy" sensation involving the right arm and right leg. He denies weakness, but describes it as more of a numbness. He denies any associated chest pain or palpitations and denies any headache, change in vision or hearing, confusion, or loss of coordination. There has not been any recent fall or trauma and the patient denies any use of illicit substances or any recent alcohol use. He has never experienced similar symptoms previously. He reports that these symptoms persisted unchanged and he came into the ED for evaluation. He was able to ambulate without difficulty.  ED Course: Upon arrival to the ED, patient is found to be afebrile, saturating well on room air, mildly hypertensive, and with vitals otherwise stable. EKG features sinus rhythm with T-wave inversions in the inferior leads and no prior EKG available. Noncontrast head CT is negative for acute intracranial abnormality. Chemistry panels notable for potassium 3.3. CBC is unremarkable, INR is within  normal limits, troponin is undetectable, ethanol level is undetectable, urinalysis is unremarkable, and UDS is positive for cocaine and THC. CTA head and neck was obtained and demonstrates occlusion of the right vertebral artery from its origin, as well as intracranial atherosclerosis resulting in mild-to-moderate stenosis of the bilateral proximal middle cerebral arteries. Neurology was consulted by the ED physician and advised against tPA, and also did not feel that any emergent intervention would be needed. Medical admission to [facility] was advised. The patient is remained hemodynamically stable in the ED and has not been in any apparent respiratory distress. He will be observed on the telemetry unit at [facility] for ongoing evaluation and management of acute right-sided numbness concerning for possible CVA.    Hospital course:  Right sided numbness Secondary to stroke. MRI significant for Acute/subacute internal capsule infarct with atherosclerotic disease of distal left M1 and L>R MCA branch vessels. CTA of head and neck with occlusion of right vertebral artery and mild to moderate stenosis of bilateral middle cerebral arteries. Patient with positive cocaine, which possibly could have contributed (patient denies use). Echocardiogram significant for grade 1 diastolic dysfunction). Lipid panel significant for an LDL of 166. Aspirin at discharge. Lipitor at discharge.  Right vertebral artery occlusion Not contributory to stroke. Outpatient follow-up.  Elevated BP Permissive hypertension allowed but patient remained relatively normotensive.  Hypokalemia Resolved. Given supplementation.  Substance abuse UDS positive for cocaine and THC. Discussed with patient that use should cease as it could be contributory. Patient adamantly denies cocaine use but states he does use marijuana on a daily basis.  Cramps Chronic and present for  over 20 years. Outpatient  follow-up.  Pre-diabetes Hemoglobin A1c of 5.7.   Discharge Diagnoses:  Principal Problem:   Numbness on right side Active Problems:   Hypokalemia   Substance abuse   Vertebral artery occlusion, right   Stroke ([place])   Acute ischemic stroke (HCC)   Smoker   Alcohol abuse   Hyperlipidemia   Weakness of extremity   Cocaine abuse   Chronic diastolic congestive heart failure [facility])    Discharge Instructions  Discharge Instructions    Ambulatory referral to Neurology    Complete by:  As directed    Follow up with stroke clinic Philip Howard preferred, if not available, then consider Philip Howard, [facility] or Philip Howard whoever is available) at [facility] in about 6-8 weeks. Thanks.   Diet - low sodium heart healthy    Complete by:  As directed    Increase activity slowly    Complete by:  As directed      Allergies as of 11/05/2016   No Known Allergies     Medication List    TAKE these medications   aspirin 325 MG tablet Take 1 tablet (325 mg total) by mouth daily. Start taking on:  11/06/2016   atorvastatin 40 MG tablet Commonly known as:  LIPITOR Take 1 tablet (40 mg total) by mouth daily at 6 PM.   methocarbamol 500 MG tablet Commonly known as:  ROBAXIN Take 1 tablet (500 mg total) by mouth every 8 (eight) hours as needed for muscle spasms.      Follow-up Information    Philip Bible, NP. Schedule an appointment as soon as possible for a visit in 6 week(s).   Specialty:  Family Medicine Why:  Stroke follow up Contact information: [address] [place] [address] [phone]          No Known Allergies  Consultations:  Neurology   Procedures/Studies: Ct Angio Head W Or Wo Contrast  Result Date: 11/02/2016 CLINICAL DATA:  RIGHT-sided weakness at lunch today. Assess for stroke. EXAM: CT ANGIOGRAPHY HEAD AND NECK TECHNIQUE: Multidetector CT imaging of the head and neck was performed using the standard protocol during bolus  administration of intravenous contrast. Multiplanar CT image reconstructions and MIPs were obtained to evaluate the vascular anatomy. Carotid stenosis measurements (when applicable) are obtained utilizing NASCET criteria, using the distal internal carotid diameter as the denominator. CONTRAST:  80 cc Isovue 370 COMPARISON:  CT HEAD Nov 02, 2016 at 1511 hours FINDINGS: CTA NECK AORTIC ARCH: Normal appearance of the thoracic arch, 2 vessel arch is a normal variant. Trace calcific atherosclerosis of aortic arch. The origins of the innominate, left Common carotid artery and subclavian artery are widely patent. RIGHT CAROTID SYSTEM: Common carotid artery is widely patent. Normal appearance of the carotid bifurcation without hemodynamically significant stenosis by NASCET criteria. Normal appearance of the included internal carotid artery. LEFT CAROTID SYSTEM: Common carotid artery is widely patent. Normal appearance of the carotid bifurcation without hemodynamically significant stenosis by NASCET criteria. Normal appearance of the included internal carotid artery. VERTEBRAL ARTERIES:RIGHT vertebral artery is occluded from the origin distally without reconstitution in the neck. LEFT vertebral artery is widely patent normal in appearance. SKELETON: No acute osseous process though bone windows have not been submitted. Patient is edentulous. Calcified bilateral stylohyoid ligaments. OTHER NECK: Soft tissues of the neck are non-acute though, not tailored for evaluation. Subcentimeter nonspecific mediastinal lymph nodes may be reactive. CTA HEAD ANTERIOR CIRCULATION: Patent cervical internal carotid arteries, petrous, cavernous and  supra clinoid internal carotid arteries. Widely patent anterior communicating artery. Patent anterior and middle cerebral arteries, mild to moderate tandem stenoses regularity proximal bilateral middle cerebral artery is. No large vessel occlusion, hemodynamically significant stenosis, dissection,  contrast extravasation or aneurysm. POSTERIOR CIRCULATION: Retrograde flow of distal RIGHT V4 segment. Mild luminal irregularity basilar artery and posterior cerebral arteries. LEFT V4 segment is widely patent. Normal appearance of the basilar artery main branch vessels. Predominately fetal origin of RIGHT posterior cerebral artery. No hemodynamically significant stenosis, dissection, contrast extravasation or aneurysm. VENOUS SINUSES: Major dural venous sinuses are patent though not tailored for evaluation on this angiographic examination. ANATOMIC VARIANTS: None. DELAYED PHASE: No abnormal intracranial enhancement. MIP images reviewed. IMPRESSION: CTA NECK: Occluded RIGHT vertebral artery without reconstitution in the neck, likely acute. Otherwise negative CTA NECK. CTA HEAD: No emergent large vessel occlusion or severe stenosis. Retrograde flow to distal RIGHT V4 segment. Intracranial atherosclerosis resulting in mild-to-moderate stenoses of the bilateral proximal middle cerebral arteries. Critical Value/emergent results were called by telephone at the time of interpretation on 11/02/2016 at 5:11 pm to Dr. Nanda Quinton , who verbally acknowledged these results. Electronically Signed   By: Elon Alas M.D.   On: 11/02/2016 17:14   Ct Head W Contrast  Result Date: 11/04/2016 CLINICAL DATA:  Right-sided weakness EXAM: CT HEAD WITHOUT CONTRAST TECHNIQUE: Contiguous axial images were obtained from the base of the skull through the vertex without intravenous contrast. COMPARISON:  Two days ago. FINDINGS: Brain: Acute infarct in the left basal ganglia and corona radiata shows no evidence of progression or hemorrhage. No evidence of new infarct. No hydrocephalus or mass effect. Vascular: No hyperdense vessel. Skull: No acute or aggressive finding. Sinuses/Orbits: Negative IMPRESSION: Known acute infarct in the left basal ganglia and corona radiata. No evidence of progression. No hemorrhagic conversion.  Electronically Signed   By: Monte Fantasia M.D.   On: 11/04/2016 08:20   Ct Angio Neck W And/or Wo Contrast  Result Date: 11/02/2016 CLINICAL DATA:  RIGHT-sided weakness at lunch today. Assess for stroke. EXAM: CT ANGIOGRAPHY HEAD AND NECK TECHNIQUE: Multidetector CT imaging of the head and neck was performed using the standard protocol during bolus administration of intravenous contrast. Multiplanar CT image reconstructions and MIPs were obtained to evaluate the vascular anatomy. Carotid stenosis measurements (when applicable) are obtained utilizing NASCET criteria, using the distal internal carotid diameter as the denominator. CONTRAST:  80 cc Isovue 370 COMPARISON:  CT HEAD Nov 02, 2016 at 1511 hours FINDINGS: CTA NECK AORTIC ARCH: Normal appearance of the thoracic arch, 2 vessel arch is a normal variant. Trace calcific atherosclerosis of aortic arch. The origins of the innominate, left Common carotid artery and subclavian artery are widely patent. RIGHT CAROTID SYSTEM: Common carotid artery is widely patent. Normal appearance of the carotid bifurcation without hemodynamically significant stenosis by NASCET criteria. Normal appearance of the included internal carotid artery. LEFT CAROTID SYSTEM: Common carotid artery is widely patent. Normal appearance of the carotid bifurcation without hemodynamically significant stenosis by NASCET criteria. Normal appearance of the included internal carotid artery. VERTEBRAL ARTERIES:RIGHT vertebral artery is occluded from the origin distally without reconstitution in the neck. LEFT vertebral artery is widely patent normal in appearance. SKELETON: No acute osseous process though bone windows have not been submitted. Patient is edentulous. Calcified bilateral stylohyoid ligaments. OTHER NECK: Soft tissues of the neck are non-acute though, not tailored for evaluation. Subcentimeter nonspecific mediastinal lymph nodes may be reactive. CTA HEAD ANTERIOR CIRCULATION: Patent  cervical internal carotid arteries,  petrous, cavernous and supra clinoid internal carotid arteries. Widely patent anterior communicating artery. Patent anterior and middle cerebral arteries, mild to moderate tandem stenoses regularity proximal bilateral middle cerebral artery is. No large vessel occlusion, hemodynamically significant stenosis, dissection, contrast extravasation or aneurysm. POSTERIOR CIRCULATION: Retrograde flow of distal RIGHT V4 segment. Mild luminal irregularity basilar artery and posterior cerebral arteries. LEFT V4 segment is widely patent. Normal appearance of the basilar artery main branch vessels. Predominately fetal origin of RIGHT posterior cerebral artery. No hemodynamically significant stenosis, dissection, contrast extravasation or aneurysm. VENOUS SINUSES: Major dural venous sinuses are patent though not tailored for evaluation on this angiographic examination. ANATOMIC VARIANTS: None. DELAYED PHASE: No abnormal intracranial enhancement. MIP images reviewed. IMPRESSION: CTA NECK: Occluded RIGHT vertebral artery without reconstitution in the neck, likely acute. Otherwise negative CTA NECK. CTA HEAD: No emergent large vessel occlusion or severe stenosis. Retrograde flow to distal RIGHT V4 segment. Intracranial atherosclerosis resulting in mild-to-moderate stenoses of the bilateral proximal middle cerebral arteries. Critical Value/emergent results were called by telephone at the time of interpretation on 11/02/2016 at 5:11 pm to Dr. Nanda Quinton , who verbally acknowledged these results. Electronically Signed   By: Elon Alas M.D.   On: 11/02/2016 17:14   Mr Brain Wo Contrast  Result Date: 11/03/2016 CLINICAL DATA:  Acute onset right-sided numbness. Symptoms began at 2:30 p.m. yesterday. EXAM: MRI HEAD WITHOUT CONTRAST MRA HEAD WITHOUT CONTRAST TECHNIQUE: Multiplanar, multiecho pulse sequences of the brain and surrounding structures were obtained without intravenous contrast.  Angiographic images of the head were obtained using MRA technique without contrast. COMPARISON:  CT head without contrast 11/02/2016. CTA head and neck 11/02/2016. FINDINGS: MRI HEAD FINDINGS Brain: The diffusion-weighted images demonstrate an acute/ subacute nonhemorrhagic infarct involving the posterior limb of the left internal capsule. T2 signal changes are associated. Other scattered subcortical T2 hyperintensities are present bilaterally. No focal hemorrhage or mass lesion is present. The ventricles are of normal size. No significant extra-axial fluid collection is present. Internal auditory canals are within normal limits bilaterally. The brainstem and cerebellum are normal. Vascular: Abnormal signal is present in the right vertebral artery, compatible with occluded flow. Skull and upper cervical spine: The skullbase is normal. The craniocervical junction is normal. The upper cervical spine is unremarkable. Midline sagittal structures are within normal limits. Sinuses/Orbits: Mild mucosal thickening is present in the maxillary sinuses and scattered throughout the ethmoid air cells. No fluid levels are present. Mastoid air cells are clear. The globes and orbits are unremarkable. MRA HEAD FINDINGS The internal carotid artery is are within normal limits from the high cervical segments through the ICA termini bilaterally. There is some attenuation of distal A1 segments and more prominently the distal left M1 segment. The MCA bifurcations are intact. Segmental narrowing is noted in the MCA branch vessels bilaterally, left greater than right. There is some attenuation of distal ACA branch vessels as well. The right vertebral artery is occluded with minimal flow in the distal segment. The left vertebral artery is intact. Basilar artery is patent. The right posterior cerebral artery is of fetal type. There is attenuation of distal PCA branch vessels bilaterally. IMPRESSION: 1. Acute/subacute nonhemorrhagic infarct  involving the posterior limb of the left internal capsule. 2. Scattered subcortical T2 hyperintensities bilaterally are mildly advanced for age. These are nonspecific. Given the other ischemic disease, this likely reflects remote ischemia as well. 3. 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 4. Asymmetric atherosclerotic disease involving distal left M1  segment and left greater than right MCA branch vessels. These results were called by telephone at the time of interpretation on 11/03/2016 at 12:14 pm to Dr. Georgena Spurling, who verbally acknowledged these results. Electronically Signed   By: San Morelle M.D.   On: 11/03/2016 12:28   Mr [name] Head/brain OI Cm  Result Date: 11/03/2016 CLINICAL DATA:  Acute onset right-sided numbness. Symptoms began at 2:30 p.m. yesterday. EXAM: MRI HEAD WITHOUT CONTRAST MRA HEAD WITHOUT CONTRAST TECHNIQUE: Multiplanar, multiecho pulse sequences of the brain and surrounding structures were obtained without intravenous contrast. Angiographic images of the head were obtained using MRA technique without contrast. COMPARISON:  CT head without contrast 11/02/2016. CTA head and neck 11/02/2016. FINDINGS: MRI HEAD FINDINGS Brain: The diffusion-weighted images demonstrate an acute/ subacute nonhemorrhagic infarct involving the posterior limb of the left internal capsule. T2 signal changes are associated. Other scattered subcortical T2 hyperintensities are present bilaterally. No focal hemorrhage or mass lesion is present. The ventricles are of normal size. No significant extra-axial fluid collection is present. Internal auditory canals are within normal limits bilaterally. The brainstem and cerebellum are normal. Vascular: Abnormal signal is present in the right vertebral artery, compatible with occluded flow. Skull and upper cervical spine: The skullbase is normal. The craniocervical junction is normal. The upper cervical spine is unremarkable.  Midline sagittal structures are within normal limits. Sinuses/Orbits: Mild mucosal thickening is present in the maxillary sinuses and scattered throughout the ethmoid air cells. No fluid levels are present. Mastoid air cells are clear. The globes and orbits are unremarkable. MRA HEAD FINDINGS The internal carotid artery is are within normal limits from the high cervical segments through the ICA termini bilaterally. There is some attenuation of distal A1 segments and more prominently the distal left M1 segment. The MCA bifurcations are intact. Segmental narrowing is noted in the MCA branch vessels bilaterally, left greater than right. There is some attenuation of distal ACA branch vessels as well. The right vertebral artery is occluded with minimal flow in the distal segment. The left vertebral artery is intact. Basilar artery is patent. The right posterior cerebral artery is of fetal type. There is attenuation of distal PCA branch vessels bilaterally. IMPRESSION: 1. 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 2. Scattered subcortical T2 hyperintensities bilaterally are mildly advanced for age. These are nonspecific. Given the other ischemic disease, this likely reflects remote ischemia as well. 3. 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 4. Asymmetric atherosclerotic disease involving distal left M1 segment and left greater than right MCA branch vessels. These results were called by telephone at the time of interpretation on 11/03/2016 at 12:14 pm to Dr. Georgena Spurling, who verbally acknowledged these results. Electronically Signed   By: San Morelle M.D.   On: 11/03/2016 12:28   Ct Head Code Stroke Wo Contrast`  Result Date: 11/02/2016 CLINICAL DATA:  Code stroke. Sudden onset of right-sided weakness while eating lunch today. EXAM: CT HEAD WITHOUT CONTRAST TECHNIQUE: Contiguous axial images were obtained from the  base of the skull through the vertex without intravenous contrast. COMPARISON:  None. FINDINGS: Brain: No acute infarct, hemorrhage, or mass lesion is present. The ventricles are of normal size. No significant extraaxial fluid collection is present. Vascular: No hyperdense vessel or unexpected calcification. Skull: Normal. Negative for fracture or focal lesion. Sinuses/Orbits: The paranasal sinuses and mastoid air cells are clear. The globes and orbits are within normal limits. ASPECTS [facility] Stroke Program Early  CT Score) - Ganglionic level infarction (caudate, lentiform nuclei, internal capsule, insula, M1-M3 cortex): 7/7 - Supraganglionic infarction (M4-M6 cortex): 3/3 Total score (0-10 with 10 being normal): 10/10 IMPRESSION: 1. Negative CT of the head 2. ASPECTS is 10/10 These results were called by telephone at the time of interpretation on 11/02/2016 at 3:20 pm to Dr. Nanda Quinton , who verbally acknowledged these results. Electronically Signed   By: San Morelle M.D.   On: 11/02/2016 15:21    Echocardiogram (11/03/2016)  Study Conclusions  - Left ventricle: The cavity size was normal. Wall thickness was   normal. Systolic function was normal. The estimated ejection   fraction was in the range of 55% to 60%. Wall motion was normal;   there were no regional wall motion abnormalities. Doppler   parameters are consistent with abnormal left ventricular   relaxation (grade 1 diastolic dysfunction). - Right atrium: The atrium was mildly dilated.  Impressions:  - Normal LV systolic function; mild diastolic dysfunction; mild   RAE/RVE.   Subjective: Patient reports some cramping and numbness of right hand.  Discharge Exam: Vitals:   11/05/16 0905 11/05/16 1304  BP: (!) 160/81 120/62  Pulse: 92 75  Resp: 18 20  Temp: 97.7 F (36.5 C) 98.9 F (37.2 C)   Vitals:   11/05/16 0537 11/05/16 0857 11/05/16 0905 11/05/16 1304  BP: (!) 152/99  (!) 160/81 120/62  Pulse: 74  92 75   Resp: 20  18 20   Temp: 97.6 F (36.4 C)  97.7 F (36.5 C) 98.9 F (37.2 C)  TempSrc: Oral Other (Comment) Oral Oral  SpO2: 99%  100% 99%  Height:        General: Pt is alert, awake, not in acute distress Cardiovascular: RRR, S1/S2 +, no rubs, no gallops Respiratory: CTA bilaterally, no wheezing, no rhonchi Abdominal: Soft, NT, ND, bowel sounds + Extremities: no edema, no cyanosis Neuro: 3/5 right grip strength with numbness    The results of significant diagnostics from this hospitalization (including imaging, microbiology, ancillary and laboratory) are listed below for reference.     Labs: Basic Metabolic Panel:  Recent Labs Lab 11/02/16 1518 11/02/16 1534 11/04/16 0349 11/05/16 1008  NA 137 139 135 135  K 3.3* 3.3* 3.5 3.8  CL 107 106 104 104  CO2 22  --  21* 20*  GLUCOSE 107* 106* 122* 118*  BUN 24* 25* 11 15  CREATININE 1.18 1.10 1.01 1.15  CALCIUM 9.2  --  9.3 10.0  MG  --   --   --  2.2   Liver Function Tests:  Recent Labs Lab 11/02/16 1518  AST 27  ALT 38  ALKPHOS 54  BILITOT 0.4  PROT 6.6  ALBUMIN 3.9   CBC:  Recent Labs Lab 11/02/16 1510 11/02/16 1534  WBC 7.0  --   NEUTROABS 3.7  --   HGB 13.8 15.0  HCT 41.1 44.0  MCV 91.9  --   PLT 252  --    CBG:  Recent Labs Lab 11/02/16 1520  GLUCAP 111*   Hgb A1c  Recent Labs  11/03/16 1224  HGBA1C 5.7*   Lipid Profile  Recent Labs  11/04/16 0349  CHOL 283*  HDL 39*  LDLCALC 166*  TRIG 388*  CHOLHDL 7.3   Urinalysis    Component Value Date/Time   COLORURINE YELLOW 11/02/2016 1611   APPEARANCEUR CLEAR 11/02/2016 1611   LABSPEC 1.017 11/02/2016 1611   PHURINE 5.0 11/02/2016 1611   GLUCOSEU NEGATIVE 11/02/2016 1611  HGBUR NEGATIVE 11/02/2016 1611   [place] 11/02/2016 [place] 11/02/2016 1611   PROTEINUR NEGATIVE 11/02/2016 1611   NITRITE NEGATIVE 11/02/2016 1611   LEUKOCYTESUR NEGATIVE 11/02/2016 1611    Time coordinating discharge:  Over 30 minutes  SIGNED:   Cordelia Poche, MD [facility] 11/05/2016, 2:58 PM Pager [phone]  If 7PM-7AM, please contact night-coverage [email or web address] Password [email or web address]

## 2016-11-05 NOTE — Progress Notes (Signed)
I have a bed for Mr. Arpin and Dr. Lonny Prude has given medical clearance.  I will arrange for admission later today.  Please call if questions.  [place] Admissions Coordinator Cell [phone] Office [phone]

## 2016-11-05 NOTE — Progress Notes (Signed)
Received pt. As a transfer from [facility].Pt. And his family were oriented to the unit routine and protocol.Safety plan was explained,fall prevention plan was explained and signed by the RN and the Pt.

## 2016-11-06 ENCOUNTER — Inpatient Hospital Stay (HOSPITAL_COMMUNITY): Payer: Self-pay | Admitting: Physical Therapy

## 2016-11-06 ENCOUNTER — Inpatient Hospital Stay (HOSPITAL_COMMUNITY): Payer: Self-pay | Admitting: Occupational Therapy

## 2016-11-06 DIAGNOSIS — I63312 Cerebral infarction due to thrombosis of left middle cerebral artery: Secondary | ICD-10-CM

## 2016-11-06 DIAGNOSIS — G8191 Hemiplegia, unspecified affecting right dominant side: Secondary | ICD-10-CM

## 2016-11-06 LAB — CBC WITH DIFFERENTIAL/PLATELET
BASOS ABS: 0 10*3/uL (ref 0.0–0.1)
BASOS PCT: 0 %
Eosinophils Absolute: 0.1 10*3/uL (ref 0.0–0.7)
Eosinophils Relative: 1 %
HEMATOCRIT: 45 % (ref 39.0–52.0)
HEMOGLOBIN: 14.9 g/dL (ref 13.0–17.0)
Lymphocytes Relative: 33 %
Lymphs Abs: 2.2 10*3/uL (ref 0.7–4.0)
MCH: 30.3 pg (ref 26.0–34.0)
MCHC: 33.1 g/dL (ref 30.0–36.0)
MCV: 91.6 fL (ref 78.0–100.0)
Monocytes Absolute: 0.5 10*3/uL (ref 0.1–1.0)
Monocytes Relative: 8 %
NEUTROS ABS: 3.9 10*3/uL (ref 1.7–7.7)
NEUTROS PCT: 58 %
PLATELETS: 257 10*3/uL (ref 150–400)
RBC: 4.91 MIL/uL (ref 4.22–5.81)
RDW: 14.6 % (ref 11.5–15.5)
WBC: 6.7 10*3/uL (ref 4.0–10.5)

## 2016-11-06 LAB — COMPREHENSIVE METABOLIC PANEL
ALT: 61 U/L (ref 17–63)
ANION GAP: 11 (ref 5–15)
AST: 49 U/L — ABNORMAL HIGH (ref 15–41)
Albumin: 3.9 g/dL (ref 3.5–5.0)
Alkaline Phosphatase: 62 U/L (ref 38–126)
BILIRUBIN TOTAL: 0.8 mg/dL (ref 0.3–1.2)
BUN: 16 mg/dL (ref 6–20)
CHLORIDE: 102 mmol/L (ref 101–111)
CO2: 24 mmol/L (ref 22–32)
Calcium: 9.5 mg/dL (ref 8.9–10.3)
Creatinine, Ser: 1.18 mg/dL (ref 0.61–1.24)
GFR calc Af Amer: >60 mL/min (ref >60)
Glucose, Bld: 117 mg/dL — ABNORMAL HIGH (ref 65–99)
POTASSIUM: 3.3 mmol/L — AB (ref 3.5–5.1)
Sodium: 137 mmol/L (ref 135–145)
TOTAL PROTEIN: 6.8 g/dL (ref 6.5–8.1)

## 2016-11-06 MED ORDER — POTASSIUM CHLORIDE CRYS ER 20 MEQ PO TBCR
20.0000 meq | EXTENDED_RELEASE_TABLET | Freq: Two times a day (BID) | ORAL | Status: AC
  Administered 2016-11-06 – 2016-11-07 (×4): 20 meq via ORAL
  Filled 2016-11-06 (×4): qty 1

## 2016-11-06 MED ORDER — METHOCARBAMOL 500 MG PO TABS
500.0000 mg | ORAL_TABLET | Freq: Three times a day (TID) | ORAL | Status: DC
  Administered 2016-11-06 – 2016-11-12 (×18): 500 mg via ORAL
  Filled 2016-11-06 (×18): qty 1

## 2016-11-06 NOTE — Progress Notes (Signed)
Social Work Patient ID: Philip Howard, male   DOB: 1966-04-02, 51 y.o.   MRN: [id number]  Met with pt, sister and fiance to inform team conference goals mod/i level and target discharge date 6/5. Pt feels that is too long Made aware will have another team conference next Wed will see if progressed enough can move up discharge date. He would like a grounds pass, made aware no smoking when going outside with his family. He is  Quitting now. Work on discharge needs and follow up. Will make neuro-psych referral to see.

## 2016-11-06 NOTE — Care Management Note (Signed)
Inpatient Rehabilitation Center Individual Statement of Services  Patient Name:  Philip Howard  Date:  11/06/2016  Welcome to the [facility].  Our goal is to provide you with an individualized program based on your diagnosis and situation, designed to meet your specific needs.  With this comprehensive rehabilitation program, you will be expected to participate in at least 3 hours of rehabilitation therapies Monday-Friday, with modified therapy programming on the weekends.  Your rehabilitation program will include the following services:  Physical Therapy (PT), Occupational Therapy (OT), 24 hour per day rehabilitation nursing, Therapeutic Recreaction (TR), Neuropsychology, Case Management (Social Worker), Rehabilitation Medicine, Nutrition Services and Pharmacy Services  Weekly team conferences will be held on Wednesday to discuss your progress.  Your Social Worker will talk with you frequently to get your input and to update you on team discussions.  Team conferences with you and your family in attendance may also be held.  Expected length of stay: 10-14 days  Overall anticipated outcome: mod/i level  Depending on your progress and recovery, your program may change. Your Social Worker will coordinate services and will keep you informed of any changes. Your Social Worker's name and contact numbers are listed  below.  The following services may also be recommended but are not provided by the [facility] will be made to provide these services after discharge if needed.  Arrangements include referral to agencies that provide these services.  Your insurance has been verified to be:  Self Pay Your primary doctor is:  None  Pertinent information will be shared with your doctor and your insurance  company.  Social Worker:  Ovidio Kin, [place] or [phone]  Information discussed with and copy given to patient by: Elease Hashimoto, 11/06/2016, 11:42 AM

## 2016-11-06 NOTE — Progress Notes (Signed)
Patient information reviewed and entered into eRehab system by Donaven Criswell, RN, CRRN, PPS Coordinator.  Information including medical coding and functional independence measure will be reviewed and updated through discharge.    

## 2016-11-06 NOTE — Evaluation (Signed)
Occupational Therapy Assessment and Plan  Patient Details  Name: Philip Howard MRN: [id number] Date of Birth: 07-20-1965  OT Diagnosis: abnormal posture, ataxia, hemiplegia affecting dominant side and muscle weakness (generalized) Rehab Potential: Rehab Potential (ACUTE ONLY): Excellent ELOS: 14 days   Today's Date: 11/06/2016 OT Individual Time: 0930-1030 OT Individual Time Calculation (min): 60 min     Problem List:  Patient Active Problem List   Diagnosis Date Noted  . Thrombotic cerebral infarction ([place]) 11/05/2016  . Weakness of extremity   . Cocaine abuse   . Chronic diastolic congestive heart failure ([employer] [facility])   . Smoker   . Alcohol abuse   . Hyperlipidemia   . Acute ischemic stroke ([place]) 11/03/2016  . Numbness on right side 11/02/2016  . Hypokalemia 11/02/2016  . Substance abuse 11/02/2016  . 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 Stroke [facility]) 11/02/2016    Past Medical History: No past medical history on file. Past Surgical History:  Past Surgical History:  Procedure Laterality Date  . arthroscopic surgery      Assessment & Plan Clinical Impression:Philip D Watlingtonis a 51 y.o.right handed malewith history of tobacco, [name] Per chart review and patient, patient lives with girlfriend independent prior to admission. Presented 11/02/2016 with right-sided weakness. Urine drug screen positive for cocaine and marijuana. CT reviewed, showing left basal ganglia infarct. Per report, MRI showed acute subacute nonhemorrhagic infarct involving the posterior limb of the internal capsule. 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 CTA of the head showed no emergent large vessel occlusion or severe stenosis. Echocardiogram with ejection fraction of 62% grade 1 diastolic dysfunction. Patient did not receive TPA. Repeat cranial CT scan 11/04/2016 secondary to complaints of  increased numbness to the right side showed no evidence of progression no hemorrhagic conversion. Neurology consulted presently on aspirin for CVA prophylaxis. Subcutaneous Lovenox for DVT prophylaxis. Physical and occupational therapy evaluations completed with recommendations of physical medicine rehabilitation consult.Patient was admitted for a comprehensive rehab program Patient transferred to CIR on 11/05/2016 .    Patient currently requires min with basic self-care skills secondary to muscle weakness, decreased cardiorespiratoy endurance, unbalanced muscle activation, ataxia, decreased coordination and decreased motor planning and decreased sitting balance, decreased standing balance, decreased postural control, hemiplegia and decreased balance strategies.  Prior to hospitalization, patient could complete ADLs/ IADLs and be caregiver for physically disabled son with independent .  Patient will benefit from skilled intervention to decrease level of assist with basic self-care skills, increase independence with basic self-care skills and increase level of independence with iADL prior to discharge home with care partner.  Anticipate patient will require intermittent supervision and follow up outpatient.  OT - End of Session Activity Tolerance: Tolerates 10 - 20 min activity with multiple rests Endurance Deficit: Yes OT Assessment Rehab Potential (ACUTE ONLY): Excellent OT Patient demonstrates impairments in the following area(s): Balance;Endurance;Safety;Perception;Pain;Motor;Sensory OT Basic ADL's Functional Problem(s): Grooming;Bathing;Dressing;Toileting OT Advanced ADL's Functional Problem(s): Simple Meal Preparation;Laundry;Light Housekeeping OT Transfers Functional Problem(s): Toilet;Tub/Shower OT Additional Impairment(s): Fuctional Use of Upper Extremity OT Plan OT Intensity: Minimum of 1-2 x/day, 45 to 90 minutes OT Frequency: 5 out of 7 days OT Duration/Estimated Length of Stay: 14  days OT Treatment/Interventions: Balance/vestibular training;Discharge planning;Community reintegration;DME/adaptive equipment instruction;Functional mobility training;Neuromuscular re-education;Pain management;Patient/family education;Psychosocial support;Self Care/advanced ADL retraining;Therapeutic Activities;Therapeutic Exercise;UE/LE Strength taining/ROM;UE/LE Coordination activities OT Self Feeding Anticipated Outcome(s): Mod I OT Basic Self-Care Anticipated Outcome(s): Mod I OT Toileting Anticipated Outcome(s): Mod  I OT Bathroom Transfers Anticipated Outcome(s): Supervision- mod I OT Recommendation Recommendations for Other Services: Neuropsych consult;Therapeutic Recreation consult Therapeutic Recreation Interventions: Clinical cytogeneticist;Outing/community reintergration Patient destination: Home Follow Up Recommendations: Outpatient OT Equipment Recommended: To be determined   Skilled Therapeutic Intervention Pt seen for OT Eval and ADL bathing/dressing session. Pt sitting up in w/c upon arrival, fiancee present, pt agreeable to tx session. He voiced complaints throughout session of cramps in B sides. RN made aware.  He ambulated throughout session with hand held assist. He bathed seated on tub bench, required steadying assist for dynamic sitting balance, min cuing for initiation of R UE into functional task. He stood with heavy reliance on grab bars and steadying assist to complete pericare/ buttock hygiene. He dressed seated on toilet, educated regarding hemi dressing techniques. Grooming completed seated at sink with assist for set-up and VCs for problem solving. Provided pt with fine motor activity to work on outside of tx session. Education and demonstration provided regarding reducing compensatory strategies with shoulder hiking. Pt left seated in w/c at end of session, educated regarding need for assist with all mobility and use of call bell.  Educated throughout session  regarding role of OT, POC, continuum of care, CVA, sensory and motor deficits, and d/c planning.   OT Evaluation Precautions/Restrictions  Precautions Precautions: Fall Restrictions Weight Bearing Restrictions: No General Chart Reviewed: Yes Home Living/Prior Functioning Home Living Family/patient expects to be discharged to:: Private residence Living Arrangements: Spouse/significant other Available Help at Discharge: Family, Available 24 hours/day Type of Home: Mobile home (Double wide) Home Access: Ramped entrance Entrance Stairs-Rails: Right, Left Home Layout: One level Bathroom Shower/Tub: Multimedia programmer: Standard Bathroom Accessibility: Yes Additional Comments: son requires physical assist and he is the one usually providing it  Lives With: Family IADL History Homemaking Responsibilities: Yes Current License: Yes 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, bus driver Prior Function Level of Independence: Independent with basic ADLs  Able to Take Stairs?: Yes Vocation: Full time employment Comments: working full time, 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 [employer] Baseline Vision/History: Wears glasses Wears Glasses: Reading only Patient Visual Report: No change from baseline Vision Assessment?: No apparent visual deficits Eye Alignment: Within Functional Limits Ocular Range of Motion: Within Functional Limits Alignment/Gaze Preference: Within Defined Limits Praxis Praxis: Intact Cognition Overall Cognitive Status: Within Functional Limits for tasks assessed Arousal/Alertness: Awake/alert Orientation Level: Person;Place;Situation Person: Oriented Place: Oriented Situation: Oriented Year: 2018 Month: May Day of Week: Correct Memory: Appears intact Immediate Memory Recall: Sock;Blue;Bed Memory Recall: Sock;Blue;Bed Memory Recall Sock: Without Cue Memory Recall Blue: Without Cue Memory Recall Bed: Without  Cue Attention: Selective Sustained Attention: Appears intact Selective Attention: Impaired Selective Attention Impairment: Functional complex Awareness: Appears intact Problem Solving: Appears intact Behaviors: Impulsive Safety/Judgment: Appears intact Comments: Decreased awareness of deficits and poor safety awareness Sensation Sensation Light Touch: Appears Intact Stereognosis: Impaired Detail Stereognosis Impaired Details: Impaired RUE;Impaired LUE Proprioception: Appears Intact Coordination Gross Motor Movements are Fluid and Coordinated: No Fine Motor Movements are Fluid and Coordinated: No Coordination and Movement Description: Ataxic RLE < RUE Finger Nose Finger Test: Decreased speed and accuracy R UE; WFL L UE Motor  Motor Motor: Hemiplegia;Ataxia Motor - Skilled Clinical Observations: R sided Hemiplegia and mild coordination deficits.   Trunk/Postural Assessment  Cervical Assessment Cervical Assessment: Within Functional Limits Thoracic Assessment Thoracic Assessment: Within Functional Limits Lumbar Assessment Lumbar Assessment: Exceptions to [facility] (Posterior pelvic tilt) Postural Control Postural Control: Deficits on  evaluation  Balance Balance Balance Assessed: Yes Static Sitting Balance Static Sitting - Balance Support: Feet supported Static Sitting - Level of Assistance: 6: Modified independent (Device/Increase time);5: Stand by assistance Dynamic Sitting Balance Dynamic Sitting - Balance Support: During functional activity Dynamic Sitting - Level of Assistance: 4: Min assist Sitting balance - Comments: Sitting on toilet to dress and bathing Static Standing Balance Static Standing - Balance Support: During functional activity;Right upper extremity supported;Left upper extremity supported Static Standing - Level of Assistance: 4: Min assist Dynamic Standing Balance Dynamic Standing - Balance Support: During functional activity Dynamic Standing - Level of  Assistance: 4: Min assist;3: Mod assist Dynamic Standing - Comments: Standing to complete toileting and LB dressing tasks Extremity/Trunk Assessment RUE Assessment RUE Assessment: Exceptions to [facility] (4/5 throughout; decreased gross grasp) LUE Assessment LUE Assessment: Within Functional Limits   See Function Navigator for Current Functional Status.   Refer to Care Plan for Long Term Goals  Recommendations for other services: Neuropsych and Therapeutic Recreation  Pet therapy, Kitchen group and Outing/community reintegration   Discharge Criteria: Patient will be discharged from OT if patient refuses treatment 3 consecutive times without medical reason, if treatment goals not met, if there is a change in medical status, if patient makes no progress towards goals or if patient is discharged from hospital.  The above assessment, treatment plan, treatment alternatives and goals were discussed and mutually agreed upon: by patient  Ernestina Patches 11/06/2016, 12:59 PM

## 2016-11-06 NOTE — Patient Care Conference (Signed)
Inpatient RehabilitationTeam Conference and Plan of Care Update Date: 11/06/2016   Time: 10:55 AM    Patient Name: Philip Howard      Medical Record Number: [id number]  Date of Birth: 05/11/1966 Sex: Male         Room/Bed: 4W25C/4W25C-01 Payor Info: Payor: MEDICAID POTENTIAL / Plan: MEDICAID POTENTIAL / Product Type: *No Product type* /    Admitting Diagnosis: L CVA  Admit Date/Time:  11/05/2016  4:40 PM Admission Comments: No comment available   Primary Diagnosis:  Thrombotic cerebral infarction [facility]) Principal Problem: Thrombotic cerebral infarction [facility])  Patient Active Problem List   Diagnosis Date Noted  . Thrombotic cerebral infarction ([place]) 11/05/2016  . Weakness of extremity   . Cocaine abuse   . Chronic diastolic congestive heart failure ([place])   . Smoker   . Alcohol abuse   . Hyperlipidemia   . Acute ischemic stroke ([place]) 11/03/2016  . Numbness on right side 11/02/2016  . Hypokalemia 11/02/2016  . Substance abuse 11/02/2016  . 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 Stroke [facility]) 11/02/2016    Expected Discharge Date: Expected Discharge Date: 11/19/16  Team Members Present: Physician leading conference: Dr. Alysia Penna Social Worker Present: Ovidio Kin, LCSW Nurse Present: Rayetta Humphrey, RN PT Present: Barrie Folk, PT;Rosita Dechalus, PTA OT Present: Napoleon Form, OT SLP Present: Gunnar Fusi, SLP PPS Coordinator present : Daiva Nakayama, RN, CRRN     Current Status/Progress Goal Weekly Team Focus  Medical   MinA transfers, muscle spasms, ataxia on Right side  manage spasms and anxiety  minimize compensatory movements   Bowel/Bladder   continent of bowel and bladder. sorbitol given 5/22 with good result.  remain continent of b/b  monitor for changes in bowel and bladder habits.   Swallow/Nutrition/ Hydration             ADL's   Min A overall  Mod I overall, supervision shower transfers and IADLs  ADL re-training, neuro re-ed [facility] and R UE  control), safety awareness    Mobility     eval pending        Communication             Safety/Cognition/ Behavioral Observations            Pain   Denies pain this shift 7p-7a.  Pain less than or equal to 2.  Assess for pain q shift and prn.   Skin   skin is intact, no skin breakdown.  No skin breakdown while in rehab.  Monitor skin daily and q shift.      *See Care Plan and progress notes for long and short-term goals.  Barriers to Discharge: see above    Possible Resolutions to Barriers:  cont rehab    Discharge Planning/Teaching Needs:    Home with finance who works during the day, his sister's can be there while she is working. Will have 24 hr supervision      Team Discussion:  Goals mod/i level, doing well, having shoulder pain-MD prescribed muscle relaxer and heat. Ataxic and jerky at times. Poor frustration tolerance wants to be mod/i today. Will need supervision when first goes home and to assist with his disabled son.  Revisions to Treatment Plan:  New eval target DC 6/5   Continued Need for Acute Rehabilitation Level of Care: The patient requires daily medical management by a physician with specialized training in physical medicine and rehabilitation for the following conditions: Daily direction of a multidisciplinary physical rehabilitation program  to ensure safe treatment while eliciting the highest outcome that is of practical value to the patient.: Yes Daily medical management of patient stability for increased activity during participation in an intensive rehabilitation regime.: Yes Daily analysis of laboratory values and/or radiology reports with any subsequent need for medication adjustment of medical intervention for : Neurological problems  Waynetta Metheny, Gardiner Rhyme 11/07/2016, 9:04 AM

## 2016-11-06 NOTE — Evaluation (Signed)
Physical Therapy Assessment and Plan  Patient Details  Name: Philip Howard MRN: [id number] Date of Birth: [birth year]  PT Diagnosis: Abnormality of gait, Ataxia, Ataxic gait, Coordination disorder, Hemiplegia dominant and Muscle weakness Rehab Potential: Excellent ELOS: 10-14 days    Today's Date: 11/06/2016 PT Individual Time: 0801-0900 AND [id number] PT Individual Time Calculation (min): 59 min  AND 71 min   Problem List:  Patient Active Problem List   Diagnosis Date Noted  . Thrombotic cerebral infarction (HCC) 11/05/2016  . Weakness of extremity   . Cocaine abuse   . Chronic diastolic congestive heart failure (HCC)   . Smoker   . Alcohol abuse   . Hyperlipidemia   . Acute ischemic stroke (HCC) 11/03/2016  . Numbness on right side 11/02/2016  . Hypokalemia 11/02/2016  . Substance abuse 11/02/2016  . 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 Stroke [facility]) 11/02/2016    Past Medical History: No past medical history on file. Past Surgical History:  Past Surgical History:  Procedure Laterality Date  . arthroscopic surgery      Assessment & Plan Clinical Impression: Patient is a 51 y.o.right handed malewith history of tobacco, [name] Per chart review and patient, patient lives with girlfriend independent prior to admission. Presented 11/02/2016 with right-sided weakness. Urine drug screen positive for cocaine and marijuana. CT reviewed, showing left basal ganglia infarct. Per report, MRI showed acute subacute nonhemorrhagic infarct involving the posterior limb of the internal capsule. 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 CTA of the head showed no emergent large vessel occlusion or severe stenosis. Echocardiogram with ejection fraction of 60% grade 1 diastolic dysfunction. Patient did not receive TPA. Repeat cranial CT scan 11/04/2016 secondary to complaints of increased  numbness to the right side showed no evidence of progression no hemorrhagic conversion. Neurology consulted presently on aspirin for CVA prophylaxis. Subcutaneous Lovenox for DVT prophylaxis.  Patient transferred to CIR on 11/05/2016 .   Patient currently requires min with mobility secondary to muscle weakness, impaired timing and sequencing, unbalanced muscle activation, ataxia and decreased coordination and decreased standing balance, decreased postural control, hemiplegia and decreased balance strategies.  Prior to hospitalization, patient was independent  with mobility and lived with Family in a Mobile home (double wide) home.  Home access is  Ramped entrance.  Patient will benefit from skilled PT intervention to maximize safe functional mobility, minimize fall risk and decrease caregiver burden for planned discharge home with intermittent assist.  Anticipate patient will benefit from follow up OP at discharge.  PT - End of Session Activity Tolerance: Tolerates 30+ min activity with multiple rests Endurance Deficit: Yes PT Assessment Rehab Potential (ACUTE/IP ONLY): Excellent Barriers to Discharge: Other (comment) (Caregiver for Son ) PT Patient demonstrates impairments in the following area(s): Balance;Behavior;Endurance;Motor;Perception;Safety;Sensory PT Transfers Functional Problem(s): Bed Mobility;Bed to Chair;Car;Furniture;Other (comment) PT Locomotion Functional Problem(s): Ambulation;Wheelchair Mobility;Stairs PT Plan PT Intensity: Minimum of 1-2 x/day ,45 to 90 minutes PT Frequency: 5 out of 7 days PT Duration Estimated Length of Stay: 10-14 days  PT Treatment/Interventions: Ambulation/gait training;Community reintegration;Balance/vestibular training;Cognitive remediation/compensation;Discharge planning;Disease management/prevention;DME/adaptive equipment instruction;Functional electrical stimulation;Functional mobility training;Neuromuscular re-education;Pain management;Patient/family  education;Psychosocial support;Splinting/orthotics;Stair training;Therapeutic Activities;Therapeutic Exercise;UE/LE Strength taining/ROM;UE/LE Coordination activities;Visual/perceptual remediation/compensation;Wheelchair propulsion/positioning PT Transfers Anticipated Outcome(s): Mod I with LRAD  PT Locomotion Anticipated Outcome(s): Mod I -Supervision assist with LRAD  PT Recommendation Follow Up Recommendations: Outpatient PT Patient destination: Home Equipment Recommended: To be determined;Cane;Quad cane  Skilled Therapeutic Intervention  PT  instructed patient in PT Evaluation and initiated treatment intervention; see below for results. PT educated patient in [place], rehab potential, rehab goals, and discharge recommendations. Pt demonstrates overall min assist with transfers and gait without AD.   Session 2.  Pt received supine in bed and agreeable to PT. Supine>sit transfer with supervision assist, min cues for use of bed features.   Stand pivot transfer to and from nustep to [facility] with min assist from PT as well as bed<>WC transfer with min assist from PT.   Nustep reciprocal movement training x 12 minutes level 4>5. Pt utilized R ue hand splint and required min cues for ROM and proper speed of movement  WC mobility 145f with mod assist from PT and modified Hemi technique.  PT adjusted WC to Hemi height to improved efficiency with WC propulsion. Additional WC mobility x 1335fwith supervision assist and moderate cues for technique.    Gait training for [id number] with min assist from PT and mod verbal, visual, tactile cues for posture, improved hip and knee flexion as well as improve heel contact on the RLE.   PT instructed pt in Berg balance test. See below for detailed. Patient demonstrates increased fall risk as noted by score of  42 /56 on Berg Balance Scale.  (<36= high risk for falls, close to 100%; 37-45 significant >80%; 46-51 moderate >50%; 52-55 lower >25%)  Patient returned too  room and left sitting in WC [facility] call bell in reach and all needs met.      PT Evaluation Precautions/Restrictions   General   Vital Signs Pain Pain Assessment Pain Assessment: 0-10 Pain Score: 5  Pain Type: Acute pain Pain Location: Neck Pain Orientation: Mid;Medial Pain Descriptors / Indicators: Tightness Pain Frequency: Intermittent Pain Onset: On-going Patients Stated Pain Goal: 2 Pain Intervention(s): Medication (See eMAR);Heat applied Multiple Pain Sites: No Home Living/Prior Functioning Home Living Available Help at Discharge: Family;Available 24 hours/day Type of Home: Mobile home (double wide) Home Access: Ramped entrance Entrance Stairs-Rails: Right;Left Home Layout: One level Bathroom Shower/Tub: Walk-in shower;Tub/shower unit [employer] forces training and education officeres Additional Comments: son requires physical assist and he is the one usually providing it  Lives With: Family Prior Function Level of Independence: Independent with basic ADLs  Able to Take Stairs?: Yes Vocation: Full time employment Comments: working full time, 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  Vision/Perception  Vision - Assessment Ocular Range of Motion: Within Functional Limits Alignment/Gaze Preference: Within Defined Limits Saccades: Within functional limits Convergence: Within functional limits Perception Perception: Within Functional Limits Praxis Praxis: Intact  Cognition Orientation Level: Oriented X4 Attention: Selective Selective Attention: Impaired Selective Attention Impairment: Functional complex Memory: Appears intact Awareness: Appears intact Problem Solving: Appears intact Behaviors: Impulsive Sensation Sensation Light Touch: Appears Intact Proprioception: Appears Intact Coordination Gross Motor Movements are Fluid and Coordinated: No Fine Motor Movements are Fluid and Coordinated: No Coordination and Movement  Description:  impaired RLE < RUE Finger Nose Finger Test: delayed speed, accuray and amplitude  Heel Shin Test: Decreased accuracy.  Motor  Motor Motor: Hemiplegia;Ataxia Motor - Skilled Clinical Observations: R sided Hemiplegia and mild coordination deficits.   Mobility Bed Mobility Bed Mobility: Rolling Right;Rolling Left;Sit to Supine;Supine to Sit Rolling Right: 5: Supervision Rolling Right Details: Verbal cues for precautions/safety;Verbal cues for technique Rolling Left: 5: Supervision Rolling Left Details: Verbal cues for technique;Verbal cues for precautions/safety Supine to Sit: 5: Supervision Supine to Sit Details: Verbal cues for precautions/safety;Verbal cues for technique  Supine to Sit Details (indicate cue type and reason): Cues for UE placement and use of log roll to sitting Sit to Supine: 5: Supervision Sit to Supine - Details: Verbal cues for precautions/safety;Verbal cues for technique Transfers Transfers: Yes Sit to Stand: 4: Min assist Sit to Stand Details: Verbal cues for sequencing;Verbal cues for precautions/safety;Verbal cues for technique Stand Pivot Transfers: 4: Min assist Stand Pivot Transfer Details: Verbal cues for technique;Verbal cues for precautions/safety;Verbal cues for sequencing Locomotion  Ambulation Ambulation: Yes Ambulation/Gait Assistance: 4: Min assist Ambulation Distance (Feet): 140 Feet Assistive device: None Ambulation/Gait Assistance Details: Verbal cues for precautions/safety;Verbal cues for gait pattern;Tactile cues for weight shifting;Tactile cues for posture Gait Gait: Yes Gait Pattern: Impaired Gait Pattern: Step-through pattern;Decreased step length - right;Decreased dorsiflexion - right;Decreased hip/knee flexion - right;Right foot flat;Lateral trunk lean to left Stairs / Additional Locomotion Stairs: Yes Stairs Assistance: 4: Min assist Stairs Assistance Details: Verbal cues for precautions/safety;Verbal cues for gait  pattern;Verbal cues for safe use of DME/AE;Tactile cues for placement;Verbal cues for sequencing Stair Management Technique: Two rails Number of Stairs: 12 Height of Stairs: 6 (and 3) Ramp: 4: Min assist  Trunk/Postural Assessment  Cervical Assessment Cervical Assessment: Within Functional Limits Thoracic Assessment Thoracic Assessment: Within Functional Limits Lumbar Assessment Lumbar Assessment: Exceptions to [facility] Postural Control Postural Control: Deficits on evaluation (delayed in standing )  Balance Balance Balance Assessed: Yes Standardized Balance Assessment Standardized Balance Assessment: Berg Balance Test Berg Balance Test Sit to Stand: Able to stand without using hands and stabilize independently Standing Unsupported: Able to stand safely 2 minutes Sitting with Back Unsupported but Feet Supported on Floor or Stool: Able to sit safely and securely 2 minutes Stand to Sit: Sits safely with minimal use of hands Transfers: Able to transfer safely, definite need of hands Standing Unsupported with Eyes Closed: Able to stand 10 seconds safely Standing Ubsupported with Feet Together: Able to place feet together independently and stand for 1 minute with supervision From Standing, Reach Forward with Outstretched Arm: Can reach confidently >25 cm (10") From Standing Position, Pick up Object from Floor: Able to pick up shoe, needs supervision From Standing Position, Turn to Look Behind Over each Shoulder: Turn sideways only but maintains balance Turn 360 Degrees: Needs close supervision or verbal cueing Standing Unsupported, Alternately Place Feet on Step/Stool: Able to complete >2 steps/needs minimal assist Standing Unsupported, One Foot in Front: Able to take small step independently and hold 30 seconds Standing on One Leg: Able to lift leg independently and hold 5-10 seconds (on LLE) Total Score: 42 Static Sitting Balance Static Sitting - Balance Support: Feet supported Static  Sitting - Level of Assistance: 6: Modified independent (Device/Increase time);5: Stand by assistance Dynamic Sitting Balance Dynamic Sitting - Balance Support: During functional activity Dynamic Sitting - Level of Assistance: 4: Min assist Sitting balance - Comments: Sitting on toilet to dress and bathing Static Standing Balance Static Standing - Balance Support: During functional activity;Right upper extremity supported;Left upper extremity supported Static Standing - Level of Assistance: 4: Min assist Dynamic Standing Balance Dynamic Standing - Balance Support: During functional activity Dynamic Standing - Level of Assistance: 4: Min assist;3: Mod assist Dynamic Standing - Comments: Standing to complete toileting and LB dressing tasks Extremity Assessment      RLE Assessment RLE Assessment: Exceptions to [facility] RLE AROM (degrees) RLE Overall AROM Comments: Decreased ankle DF RLE Strength RLE Overall Strength Comments: Grossly 4/5 proximal to distal with delayed initiation. 4-/5 ankle DF LLE Assessment LLE Assessment:  Within Functional Limits   See Function Navigator for Current Functional Status.   Refer to Care Plan for Long Term Goals  Recommendations for other services: Neuropsych and Therapeutic Recreation  Kitchen group  Discharge Criteria: Patient will be discharged from PT if patient refuses treatment 3 consecutive times without medical reason, if treatment goals not met, if there is a change in medical status, if patient makes no progress towards goals or if patient is discharged from hospital.  The above assessment, treatment plan, treatment alternatives and goals were discussed and mutually agreed upon: by patient  Lorie Phenix 11/06/2016, 8:57 AM

## 2016-11-06 NOTE — Progress Notes (Signed)
Subjective/Complaints: Pt c/o muscle pain Right trap and left low back/flank area ROS:  No CP/SOB, N/V/D appears anxious  Objective: Vital Signs: Blood pressure (!) 135/97, pulse 82, temperature 98.1 F (36.7 C), temperature source Oral, resp. rate 18, height '5\' 5"'$  (1.651 m), SpO2 100 %. Ct Head W Contrast  Result Date: 11/04/2016 CLINICAL DATA:  Right-sided weakness EXAM: CT HEAD WITHOUT CONTRAST TECHNIQUE: Contiguous axial images were obtained from the base of the skull through the vertex without intravenous contrast. COMPARISON:  Two days ago. FINDINGS: Brain: Acute infarct in the left basal ganglia and corona radiata shows no evidence of progression or hemorrhage. No evidence of new infarct. No hydrocephalus or mass effect. Vascular: No hyperdense vessel. Skull: No acute or aggressive finding. Sinuses/Orbits: Negative IMPRESSION: Known acute infarct in the left basal ganglia and corona radiata. No evidence of progression. No hemorrhagic conversion. Electronically Signed   By: Monte Fantasia M.D.   On: 11/04/2016 08:20   Results for orders placed or performed during the hospital encounter of 11/05/16 (from the past 72 hour(s))  CBC     Status: None   Collection Time: 11/05/16  6:15 PM  Result Value Ref Range   WBC 9.7 4.0 - 10.5 K/uL   RBC 5.10 4.22 - 5.81 MIL/uL   Hemoglobin 15.6 13.0 - 17.0 g/dL   HCT 47.2 39.0 - 52.0 %   MCV 92.5 78.0 - 100.0 fL   MCH 30.6 26.0 - 34.0 pg   MCHC 33.1 30.0 - 36.0 g/dL   RDW 14.8 11.5 - 15.5 %   Platelets 291 150 - 400 K/uL  Creatinine, serum     Status: None   Collection Time: 11/05/16  6:15 PM  Result Value Ref Range   Creatinine, Ser 1.21 0.61 - 1.24 mg/dL   GFR calc non Af Amer >60 >60 mL/min   GFR calc Af Amer >60 >60 mL/min    Comment: (NOTE) The eGFR has been calculated using the CKD EPI equation. This calculation has not been validated in all clinical situations. eGFR's persistently <60 mL/min signify possible Chronic  Kidney Disease.   CBC WITH DIFFERENTIAL     Status: None   Collection Time: 11/06/16  5:35 AM  Result Value Ref Range   WBC 6.7 4.0 - 10.5 K/uL   RBC 4.91 4.22 - 5.81 MIL/uL   Hemoglobin 14.9 13.0 - 17.0 g/dL   HCT 45.0 39.0 - 52.0 %   MCV 91.6 78.0 - 100.0 fL   MCH 30.3 26.0 - 34.0 pg   MCHC 33.1 30.0 - 36.0 g/dL   RDW 14.6 11.5 - 15.5 %   Platelets 257 150 - 400 K/uL   Neutrophils Relative % 58 %   Neutro Abs 3.9 1.7 - 7.7 K/uL   Lymphocytes Relative 33 %   Lymphs Abs 2.2 0.7 - 4.0 K/uL   Monocytes Relative 8 %   Monocytes Absolute 0.5 0.1 - 1.0 K/uL   Eosinophils Relative 1 %   Eosinophils Absolute 0.1 0.0 - 0.7 K/uL   Basophils Relative 0 %   Basophils Absolute 0.0 0.0 - 0.1 K/uL  Comprehensive metabolic panel     Status: Abnormal   Collection Time: 11/06/16  5:35 AM  Result Value Ref Range   Sodium 137 135 - 145 mmol/L   Potassium 3.3 (L) 3.5 - 5.1 mmol/L   Chloride 102 101 - 111 mmol/L   CO2 24 22 - 32 mmol/L   Glucose, Bld 117 (H) 65 - 99 mg/dL  BUN 16 6 - 20 mg/dL   Creatinine, Ser 1.18 0.61 - 1.24 mg/dL   Calcium 9.5 8.9 - 10.3 mg/dL   Total Protein 6.8 6.5 - 8.1 g/dL   Albumin 3.9 3.5 - 5.0 g/dL   AST 49 (H) 15 - 41 U/L   ALT 61 17 - 63 U/L   Alkaline Phosphatase 62 38 - 126 U/L   Total Bilirubin 0.8 0.3 - 1.2 mg/dL   GFR calc non Af Amer >60 >60 mL/min   GFR calc Af Amer >60 >60 mL/min    Comment: (NOTE) The eGFR has been calculated using the CKD EPI equation. This calculation has not been validated in all clinical situations. eGFR's persistently <60 mL/min signify possible Chronic Kidney Disease.    Anion gap 11 5 - 15     HEENT: normal Cardio: RRR and no murmur Resp: CTA B/L and unlabored GI: BS positive and NT, ND Extremity:  No Edema Skin:   Intact Neuro: Alert/Oriented, Abnormal Sensory parasthesia but intact LT RUE and Abnormal Motor 3+/5 R delt , bi, tri, grip, ankle DF, 4/5 R HF, KE,  5/5 on Left  Musc/Skel:  Other mild tenderness R trap  and Left flank Gen NAD   Assessment/Plan: 1. Functional deficits secondary to Left BG, CR infarct which require 3+ hours per day of interdisciplinary therapy in a comprehensive inpatient rehab setting. Physiatrist is providing close team supervision and 24 hour management of active medical problems listed below. Physiatrist and rehab team continue to assess barriers to discharge/monitor patient progress toward functional and medical goals. FIM:       Function - Toileting Toileting steps completed by patient: Adjust clothing prior to toileting, Performs perineal hygiene, Adjust clothing after toileting Toileting Assistive Devices: Grab bar or rail           Function - Comprehension Comprehension: Auditory Comprehension assist level: Follows complex conversation/direction with no assist  Function - Expression Expression: Verbal Expression assist level: Expresses complex ideas: With no assist  Function - Social Interaction Social Interaction assist level: Interacts appropriately with others - No medications needed.  Function - Problem Solving Problem solving assist level: Solves complex problems: Recognizes & self-corrects  Function - Memory Memory assist level: Complete Independence: No helper Patient normally able to recall (first 3 days only): Current season, Location of own room, Staff names and faces, That he or she is in a hospital  Medical Problem List and Plan: 1. Right side weaknesssecondary to left basal ganglia and left CR infarct -CIR PT, OT, SLP evals 2. DVT Prophylaxis/Anticoagulation: Subcutaneous Lovenox. Monitor platelet counts and any signs of bleeding, Plt 291K 3. Pain Management: Robaxin as needed 4. Mood: Provide emotional support 5. Neuropsych: This patient iscapable of making decisions on herown behalf. 6. Skin/Wound Care: Routine skin checks 7. Fluids/Electrolytes/Nutrition: Routine I&O BMET shows low K will  supplement 8.Hyperlipidemia. Lipitor 9.Polysubstance abuse. Counseling neuropsych  LOS (Days) 1 A FACE TO FACE EVALUATION WAS PERFORMED  Nailani Full E 11/06/2016, 6:54 AM

## 2016-11-06 NOTE — Progress Notes (Signed)
Social Work Assessment and Plan Social Work Assessment and Plan  Patient Details  Name: Philip Howard MRN: [id number] Date of Birth: Nov 20, 1965  Today's Date: 11/06/2016  Problem List:  Patient Active Problem List   Diagnosis Date Noted  . Thrombotic cerebral infarction ([place]) 11/05/2016  . Weakness of extremity   . Cocaine abuse   . Chronic diastolic congestive heart failure ([place])   . Smoker   . Alcohol abuse   . Hyperlipidemia   . Acute ischemic stroke ([place]) 11/03/2016  . Numbness on right side 11/02/2016  . Hypokalemia 11/02/2016  . Substance abuse 11/02/2016  . 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 Stroke [facility]) 11/02/2016   Past Medical History: No past medical history on file. Past Surgical History:  Past Surgical History:  Procedure Laterality Date  . arthroscopic surgery     Social History:  reports that he has been smoking Cigarettes.  He has been smoking about 0.50 packs per day. He has never used smokeless tobacco. He reports that he drinks alcohol. He reports that he uses drugs, including Marijuana.  Family / Support Systems Marital Status: Single Patient Roles: Partner, Parent, Other (Comment) (sibling & employee) Spouse/Significant Other: Hunt Oris [phone] Children: son in the home who requires supervision levle due to BI Other Supports: Pearlie Ferrell-sister [phone]   Anticipated Caregiver: Pt's fiance works during the day but he has nine siblings who are willing to assist him at discharge. Ability/Limitations of Caregiver: Hassan Rowan works during the day-sister's available to assist Caregiver Availability: 24/7 Family Dynamics: Close knit with his nine siblings, his fiance is supportive and here today to see him in therapies. Pt voiced his family will provide whatever he needs, someone is always around. Pt is furstrated he is not ding better, even though it is his first day here.  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 He has always been independent and able to take care of himself, he is not used to this. He is usually the one taking care of others, for instance his son. Recent Psychosocial Issues: caregiver for his son, did not go to the MD thought he was healthy Pyschiatric History: No history deferred depression screen appears to be doing well and anxious to go outside on his grounds pass. He would benefit from seeing neuro-psych while here for his anxiety and substance abuse issues. Will make referral Substance Abuse History: THC-daily and denies any other drug use, although was positive for cocaine when admitted to the hospital. Pt plans to quit his marijuana and tobacco, aware how bad it is for him and doesn't want to have another stroke.  Patient / Family Perceptions, Expectations & Goals Pt/Family understanding of illness & functional limitations: Pt and family can explain his stroke and deficits. They have spoken with the MD and feel he has answered their questions. Pt wants results now, which will need to be patient to see progress. Fiance and sister  are calming to pt and reassure him. Premorbid pt/family roles/activities: Father, finance, brother, employee, etc Anticipated changes in roles/activities/participation:  resume Pt/family expectations/goals: Pt states: " I want to be back to where I was and it wouold be nice to be now."  Fiance states: " He is not a patient person, he will do well here."  Sister states: " He needs to get a grip."  [employer]: None Premorbid Home Care/DME Agencies: None Transportation available at discharge: Family and fiance Resource referrals recommended: Neuropsychology, Support group (specify)  Discharge Planning Living Arrangements: Spouse/significant other, Children Support Systems: Spouse/significant other, Children, Other relatives, Friends/neighbors Type of Residence: Private residence Insurance underwriter Resources: Self-pay (uninsured) Museum/gallery curator Resources: Employment, Secondary school teacher Screen Referred: Yes Living Expenses: Higher education careers adviser Management: Patient, Significant Other Does the patient have any problems obtaining your medications?: Yes (Describe) (uninsured did not go to the MD) Home Management: Both he and fiance Patient/Family Preliminary Plans: Return home with son and fiance, along with his sister to provide supervision while fiance is working. All are here on day of eval and can see the progress he has made already since admission to the hospital. Discussed appling for Medicaid and disability and free medical clinic in [place]. [place]ill await team's evalutions and work on discharge needs. Social Work Anticipated Follow Up Needs: HH/OP, Support Group  Clinical Impression Pleasant jittery gentleman who is wanting to go outside, discussed if goes outside and smokes he will not be allowed to go back and his grounds pass will be revoked. He reports he is done smoking. Supportive family who can provide 24 hr supervision if needed. Will make neuro-psych referral for substance abuse issues, although pt denies some of them. Work on discharge needs and await therapy evaluations. Try to set up pt with a PCP via free medical clinic prior to  discharge.  Elease Hashimoto 11/06/2016, 1:12 PM

## 2016-11-07 ENCOUNTER — Inpatient Hospital Stay (HOSPITAL_COMMUNITY): Payer: Self-pay | Admitting: Physical Therapy

## 2016-11-07 ENCOUNTER — Inpatient Hospital Stay (HOSPITAL_COMMUNITY): Payer: Self-pay | Admitting: Occupational Therapy

## 2016-11-07 DIAGNOSIS — R269 Unspecified abnormalities of gait and mobility: Secondary | ICD-10-CM

## 2016-11-07 DIAGNOSIS — I69398 Other sequelae of cerebral infarction: Secondary | ICD-10-CM

## 2016-11-07 DIAGNOSIS — I6309 Cerebral infarction due to thrombosis of other precerebral artery: Secondary | ICD-10-CM

## 2016-11-07 NOTE — Progress Notes (Signed)
Subjective/Complaints: Muscle pain improving ROS:  No CP/SOB, N/V/D appears anxious  Objective: Vital Signs: Blood pressure 120/80, pulse 77, temperature 98.3 F (36.8 C), temperature source Oral, resp. rate 18, height '5\' 5"'$  (1.651 m), SpO2 100 %. No results found. Results for orders placed or performed during the hospital encounter of 11/05/16 (from the past 72 hour(s))  CBC     Status: None   Collection Time: 11/05/16  6:15 PM  Result Value Ref Range   WBC 9.7 4.0 - 10.5 K/uL   RBC 5.10 4.22 - 5.81 MIL/uL   Hemoglobin 15.6 13.0 - 17.0 g/dL   HCT 47.2 39.0 - 52.0 %   MCV 92.5 78.0 - 100.0 fL   MCH 30.6 26.0 - 34.0 pg   MCHC 33.1 30.0 - 36.0 g/dL   RDW 14.8 11.5 - 15.5 %   Platelets 291 150 - 400 K/uL  Creatinine, serum     Status: None   Collection Time: 11/05/16  6:15 PM  Result Value Ref Range   Creatinine, Ser 1.21 0.61 - 1.24 mg/dL   GFR calc non Af Amer >60 >60 mL/min   GFR calc Af Amer >60 >60 mL/min    Comment: (NOTE) The eGFR has been calculated using the CKD EPI equation. This calculation has not been validated in all clinical situations. eGFR's persistently <60 mL/min signify possible Chronic Kidney Disease.   CBC WITH DIFFERENTIAL     Status: None   Collection Time: 11/06/16  5:35 AM  Result Value Ref Range   WBC 6.7 4.0 - 10.5 K/uL   RBC 4.91 4.22 - 5.81 MIL/uL   Hemoglobin 14.9 13.0 - 17.0 g/dL   HCT 45.0 39.0 - 52.0 %   MCV 91.6 78.0 - 100.0 fL   MCH 30.3 26.0 - 34.0 pg   MCHC 33.1 30.0 - 36.0 g/dL   RDW 14.6 11.5 - 15.5 %   Platelets 257 150 - 400 K/uL   Neutrophils Relative % 58 %   Neutro Abs 3.9 1.7 - 7.7 K/uL   Lymphocytes Relative 33 %   Lymphs Abs 2.2 0.7 - 4.0 K/uL   Monocytes Relative 8 %   Monocytes Absolute 0.5 0.1 - 1.0 K/uL   Eosinophils Relative 1 %   Eosinophils Absolute 0.1 0.0 - 0.7 K/uL   Basophils Relative 0 %   Basophils Absolute 0.0 0.0 - 0.1 K/uL  Comprehensive metabolic panel     Status: Abnormal   Collection Time:  11/06/16  5:35 AM  Result Value Ref Range   Sodium 137 135 - 145 mmol/L   Potassium 3.3 (L) 3.5 - 5.1 mmol/L   Chloride 102 101 - 111 mmol/L   CO2 24 22 - 32 mmol/L   Glucose, Bld 117 (H) 65 - 99 mg/dL   BUN 16 6 - 20 mg/dL   Creatinine, Ser 1.18 0.61 - 1.24 mg/dL   Calcium 9.5 8.9 - 10.3 mg/dL   Total Protein 6.8 6.5 - 8.1 g/dL   Albumin 3.9 3.5 - 5.0 g/dL   AST 49 (H) 15 - 41 U/L   ALT 61 17 - 63 U/L   Alkaline Phosphatase 62 38 - 126 U/L   Total Bilirubin 0.8 0.3 - 1.2 mg/dL   GFR calc non Af Amer >60 >60 mL/min   GFR calc Af Amer >60 >60 mL/min    Comment: (NOTE) The eGFR has been calculated using the CKD EPI equation. This calculation has not been validated in all clinical situations. eGFR's persistently <60 mL/min  signify possible Chronic Kidney Disease.    Anion gap 11 5 - 15     HEENT: normal Cardio: RRR and no murmur Resp: CTA B/L and unlabored GI: BS positive and NT, ND Extremity:  No Edema Skin:   Intact Neuro: Alert/Oriented, Abnormal Sensory parasthesia but intact LT RUE and Abnormal Motor 3+/5 R delt , bi, tri, grip, ankle DF, 4/5 R HF, KE,  5/5 on Left  Musc/Skel:  Other mild tenderness R trap and Left flank Gen NAD   Assessment/Plan: 1. Functional deficits secondary to Left BG, CR infarct which require 3+ hours per day of interdisciplinary therapy in a comprehensive inpatient rehab setting. Physiatrist is providing close team supervision and 24 hour management of active medical problems listed below. Physiatrist and rehab team continue to assess barriers to discharge/monitor patient progress toward functional and medical goals. FIM: Function - Bathing Position: Shower Body parts bathed by patient: Right arm, Right upper leg, Left arm, Left upper leg, Chest, Abdomen, Right lower leg, Front perineal area, Left lower leg, Buttocks Body parts bathed by helper: Back Assist Level: Touching or steadying assistance(Pt > 75%)  Function- Upper Body  Dressing/Undressing What is the patient wearing?: Pull over shirt/dress Pull over shirt/dress - Perfomed by patient: Thread/unthread right sleeve, Thread/unthread left sleeve, Put head through opening, Pull shirt over trunk Assist Level: Supervision or verbal cues Function - Lower Body Dressing/Undressing What is the patient wearing?: Pants, Socks, Shoes Position: Wheelchair/chair at sink Pants- Performed by patient: Thread/unthread right pants leg, Thread/unthread left pants leg, Pull pants up/down Socks - Performed by patient: Don/doff right sock Socks - Performed by helper: Don/doff left sock Shoes - Performed by helper: Don/doff right shoe, Don/doff left shoe, Fasten right, Fasten left Assist for footwear: Partial/moderate assist Assist for lower body dressing: Touching or steadying assistance (Pt > 75%)  Function - Toileting Toileting steps completed by patient: Adjust clothing prior to toileting, Performs perineal hygiene, Adjust clothing after toileting Toileting Assistive Devices: Grab bar or rail Assist level: Touching or steadying assistance (Pt.75%)  Function - Air cabin crew transfer assistive device: Grab bar Assist level to toilet: Touching or steadying assistance (Pt > 75%) Assist level from toilet: Touching or steadying assistance (Pt > 75%)  Function - Chair/bed transfer Chair/bed transfer assist level: Touching or steadying assistance (Pt > 75%)  Function - Locomotion: Wheelchair Will patient use wheelchair at discharge?: No Function - Locomotion: Ambulation Assistive device: No device Max distance: 166f  Assist level: Touching or steadying assistance (Pt > 75%) Assist level: Touching or steadying assistance (Pt > 75%) Assist level: Touching or steadying assistance (Pt > 75%) Walk 150 feet activity did not occur: Refused Assist level: Touching or steadying assistance (Pt > 75%)  Function - Comprehension Comprehension: Auditory Comprehension assist  level: Follows complex conversation/direction with extra time/assistive device  Function - Expression Expression: Verbal Expression assist level: Expresses complex 90% of the time/cues < 10% of the time  Function - Social Interaction Social Interaction assist level: Interacts appropriately 90% of the time - Needs monitoring or encouragement for participation or interaction.  Function - Problem Solving Problem solving assist level: Solves complex problems: Recognizes & self-corrects  Function - Memory Memory assist level: Recognizes or recalls 90% of the time/requires cueing < 10% of the time Patient normally able to recall (first 3 days only): Current season, Location of own room, Staff names and faces, That he or she is in a hospital  Medical Problem List and Plan: 1. Right side weaknesssecondary to  left basal ganglia and left CR infarct -CIR PT, OT, SLP cont rehab 2. DVT Prophylaxis/Anticoagulation: Subcutaneous Lovenox. Monitor platelet counts, No signs of bleeding,  3. Pain Management: Robaxin as needed 4. Mood: Provide emotional support 5. Neuropsych: This patient iscapable of making decisions on herown behalf. 6. Skin/Wound Care: Routine skin checks 7. Fluids/Electrolytes/Nutrition: Routine I&O BMET shows low K will supplement 8.Hyperlipidemia. Lipitor 9.Polysubstance abuse. Counseling neuropsych, appreciate SW note  LOS (Days) 2 A FACE TO FACE EVALUATION WAS PERFORMED  Crissie Aloi E 11/07/2016, 9:11 AM

## 2016-11-07 NOTE — Progress Notes (Signed)
Physical Therapy Session Note  Patient Details  Name: Philip Howard MRN: [id number] Date of Birth: [birth year]  Today's Date: 11/07/2016 PT Individual Time: 1015-1130 AND 1604-1700 PT Individual Time Calculation (min): 75 min AND 56 min   Short Term Goals: Week 1:  PT Short Term Goal 1 (Week 1): Pt will ambulate 144f with supervision assist from PT PT Short Term Goal 2 (Week 1): Pt will perform bed mobility without cues or assist from PT PT Short Term Goal 3 (Week 1): Pt will perform stand pivot and sit<>stand transfers with Supervision assist consistently PT Short Term Goal 4 (Week 1): Pt will ascend 4 steps with Supervision assist and 1-2 UE support for improved access to the community.   Skilled Therapeutic Interventions/Progress Updates:      Pt received sitting in WC and agreeable to PT. PT instructed pt in WC mobility with BLE and LUE propulsion technique. Min cues from PT for maintenance of straight trajectory as well as doorway management.   Gait training x 2022fwith min assist from PT. PT required to provided moderate multomodal cues for improved hip/knee flexion as well as increased heel contact. Pt noted to have decrease Step width with increased distance. Able to correct with cues from PT.   Variable gait training instructed by PT.  Side stepping with min assist from PT 4 x 10 ft to L and R. PT required to stabilize pelvis to encourage gluteal activaiton.  Forward/backward 4 x 10 ft with min assist from PT and moderate cues for improved hip/knee flexion as well as hip extension to equalize step length.  Stepping over 2 inch obstacles x 4 with min assist. Pt noted to have improved heel contact when stepping over obstacle.  Weave around 4 cones x 4 with min assist from PT and cues from PT for improved pelvic stability and normalized step length.   Step ladder dynamic gait training:  One foot in each rung x 8 with min assist from PT. Moderate cues for improved push off and  hip/knee flexion to clear RLE with each step.  Side stepping with 2 feet in each rung x2 each direction with mod assist from PT to stablize pelvis and facilitate trunk extension. Pt noted to have improved step length and gluteal activation while maintaining improved postural alignment.   Nustep reciprocal movement training x 8 minutes, level 4, with R UE hand support. Min cues from PT for equalized cadence for R and L to improve reciprocal movement pattern.   Patient returned too [facility] and [facility] and all needs met.    Session 2.   Pt received supine in bed and agreeable to PT. Supine>sit transfer without assist and heavy use of bed rails.   Gait in room to use BR for standing urination. Min assist from PT for stability and safety.   WC mobility [id number] supervision assist as well as BUE and BLE propulsion. Min-mod cues for improved use of the RUE to maintain straight path as well as with improved turning technique to the L.   Gait training on unlevel cement sidewalk at hospital entrance, 300ft 60fwith min assist. Pt noted to have improved postural control and pelvic stability compared to previous PT treatment. Pt able to improve foot clearance and heel contact 50-75% of the time with moderate verbal instruction from PT. Pt reports Cramping in the L HS on each bout of gait training after approximately 200ft;35f  pain and tightness decreased after standing rest break  Patient returned too room and left sitting in [facility] with call bell in reach and all needs met.          Therapy Documentation Precautions:  Precautions Precautions: Fall Restrictions Weight Bearing Restrictions: No    Pain: Pain Assessment Pain Assessment: 0-10 Pain Score: 3  Pain Intervention(s): Repositioned;Distraction;Emotional support   See Function Navigator for Current Functional Status.   Therapy/Group: Individual Therapy  Lorie Phenix 11/07/2016,  11:30 AM

## 2016-11-07 NOTE — Progress Notes (Signed)
Occupational Therapy Session Note  Patient Details  Name: Philip Howard MRN: [id number] Date of Birth: 02-24-1966  Today's Date: 11/07/2016 OT Individual Time: 4827-0786 OT Individual Time Calculation (min): 55 min    Short Term Goals: Week 1:  OT Short Term Goal 1 (Week 1): Pt will use R UE at stabilizer level during grooming task with min A OT Short Term Goal 2 (Week 1): Pt will bathe with supervision sit<> stand with supervision. OT Short Term Goal 3 (Week 1): Pt will complete 2 grooming tasks in standing with supervision OT Short Term Goal 4 (Week 1): Pt will ambulate within room with RW and close supervision  Skilled Therapeutic Interventions/Progress Updates:    Pt seen for OT ADL bathing/dressing session. Pt sitting up in w/c upon arrival, ready for tx session. Denied pain.He ambulated throughout session with min A, VCs for safety awareness. He bathed seated on tub bench, with increased time and effort able to use R UE in functional manner to bathe L side of body. He required rest breaks throughout due to shoulder fatigue. Light massage provided to R trapezius for comfort. He dressed seated in w/c, standing with supervision pull pants up. Assist required to tye shoes due to decreased fine motor control and coordination.  He self propelled w/c to therapy gym using B LEs and L UE. With increased time, pt able to use R UE to remove shoe laces from shoe. Provided with elastic shoe laces following education on elastic shoe laces vs shoe buttons and pt desiring laces. He was able to don shoes independently following addition of elastic shoe laces.  Education provided regarding use of kinesiotape. Applied power strip to trapezius for comfort. Pt left in w/c at end of session, desiring to roll w/c around unit. Pt asking for grounds pass, PA made aware. Educated pt regarding strict no smoking policy while on grounds pass. Pt voiced understanding and stated "I"m done with that, this (the  stroke) was my wake up call to quit that."  Therapy Documentation Precautions:  Precautions Precautions: Fall Restrictions Weight Bearing Restrictions: No Pain:    See Function Navigator for Current Functional Status.   Therapy/Group: Individual Therapy  Lewis, Gwinda Passe 11/07/2016, 6:43 AM

## 2016-11-08 ENCOUNTER — Inpatient Hospital Stay (HOSPITAL_COMMUNITY): Payer: Self-pay | Admitting: Physical Therapy

## 2016-11-08 ENCOUNTER — Inpatient Hospital Stay (HOSPITAL_COMMUNITY): Payer: Self-pay | Admitting: Occupational Therapy

## 2016-11-08 DIAGNOSIS — E876 Hypokalemia: Secondary | ICD-10-CM

## 2016-11-08 DIAGNOSIS — I6300 Cerebral infarction due to thrombosis of unspecified precerebral artery: Secondary | ICD-10-CM

## 2016-11-08 NOTE — Progress Notes (Signed)
Physical Therapy Session Note  Patient Details  Name: Philip Howard MRN: [id number] Date of Birth: [birth year]  Today's Date: 11/08/2016 Philip Howard Individual Time: 0801-0900 AND 1550-1700 Philip Howard Individual Time Calculation (min): 59 min AND 70 min  Short Term Goals: Week 1:  Philip Howard Short Term Goal 1 (Week 1): Philip Howard will ambulate 14f with supervision assist from Philip Howard Philip Howard Short Term Goal 2 (Week 1): Philip Howard will perform bed mobility without cues or assist from Philip Howard Philip Howard Short Term Goal 3 (Week 1): Philip Howard will perform stand pivot and sit<>stand transfers with Supervision assist consistently Philip Howard Short Term Goal 4 (Week 1): Philip Howard will ascend 4 steps with Supervision assist and 1-2 UE support for improved access to the community.   Skilled Therapeutic Interventions/Progress Updates:   Philip Howard received standing in bathroom to perform perineal hygiene without assistance. Philip Howard educated Philip Howard on need for staff assistance at this time due to balance and gait deficits.   Gait in room from BR to sink to perform hand hygiene. Philip Howard provided supervision assist for safety. Additional gait training in hall with supervision assist for safety x 207f [id number] [id number] provided verbal and tactile cues to facilitate improved trunk rotation and pelvic stability in stance on the R LE.   Sit<>supine transfers x 2 in each direction without cues or assist from Philip Howard from flat surface.   Supine NMR to facilitate increased movement in the RLE and Rsided trunk: all completed x 12 BLE  Bridges SLR,  Heel slides,  Ankle DF/PF Hip abduction.   Seated NMR for lateral reaches L and R with BUE supported on therapy balls. Lateral lean with reach under glutes to facilitate increased trunk activaiton. Gait following lateral reaches with noted improvement in trunk mobility and decreased compensatory movements.   Philip Howard received supine in bed and agreeable to Philip Howard. Supine>sit transfer without assist or cues.   Gait in room Room to urinate at toilet and  perform hand hygiene. Philip Howard provided supervision assist for safety and cues for gait pattern.   Philip Howard instructed Philip Howard in Neuromuscular education with gait over various surfaces in various environments, including handicap ramp to parking deck. Grassed surface and unevencement sidewalk at Howard entrance. Philip Howard provided close supervision to min assist with tactile cues for improved reciprocal UE and trunk movement to facilitate pelvic rotation and improve timing and hip/knee flexion to prevent food drag on the RLE.   Gait on level surface in Howard x250ft 46f close supervision assist and min cues for decreased compensatory strategy through trunk .   Philip Howard instructed Philip Howard in DGI. See below.   Patient returned too room and left sitting in [facility] in reach and all needs met.           Therapy Documentation Precautions:  Precautions Precautions: Fall Restrictions Weight Bearing Restrictions: No  Balance: Balance Balance Assessed: Yes Standardized Balance Assessment Standardized Balance Assessment: Dynamic Gait Index Dynamic Gait Index Level Surface: Mild Impairment Change in Gait Speed: Mild Impairment Gait with Horizontal Head Turns: Mild Impairment Gait with Vertical Head Turns: Mild Impairment Gait and Pivot Turn: Mild Impairment Step Over Obstacle: Moderate Impairment Step Around Obstacles: Moderate Impairment Steps: Mild Impairment Total Score: 14   See Function Navigator for Current Functional Status.   Therapy/Group: Individual Therapy  AustinLorie Phenix2018, 9:03 AM

## 2016-11-08 NOTE — Progress Notes (Addendum)
Occupational Therapy Session Note  Patient Details  Name: Philip Howard MRN: [id number] Date of Birth: April 11, 1966  Today's Date: 11/08/2016 OT Individual Time: 1045-1200 OT Individual Time Calculation (min): 75 min    Short Term Goals: Week 1:  OT Short Term Goal 1 (Week 1): Pt will use R UE at stabilizer level during grooming task with min A OT Short Term Goal 2 (Week 1): Pt will bathe with supervision sit<> stand with supervision. OT Short Term Goal 3 (Week 1): Pt will complete 2 grooming tasks in standing with supervision OT Short Term Goal 4 (Week 1): Pt will ambulate within room with RW and close supervision  Skilled Therapeutic Interventions/Progress Updates:    Pt seen for OT session focusing on ADL re-training and fine motor coordination. Pt in supine upon arrival, agreeable to tx session. He ambulated throughout room with supervision to gather items in prep for showering task. He bathed at shower level, sit <> stand with use of grab bars for steadying assist and using R UE in functional manner with increased time/ concentration.  He dressed seated EOB, with increased time pt able to fasten pants using B UEs.  He ambulated throughout unit while holding styrofoam cup in R hand addressing dual task and concentration. Seated EOM, pt completed fine and gross motor activities with R UE focusing on full ROM and coordination without compensatory strategies, requiring VCs throughout, increased time and rest breaks. Completed 9 hole peg test, see results below.  Pt ambulated back to room in same manner as described above, VCs for R foot clearance. Pt left seated in recliner at end of session, all needs in reach and fiancee present.    9 hole peg test: R: 3 min 43 seconds L: 21.23 seconds  Therapy Documentation Precautions:  Precautions Precautions: Fall Restrictions Weight Bearing Restrictions: No Pain:   No/ denies pain  See Function Navigator for Current Functional  Status.   Therapy/Group: Individual Therapy  Lewis, Alesha Jaffee C 11/08/2016, 7:09 AM

## 2016-11-08 NOTE — IPOC Note (Signed)
Overall Plan of Care [facility]) Patient Details Name: Philip Howard MRN: [id number] DOB: Sep 08, 1965  Admitting Diagnosis: L CVA  Hospital Problems: Principal Problem:   Thrombotic cerebral infarction [facility])     Functional Problem List: Nursing Bowel, Medication Management, Pain, Safety, Skin Integrity  PT Balance, Behavior, Endurance, Motor, Perception, Safety, Sensory  OT Balance, Endurance, Safety, Perception, Pain, Motor, Sensory  SLP    TR         Basic ADL's: OT Grooming, Bathing, Dressing, Toileting     Advanced  ADL's: OT Simple Meal Preparation, Laundry, Light Housekeeping     Transfers: PT Bed Mobility, Bed to Chair, Musician, Hydrographic surveyor, Other (comment)  OT Toilet, Tub/Shower     Locomotion: PT Ambulation, Emergency planning/management officer, Stairs     Additional Impairments: OT Fuctional Use of Upper Extremity  SLP        TR      Anticipated Outcomes Item Anticipated Outcome  Self Feeding Mod I  Swallowing      Basic self-care  Mod I  Toileting  Mod I   Bathroom Transfers Supervision- mod I  Bowel/Bladder  Continent to bowel and bladder with min. assist.  Transfers  Mod I with LRAD   Locomotion  Mod I -Supervision assist with LRAD   Communication     Cognition     Pain  Less than 3,on 1 to 10 scale.  Safety/Judgment  Free from falls during his stay in rehab.   Therapy Plan: PT Intensity: Minimum of 1-2 x/day ,45 to 90 minutes PT Frequency: 5 out of 7 days PT Duration Estimated Length of Stay: 10-14 days  OT Intensity: Minimum of 1-2 x/day, 45 to 90 minutes OT Frequency: 5 out of 7 days OT Duration/Estimated Length of Stay: 14 days         Team Interventions: Nursing Interventions Patient/Family Education, Disease Management/Prevention, Pain Management, Bowel Management, Psychosocial Support, Discharge Planning  PT interventions Ambulation/gait training, Community reintegration, Training and development officer, Cognitive remediation/compensation,  Discharge planning, Disease management/prevention, DME/adaptive equipment instruction, Functional electrical stimulation, Functional mobility training, Neuromuscular re-education, Pain management, Patient/family education, Psychosocial support, Splinting/orthotics, Stair training, Therapeutic Activities, Therapeutic Exercise, UE/LE Strength taining/ROM, UE/LE Coordination activities, Visual/perceptual remediation/compensation, Wheelchair propulsion/positioning  OT Interventions Training and development officer, Discharge planning, Community reintegration, Engineer, drilling, Functional mobility training, Neuromuscular re-education, Pain management, Patient/family education, Psychosocial support, Self Care/advanced ADL retraining, Therapeutic Activities, Therapeutic Exercise, UE/LE Strength taining/ROM, UE/LE Coordination activities  SLP Interventions    TR Interventions    SW/CM Interventions Discharge Planning, Psychosocial Support, Patient/Family Education    Team Discharge Planning: Destination: PT-Home ,OT- Home , SLP-  Projected Follow-up: PT-Outpatient PT, OT-  Outpatient OT, SLP-  Projected Equipment Needs: PT-To be determined, Cane, Quad cane, OT- To be determined, SLP-  Equipment Details: PT- , OT-  Patient/family involved in discharge planning: PT- Patient,  OT-Patient, SLP-   MD ELOS: 10-14dd Medical Rehab Prognosis:  Excellent Assessment:  51 y.o.right handed malewith history of tobacco, [name] Per chart review and patient, patient lives with girlfriend independent prior to admission. Presented 11/02/2016 with right-sided weakness. Urine drug screen positive for cocaine and marijuana. CT reviewed, showing left basal ganglia infarct. Per report, MRI showed acute subacute nonhemorrhagic infarct involving the posterior limb of the internal capsule. 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 CTA of the head showed no emergent large vessel occlusion or severe stenosis. Echocardiogram with ejection fraction of 79% grade 1 diastolic dysfunction. Patient  did not receive TPA. Repeat cranial CT scan 11/04/2016 secondary to complaints of increased numbness to the right side showed no evidence of progression no hemorrhagic conversion. Neurology consulted presently on aspirin for CVA prophylaxis   Now requiring 24/7 Rehab RN,MD, as well as CIR level PT, OT and SLP.  Treatment team will focus on ADLs and mobility with goals set at Mod I See Team Conference Notes for weekly updates to the plan of care

## 2016-11-08 NOTE — Progress Notes (Signed)
Subjective/Complaints: Muscle pain complaints improved, seen during PT ROS:  No CP/SOB, N/V/D appears anxious  Objective: Vital Signs: Blood pressure 131/86, pulse 80, temperature 97.7 F (36.5 C), temperature source Oral, resp. rate 18, height '5\' 5"'$  (1.651 m), SpO2 100 %. No results found. Results for orders placed or performed during the hospital encounter of 11/05/16 (from the past 72 hour(s))  CBC     Status: None   Collection Time: 11/05/16  6:15 PM  Result Value Ref Range   WBC 9.7 4.0 - 10.5 K/uL   RBC 5.10 4.22 - 5.81 MIL/uL   Hemoglobin 15.6 13.0 - 17.0 g/dL   HCT 47.2 39.0 - 52.0 %   MCV 92.5 78.0 - 100.0 fL   MCH 30.6 26.0 - 34.0 pg   MCHC 33.1 30.0 - 36.0 g/dL   RDW 14.8 11.5 - 15.5 %   Platelets 291 150 - 400 K/uL  Creatinine, serum     Status: None   Collection Time: 11/05/16  6:15 PM  Result Value Ref Range   Creatinine, Ser 1.21 0.61 - 1.24 mg/dL   GFR calc non Af Amer >60 >60 mL/min   GFR calc Af Amer >60 >60 mL/min    Comment: (NOTE) The eGFR has been calculated using the CKD EPI equation. This calculation has not been validated in all clinical situations. eGFR's persistently <60 mL/min signify possible Chronic Kidney Disease.   CBC WITH DIFFERENTIAL     Status: None   Collection Time: 11/06/16  5:35 AM  Result Value Ref Range   WBC 6.7 4.0 - 10.5 K/uL   RBC 4.91 4.22 - 5.81 MIL/uL   Hemoglobin 14.9 13.0 - 17.0 g/dL   HCT 45.0 39.0 - 52.0 %   MCV 91.6 78.0 - 100.0 fL   MCH 30.3 26.0 - 34.0 pg   MCHC 33.1 30.0 - 36.0 g/dL   RDW 14.6 11.5 - 15.5 %   Platelets 257 150 - 400 K/uL   Neutrophils Relative % 58 %   Neutro Abs 3.9 1.7 - 7.7 K/uL   Lymphocytes Relative 33 %   Lymphs Abs 2.2 0.7 - 4.0 K/uL   Monocytes Relative 8 %   Monocytes Absolute 0.5 0.1 - 1.0 K/uL   Eosinophils Relative 1 %   Eosinophils Absolute 0.1 0.0 - 0.7 K/uL   Basophils Relative 0 %   Basophils Absolute 0.0 0.0 - 0.1 K/uL  Comprehensive metabolic panel     Status: Abnormal    Collection Time: 11/06/16  5:35 AM  Result Value Ref Range   Sodium 137 135 - 145 mmol/L   Potassium 3.3 (L) 3.5 - 5.1 mmol/L   Chloride 102 101 - 111 mmol/L   CO2 24 22 - 32 mmol/L   Glucose, Bld 117 (H) 65 - 99 mg/dL   BUN 16 6 - 20 mg/dL   Creatinine, Ser 1.18 0.61 - 1.24 mg/dL   Calcium 9.5 8.9 - 10.3 mg/dL   Total Protein 6.8 6.5 - 8.1 g/dL   Albumin 3.9 3.5 - 5.0 g/dL   AST 49 (H) 15 - 41 U/L   ALT 61 17 - 63 U/L   Alkaline Phosphatase 62 38 - 126 U/L   Total Bilirubin 0.8 0.3 - 1.2 mg/dL   GFR calc non Af Amer >60 >60 mL/min   GFR calc Af Amer >60 >60 mL/min    Comment: (NOTE) The eGFR has been calculated using the CKD EPI equation. This calculation has not been validated in all clinical situations.  eGFR's persistently <60 mL/min signify possible Chronic Kidney Disease.    Anion gap 11 5 - 15     HEENT: normal Cardio: RRR and no murmur Resp: CTA B/L and unlabored GI: BS positive and NT, ND Extremity:  No Edema Skin:   Intact Neuro: Alert/Oriented, Abnormal Sensory parasthesia but intact LT RUE and Abnormal Motor 3+/5 R delt , bi, tri, grip, ankle DF, 4/5 R HF, KE,  5/5 on Left  Musc/Skel:  Other mild tenderness R trap and Left flank Gen NAD Gait with min A foot drop on R with compensatory hip hiking  Assessment/Plan: 1. Functional deficits secondary to Left BG, CR infarct which require 3+ hours per day of interdisciplinary therapy in a comprehensive inpatient rehab setting. Physiatrist is providing close team supervision and 24 hour management of active medical problems listed below. Physiatrist and rehab team continue to assess barriers to discharge/monitor patient progress toward functional and medical goals. FIM: Function - Bathing Position: Shower Body parts bathed by patient: Right arm, Right upper leg, Left arm, Left upper leg, Chest, Abdomen, Right lower leg, Front perineal area, Left lower leg, Buttocks, Back Body parts bathed by helper: Back Assist  Level: Supervision or verbal cues  Function- Upper Body Dressing/Undressing What is the patient wearing?: Pull over shirt/dress Pull over shirt/dress - Perfomed by patient: Thread/unthread right sleeve, Thread/unthread left sleeve, Put head through opening, Pull shirt over trunk Assist Level: Set up Set up : To obtain clothing/put away Function - Lower Body Dressing/Undressing What is the patient wearing?: Pants, Socks, Shoes Position: Wheelchair/chair at sink Pants- Performed by patient: Thread/unthread right pants leg, Thread/unthread left pants leg, Pull pants up/down Socks - Performed by patient: Don/doff right sock Socks - Performed by helper: Don/doff left sock Shoes - Performed by patient: Don/doff right shoe, Don/doff left shoe Shoes - Performed by helper: Fasten right, Fasten left Assist for footwear: Partial/moderate assist Assist for lower body dressing: Supervision or verbal cues  Function - Toileting Toileting steps completed by patient: Adjust clothing prior to toileting, Performs perineal hygiene, Adjust clothing after toileting Toileting Assistive Devices: Grab bar or rail Assist level: Touching or steadying assistance (Pt.75%)  Function - Toilet Transfers Toilet transfer assistive device: Grab bar Assist level to toilet: Touching or steadying assistance (Pt > 75%) Assist level from toilet: Touching or steadying assistance (Pt > 75%)  Function - Chair/bed transfer Chair/bed transfer method: Stand pivot Chair/bed transfer assist level: Touching or steadying assistance (Pt > 75%)  Function - Locomotion: Wheelchair Will patient use wheelchair at discharge?: No Type: Manual Max wheelchair distance: 283ft  Assist Level: Supervision or verbal cues Assist Level: Supervision or verbal cues Assist Level: Supervision or verbal cues Function - Locomotion: Ambulation Assistive device: No device Max distance: 258ft  Assist level: Touching or steadying assistance (Pt >  75%) Assist level: Touching or steadying assistance (Pt > 75%) Assist level: Touching or steadying assistance (Pt > 75%) Walk 150 feet activity did not occur: Refused Assist level: Touching or steadying assistance (Pt > 75%) Assist level: Touching or steadying assistance (Pt > 75%)  Function - Comprehension Comprehension: Auditory Comprehension assist level: Follows complex conversation/direction with no assist  Function - Expression Expression: Verbal Expression assist level: Expresses complex ideas: With no assist  Function - Social Interaction Social Interaction assist level: Interacts appropriately with others with medication or extra time (anti-anxiety, antidepressant).  Function - Problem Solving Problem solving assist level: Solves complex problems: With extra time  Function - Memory Memory assist level: More  than reasonable amount of time Patient normally able to recall (first 3 days only): Current season, Location of own room, Staff names and faces, That he or she is in a hospital  Medical Problem List and Plan: 1. Right side weaknesssecondary to left basal ganglia and left CR infarct -CIR PT, OT, SLP cont rehab 2. DVT Prophylaxis/Anticoagulation: Subcutaneous Lovenox. Monitor platelet counts, No signs of bleeding,  3. Pain Management: Robaxin schedule, pain related to muscle substitution 4. Mood: Provide emotional support 5. Neuropsych: This patient iscapable of making decisions on herown behalf. 6. Skin/Wound Care: Routine skin checks 7. Fluids/Electrolytes/Nutrition: Routine I&O BMET shows low K will supplement recheck BMET in am 8.Hyperlipidemia. Lipitor 9.Polysubstance abuse. Counseling neuropsych, appreciate SW note  LOS (Days) 3 A FACE TO FACE EVALUATION WAS PERFORMED  Philip Howard E 11/08/2016, 8:19 AM

## 2016-11-09 ENCOUNTER — Inpatient Hospital Stay (HOSPITAL_COMMUNITY): Payer: Self-pay | Admitting: Physical Therapy

## 2016-11-09 ENCOUNTER — Inpatient Hospital Stay (HOSPITAL_COMMUNITY): Payer: Self-pay | Admitting: Occupational Therapy

## 2016-11-09 LAB — BASIC METABOLIC PANEL
ANION GAP: 10 (ref 5–15)
BUN: 26 mg/dL — ABNORMAL HIGH (ref 6–20)
CO2: 21 mmol/L — ABNORMAL LOW (ref 22–32)
Calcium: 9.5 mg/dL (ref 8.9–10.3)
Chloride: 104 mmol/L (ref 101–111)
Creatinine, Ser: 1.22 mg/dL (ref 0.61–1.24)
GFR calc Af Amer: >60 mL/min (ref >60)
GLUCOSE: 110 mg/dL — AB (ref 65–99)
Potassium: 3.6 mmol/L (ref 3.5–5.1)
Sodium: 135 mmol/L (ref 135–145)

## 2016-11-09 NOTE — Progress Notes (Signed)
Occupational Therapy Session Note  Patient Details  Name: Philip Howard MRN: [id number] Date of Birth: Aug 04, 1965  Today's Date: 11/09/2016 OT Individual Time: 1000-1100 OT Individual Time Calculation (min): 60 min    Short Term Goals: Week 1:  OT Short Term Goal 1 (Week 1): Pt will use R UE at stabilizer level during grooming task with min A OT Short Term Goal 2 (Week 1): Pt will bathe with supervision sit<> stand with supervision. OT Short Term Goal 3 (Week 1): Pt will complete 2 grooming tasks in standing with supervision OT Short Term Goal 4 (Week 1): Pt will ambulate within room with RW and close supervision  Skilled Therapeutic Interventions/Progress Updates:    Pt seen for OT ADL bathing/dressing session. Pt in recliner upon arrival, ready for tx session.  He ambulated to therapy gym with guarding assist. Completed standing peg board task while standing with L LE supported on block to facilitate weightbearing through R LE. Pt demonstrated improved in-hand manipulation skills to manage pegs, multimodal cuing provided to keep elbow by side to prevent compensatory strategies. 1 seated rest break required. Seated on EOM pt completed in hand manipulation activities manipulating small animal figurines. Pt demonstrates much improved in hand manipulation abilities compared to previous session. He ambulated back to room, carrying water bottles in both hands. Pt with decreased ability to maintain focus to keep grasp on B hands, dropping bottle 3 times on way to room, able to pick up bottle from floor with guarding assist. In room, pt ambulated to gather items in prep for showering task. Bathed seated on shower chair with distant supervision. He dressed in recliner with increased time.  Kinesiotape re-applied per pt request to trapezius and B hamstring.  Pt left in recliner at end of session, all needs in reach.    Therapy Documentation Precautions:  Precautions Precautions:  Fall Restrictions Weight Bearing Restrictions: No Pain:   No/ denies pain  See Function Navigator for Current Functional Status.   Therapy/Group: Individual Therapy  Lewis, Chantille Navarrete C 11/09/2016, 6:38 AM

## 2016-11-09 NOTE — Progress Notes (Signed)
  Subjective/Complaints: Denies pain In therapy  No complaints  Objective: Vital Signs: Blood pressure (!) 148/90, pulse 83, temperature 98 F (36.7 C), temperature source Oral, resp. rate 18, height 5\' 5"  (1.651 m), SpO2 100 %.     well-developed well-nourished male in no acute distress. HEENT exam atraumatic, normocephalic, neck supple without jugular venous distention. Chest clear to auscultation cardiac exam S1-S2 are regular. Abdominal exam  with bowel sounds, soft and nontender. Extremities no edema. Neurologic exam is alert    Assessment/Plan: 1. Functional deficits secondary to Left BG, CR infarct  Medical Problem List and Plan: 1. Right side weaknesssecondary to left basal ganglia and left CR infarct -CIR PT, OT, SLP cont rehab 2. DVT Prophylaxis/Anticoagulation: Subcutaneous Lovenox. Monitor platelet counts, No signs of bleeding, 3. Pain Management: pain adequately controlled 4. Mood: Provide emotional support 5. Neuropsych: This patient iscapable of making decisions on his own behalf. 6. Skin/Wound Care: Routine skin checks 7. Fluids/Electrolytes/Nutrition: Routine I&O BMET - hypolkalemia resolved  Basic Metabolic Panel:    Component Value Date/Time   NA 135 11/09/2016 0436   K 3.6 11/09/2016 0436   CL 104 11/09/2016 0436   CO2 21 (L) 11/09/2016 0436   BUN 26 (H) 11/09/2016 0436   CREATININE 1.22 11/09/2016 0436   GLUCOSE 110 (H) 11/09/2016 0436   CALCIUM 9.5 11/09/2016 0436    8.Hyperlipidemia. Lipitor 9.Polysubstance abuse. Counseling neuropsych, appreciate SW note  LOS (Days) 4 A FACE TO FACE EVALUATION WAS PERFORMED  Bruce H Swords 11/09/2016, 10:28 AM

## 2016-11-09 NOTE — Progress Notes (Signed)
Physical Therapy Session Note  Patient Details  Name: Philip Howard MRN: [id number] Date of Birth: 06-26-1965  Today's Date: 11/09/2016 PT Individual Time: 0800-0900 [id number] PT Individual Time Calculation (min): 60 min and 28 MIN   Short Term Goals: Week 1:  PT Short Term Goal 1 (Week 1): Pt will ambulate 151f with supervision assist from PT PT Short Term Goal 2 (Week 1): Pt will perform bed mobility without cues or assist from PT PT Short Term Goal 3 (Week 1): Pt will perform stand pivot and sit<>stand transfers with Supervision assist consistently PT Short Term Goal 4 (Week 1): Pt will ascend 4 steps with Supervision assist and 1-2 UE support for improved access to the community.   Skilled Therapeutic Interventions/Progress Updates:  Session1 Pt received supine in bed and agreeable to PT. Supine>sit transfer without assist or cues.   Pt instructed in gait training through hall x [id number] supervision assist from PT. Tactile cues for improved reciprocal arm swing. Pt noted to have imrpoved step symmetry on this day.  Dynamic gait training to step over 2, 1 inch canes, step up/down 4 inch step  and weave through 6 cones. PT provided supervision assist throughout with min assist on ascent and descent step.   Biodex balance to improve weight shifting and encourage hip and ankle strategy. Postural control x 4 minutes. LOS x 3 average of 3 trials 20%. Maze control x 3 average 12%. Patient noted to have increased difficulty  With anterior R weight shift.   Steps to RLE control. Step to leading with unaffected on ascent LE x 8. Reciprocal ascent and step-to with LLE descent x 8 PT provided supervision assist progressing to min assist with increased difficulty of task. Moderate cues for step pattern and improved eccentric control.   dynamc standing balance to tap 1 LE on 1 cone progressing to 2 cones min assist from PT and moderate cues for improved eccentric control and  pelvic stability. Increased difficulty on RLE compared to LLE due to poor ankle and hip control.   Patient returned too room and [facility] and all needs met.    SessRedgranite received supine in bed and agreeable to PT. Supine>sit transfer with out assist and or cues gait training in hall with emphasis on reciprocal trunk and arm rotation as well as improved step sequencing. Stepping over 4 inch bolsters 2x 6. Min assist from PT for safety as well as improved posture and WB over the RLE while stepping over with the LLE.  Step ladder to step one foot in each step and 3 sec SLS x 2. Min assist from PT for stability and activation of the R glutes. Side stepping in step ladder with min assist from PT and min cues for decreased compensation of trunk. Patient returned too room and [facility] in reach and all needs met.         Therapy Documentation Precautions:  Precautions Precautions: Fall Restrictions Weight Bearing Restrictions: No Pain:   0/10      See Function Navigator for Current Functional Status.   Therapy/Group: Individual Therapy  AustLorie Phenix6/2018, 9:00 AM

## 2016-11-09 NOTE — Progress Notes (Signed)
Physical Therapy Session Note  Patient Details  Name: OLIVIER FRAYRE MRN: [id number] Date of Birth: Jun 11, 1966  Today's Date: 11/09/2016 PT Individual Time: 1415-1500 PT Individual Time Calculation (min): 45 min    Skilled Therapeutic Interventions/Progress Updates:    Session initiated with pt sitting up in chair.  Pt denies pain.  Session focused on improving R UE/LE neuromotor control.  Pt performed nu-step on L 4 x 10 minutes working on R grip strength, step ups to 8 inch step working on controlling R LE hyperextension and lateral step downs from 4 inch step working on eccentric quad control.  Additionally worked on stepping over [employer], pt demos poor hip stability on R LE.  Also worked on [employer] on [address] while working on toe tapping L LE to step.  Pt ambulated to and from gym with CGA for approx 2 x 150 ft.  Vitals pre:  123/83;  HR:  96 bpm;  Vitals post:  158/84;  HR:  107.  Pt denies symptoms.  Therapy Documentation Precautions:  Precautions Precautions: Fall Restrictions Weight Bearing Restrictions: No   See Function Navigator for Current Functional Status.   Therapy/Group: Individual Therapy  Mckynzi Cammon Hilario Quarry 11/09/2016, 3:42 PM

## 2016-11-10 NOTE — Progress Notes (Signed)
  Subjective/Complaints: Feels well.  He wants to be more active and shows me that he can get up out of chair and walk. He knows to call nurses if he wants to ambulate.  Eating well  Objective: Vital Signs: Blood pressure 124/74, pulse 70, temperature 97.8 F (36.6 C), temperature source Oral, resp. rate 17, height 5\' 5"  (1.651 m), SpO2 100 %.   well-developed well-nourished male in no acute distress. HEENT exam atraumatic, normocephalic, neck supple without jugular venous distention. Chest clear to auscultation cardiac exam S1-S2 are regular. Abdominal exam  with bowel sounds, soft and nontender. Extremities no edema. Neurologic exam is alert with a broad based gait (but walks independently)   Assessment/Plan: 1. Functional deficits secondary to Left BG, CR infarct  Medical Problem List and Plan: 1. Right side weaknesssecondary to left basal ganglia and left CR infarct -CIR PT, OT, SLP cont rehab 2. DVT Prophylaxis/Anticoagulation: Subcutaneous Lovenox. Monitor platelet counts, No signs of bleeding, 3. Pain Management: pain adequately controlled 4. Mood: Provide emotional support 5. Neuropsych: This patient iscapable of making decisions on his own behalf. 6. Skin/Wound Care: Routine skin checks 7. Fluids/Electrolytes/Nutrition: Routine I&O BMET - hypolkalemia resolved  Basic Metabolic Panel:    Component Value Date/Time   NA 135 11/09/2016 0436   K 3.6 11/09/2016 0436   CL 104 11/09/2016 0436   CO2 21 (L) 11/09/2016 0436   BUN 26 (H) 11/09/2016 0436   CREATININE 1.22 11/09/2016 0436   GLUCOSE 110 (H) 11/09/2016 0436   CALCIUM 9.5 11/09/2016 0436    8.Hyperlipidemia. Lipitor 9.Polysubstance abuse.  LOS (Days) 5 A FACE TO FACE EVALUATION WAS PERFORMED  Philip Howard 11/10/2016, 8:58 AM

## 2016-11-11 ENCOUNTER — Inpatient Hospital Stay (HOSPITAL_COMMUNITY): Payer: Self-pay | Admitting: Occupational Therapy

## 2016-11-11 ENCOUNTER — Inpatient Hospital Stay (HOSPITAL_COMMUNITY): Payer: Self-pay | Admitting: Physical Therapy

## 2016-11-11 DIAGNOSIS — R7989 Other specified abnormal findings of blood chemistry: Secondary | ICD-10-CM

## 2016-11-11 NOTE — Progress Notes (Signed)
Subjective/Complaints: Muscle pain complaints improved, seen during PT ROS:  No CP/SOB, N/V/D appears anxious  Objective: Vital Signs: Blood pressure 103/68, pulse 69, temperature 98.2 F (36.8 C), temperature source Oral, resp. rate 18, height '5\' 5"'$  (1.651 m), SpO2 100 %. No results found. Results for orders placed or performed during the hospital encounter of 11/05/16 (from the past 72 hour(s))  Basic metabolic panel     Status: Abnormal   Collection Time: 11/09/16  4:36 AM  Result Value Ref Range   Sodium 135 135 - 145 mmol/L   Potassium 3.6 3.5 - 5.1 mmol/L   Chloride 104 101 - 111 mmol/L   CO2 21 (L) 22 - 32 mmol/L   Glucose, Bld 110 (H) 65 - 99 mg/dL   BUN 26 (H) 6 - 20 mg/dL   Creatinine, Ser 1.22 0.61 - 1.24 mg/dL   Calcium 9.5 8.9 - 10.3 mg/dL   GFR calc non Af Amer >60 >60 mL/min   GFR calc Af Amer >60 >60 mL/min    Comment: (NOTE) The eGFR has been calculated using the CKD EPI equation. This calculation has not been validated in all clinical situations. eGFR's persistently <60 mL/min signify possible Chronic Kidney Disease.    Anion gap 10 5 - 15     HEENT: normal Cardio: RRR Resp: CTA B without distress GI: BS positive and NT, ND Extremity:  No Edema Skin:   Intact Neuro: Alert/Oriented, Abnormal Sensory parasthesia but intact LT RUE and Abnormal Motor 3+/5 R delt , bi, tri, grip, ankle DF, 4/5 R HF, KE,  5/5 on Left  Musc/Skel:  minimal tenderness R trap and Left flank Gen NAD Gait with min A foot drop on R with compensatory hip hiking  Assessment/Plan: 1. Functional deficits secondary to Left BG, CR infarct which require 3+ hours per day of interdisciplinary therapy in a comprehensive inpatient rehab setting. Physiatrist is providing close team supervision and 24 hour management of active medical problems listed below. Physiatrist and rehab team continue to assess barriers to discharge/monitor patient progress toward functional and medical  goals. FIM: Function - Bathing Position: Shower Body parts bathed by patient: Right arm, Left lower leg, Left arm, Back, Abdomen, Chest, Front perineal area, Left upper leg, Right lower leg, Right upper leg, Buttocks Body parts bathed by helper: Back Assist Level: Supervision or verbal cues  Function- Upper Body Dressing/Undressing What is the patient wearing?: Pull over shirt/dress Pull over shirt/dress - Perfomed by patient: Thread/unthread right sleeve, Thread/unthread left sleeve, Put head through opening, Pull shirt over trunk Assist Level: More than reasonable time Set up : To obtain clothing/put away Function - Lower Body Dressing/Undressing What is the patient wearing?: Pants, Socks Position: Wheelchair/chair at sink Pants- Performed by patient: Thread/unthread right pants leg, Thread/unthread left pants leg, Pull pants up/down, Fasten/unfasten pants Socks - Performed by patient: Don/doff right sock, Don/doff left sock Socks - Performed by helper: Don/doff left sock Shoes - Performed by patient: Don/doff right shoe, Don/doff left shoe Shoes - Performed by helper: Fasten right, Fasten left Assist for footwear: Setup Assist for lower body dressing: Set up, Supervision or verbal cues Set up : To obtain clothing/put away  Function - Toileting Toileting steps completed by patient: Adjust clothing prior to toileting, Performs perineal hygiene, Adjust clothing after toileting Toileting Assistive Devices: Grab bar or rail Assist level: Supervision or verbal cues  Function - Toilet Transfers Toilet transfer assistive device: Grab bar Assist level to toilet: Supervision or verbal cues Assist level from  toilet: Supervision or verbal cues  Function - Chair/bed transfer Chair/bed transfer method: Stand pivot Chair/bed transfer assist level: Supervision or verbal cues  Function - Locomotion: Wheelchair Will patient use wheelchair at discharge?: No Type: Manual Max wheelchair  distance: 273f  Assist Level: Supervision or verbal cues Assist Level: Supervision or verbal cues Assist Level: Supervision or verbal cues Function - Locomotion: Ambulation Assistive device: No device Max distance: 2022f Assist level: Supervision or verbal cues Assist level: Supervision or verbal cues Assist level: Supervision or verbal cues Walk 150 feet activity did not occur: Refused Assist level: Supervision or verbal cues Assist level: Supervision or verbal cues  Function - Comprehension Comprehension: Auditory Comprehension assist level: Follows complex conversation/direction with no assist  Function - Expression Expression: Verbal Expression assist level: Expresses complex ideas: With no assist  Function - Social Interaction Social Interaction assist level: Interacts appropriately with others - No medications needed.  Function - Problem Solving Problem solving assist level: Solves complex problems: Recognizes & self-corrects  Function - Memory Memory assist level: Complete Independence: No helper Patient normally able to recall (first 3 days only): Current season, Location of own room, Staff names and faces, That he or she is in a hospital  Medical Problem List and Plan: 1. Right side weaknesssecondary to left basal ganglia and left CR infarct -CIR PT, OT, SLP cont rehab 2. DVT Prophylaxis/Anticoagulation: Subcutaneous Lovenox. Monitor platelet counts, No signs of bleeding,  3. Pain Management: Robaxin schedule, pain related to muscle substitution  -improved pain levels 4. Mood: Provide emotional support 5. Neuropsych: This patient iscapable of making decisions on herown behalf. 6. Skin/Wound Care: Routine skin checks 7. Fluids/Electrolytes/Nutrition: potassium improved on Saturday  -push fluids  -recheck bmet in am 8.Hyperlipidemia. Lipitor 9.Polysubstance abuse. Counseling neuropsych, appreciate SW note  LOS (Days) 6 A FACE TO FACE  EVALUATION WAS PERFORMED  SWARTZ,ZACHARY T 11/11/2016, 10:09 AM

## 2016-11-11 NOTE — Progress Notes (Addendum)
Physical Therapy Session Note  Patient Details  Name: Philip Howard MRN: [id number] Date of Birth: 1966/03/13  Today's Date: 11/11/2016 PT Individual Time: 5520-8022 PT Individual Time Calculation (min): 75 min    Skilled Therapeutic Interventions/Progress Updates:  Pt received in room & agreeable to tx. Session focused on functional mobility (gait, transfers, stair negotiation), strengthening, NMR, endurance training, & balance.  Pt ambulated throughout room & unit without AD & mod I. Pt with continent void standing in bathroom with distant supervision. Pt utilized cybex kinetron in standing without BUE support with task focusing on weight shifting L/R, R NMR, and hip strengthening. Pt requires steady assist overall for balance and cuing for forward gaze. Pt completed functional transfers (SUV simulated car, furniture transfer in rocking recliner) and bed mobility in apartment with Mod I. Pt performed forward and lateral step ups on 6" step with 1 UE or no UE support with task focusing on improving R hip/knee flexion versus circumduction to ascend step, and RLE strengthening/NMR. Pt utilized rocker board with anterior/posterior and lateral challenges to balance with eyes open & closed without UE support and min assist for balance. Pt utilized nu-step up to level 5 x 10 minutes with all four extremities with task focusing on coordination of reciprocal movement & endurance training (pt diaphoretic during task but reports this is normal). Pt able to maintain RUE grasp on handle for entire duration of task. At end of session pt left sitting in w/c in room with all needs within reach.   Therapy Documentation Precautions:  Precautions Precautions: Fall Restrictions Weight Bearing Restrictions: No  Pain: Denies c/o pain, noting numbness in R hand.   See Function Navigator for Current Functional Status.   Therapy/Group: Individual Therapy  Waunita Schooner 11/11/2016, 11:27 AM

## 2016-11-11 NOTE — Progress Notes (Signed)
Social Work Patient ID: Philip Howard, male   DOB: 1966-01-11, 51 y.o.   MRN: [id number]  Team feels pt will be ready for discharge on Wed have checked with MD he is on board and pt very pleased with this plan. Will work on discharge needs and any equipment needs.

## 2016-11-11 NOTE — Progress Notes (Signed)
Occupational Therapy Session Note  Patient Details  Name: Philip Howard MRN: [id number] Date of Birth: Nov 04, 1965  Today's Date: 11/11/2016 OT Individual Time: 7673-4193 and 1400-1500 OT Individual Time Calculation (min): 60 min and 60 min   Short Term Goals: Week 1:  OT Short Term Goal 1 (Week 1): Pt will use R UE at stabilizer level during grooming task with min A OT Short Term Goal 2 (Week 1): Pt will bathe with supervision sit<> stand with supervision. OT Short Term Goal 3 (Week 1): Pt will complete 2 grooming tasks in standing with supervision OT Short Term Goal 4 (Week 1): Pt will ambulate within room with RW and close supervision  Skilled Therapeutic Interventions/Progress Updates:    Session One: Pt seen for OT session addressing IADL goals. Pt sitting up in recliner upon arrival eating breakfast, agreeable to tx session. Pt observed to be self feeding with L UE. Provided pt witih built up gripper and encouraged to attempt eating portion of meal with dominant though weaker R UE. He demonstrates ability to manage drinking glass with increased time using R UE. He ambulated throughout session without AD and supervision. He carried meal tray in hallway to tray return closet focusing on dual task and concentration. In ADL apartment, completed simulated tub/shower transfer utilizing tub transfer bench as well as practicing stepping over tub wall. Also simulated stepping into shower stall as pt as all options available at home. Each transfer completed at supervision- mod I level. Recommended to pt to sit to complete bathing tasks for energy conservation and reduce fall risk. In kitchen,  completed simple meal prep activity making scrambled egg at stove level. Pt actively incorporated R UE into functional tasks, using at non-dominant level. Educated regarding decreased sensation in R hand and functional implications during cooking/ IADL tasks.  He completed laundry activity at supervision-  mod I level, able to use R UE to manage controls and towels. He folded laundry from standing position using B UEs simultaneously. Educated regarding incorporating B UEs into functional tasks as part of HEP. He returned to room carrying laundry basket mod I. Pt left in w/c at end of session, propelling w/c throughout unit with B LEs.  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 Made CSW, MD and PT aware of pt's desires and planning for upcoming d/c.   Session Two: Pt seen for OT session focusing on functional ambulation, activity tolerance, and ADL re-training. Pt sitting up in recliner upon arrival with visitors present. Pt agreeable to tx session. He voiced desire to go outside. He ambulated with visitors to [employer] to say "good bye" to visitors. He ambulated throughout session without AD and supervision, he displayed better R foot clearance this session compared to previous session. He ambulated over thresholds and uneven surfaces with supervision demonstrated good functional activity tolerance,requiring one standing rest break during off unit ambulation. He returned to room and completed showering task. He gathered items in room in prep for showering task with supervision and then bathed seated on tub bench mod I. He returned to room to dress from seated position. Kinesiotape re-applied to traps, obliques and hamstrings per pt request and light massage provided to traps as well due to tightness/ fatigue from compensatory strategies. Pt returned to recliner at end of session, left seated with all needs in reach and sons present.    Therapy Documentation Precautions:  Precautions Precautions: Fall Restrictions Weight Bearing Restrictions:  No Pain:   No/ denies pain  See Function Navigator for Current Functional Status.   Therapy/Group: Individual Therapy  Lewis, Keyaria Lawson C 11/11/2016, 7:06 AM

## 2016-11-12 ENCOUNTER — Inpatient Hospital Stay (HOSPITAL_COMMUNITY): Payer: Self-pay | Admitting: Occupational Therapy

## 2016-11-12 ENCOUNTER — Encounter: Payer: Self-pay | Admitting: Physical Medicine & Rehabilitation

## 2016-11-12 ENCOUNTER — Inpatient Hospital Stay (HOSPITAL_COMMUNITY): Payer: Self-pay | Admitting: Physical Therapy

## 2016-11-12 MED ORDER — ATORVASTATIN CALCIUM 40 MG PO TABS: 40.0000 mg | ORAL_TABLET | Freq: Every day | ORAL | 1 refills | Status: DC

## 2016-11-12 MED ORDER — METHOCARBAMOL 500 MG PO TABS: 500.0000 mg | ORAL_TABLET | Freq: Three times a day (TID) | ORAL | 0 refills | Status: DC | PRN

## 2016-11-12 NOTE — Discharge Summary (Signed)
Discharge summary job [id number]

## 2016-11-12 NOTE — Discharge Summary (Signed)
NAMEMarland Howard  BUNYAN, BRIER NO.:  [id number]  MEDICAL RECORD NO.:  [id number]  LOCATION:                                 FACILITY:  PHYSICIAN:  Charlett Blake, M.D.   DATE OF BIRTH:  DATE OF ADMISSION:  11/05/2016 DATE OF DISCHARGE:  11/12/2016                              DISCHARGE SUMMARY   DISCHARGE DIAGNOSES: 1. 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 2. Subcutaneous Lovenox for deep venous thrombosis prophylaxis. 3. Pain management. 4. Hypertension. 5. Hyperlipidemia. 6. Polysubstance abuse.  HISTORY OF PRESENT ILLNESS:  This is a 51 year old, right-handed male, history of tobacco, polysubstance abuse, on no prescription medication, who lives with girlfriend, independent prior to admission.  Presented on Nov 02, 2016, with right-sided weakness.  Urine drug screen, positive cocaine, marijuana.  CT reviewed, showing left basal ganglia infarct. MRI, acute/subacute nonhemorrhagic infarct involving the posterior limb of the internal capsule.  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  CTA of the head showed no emergent large vessel occlusion or stenosis.  Echocardiogram with ejection fraction of 16%, grade 1 diastolic dysfunction.  The patient did not receive tPA.  Neurology consulted, maintained on aspirin for CVA prophylaxis.  Subcutaneous Lovenox for DVT prophylaxis.  The patient was admitted for comprehensive rehab program.  PAST MEDICAL HISTORY:  See discharge diagnoses.  SOCIAL HISTORY:  Lives with girlfriend.  Independent prior to admission.  FUNCTIONAL STATUS:  Upon admission to [facility], was moderate assist 20 feet, one-person handheld assist, minimal assist sit to stand, min-to-mod assist activities of daily living.  PHYSICAL EXAMINATION:  VITAL SIGNS:  Blood pressure 160/81, pulse 92, temperature 97, respirations 18. GENERAL:  Alert male, in no acute distress.  Followed  full commands. LUNGS:  Clear to auscultation without wheeze. ABDOMEN:  Soft, nontender.  Good bowel sounds. HEENT:  EOMs intact. NECK:  Supple.  Nontender.  No JVD. CARDIAC:  Rate controlled.  REHABILITATION HOSPITAL COURSE:  The patient was admitted to Inpatient Rehab Services with therapies initiated on a 3-hour daily basis, consisting of physical therapy, occupational therapy, and rehabilitation nursing.  The following issues were addressed during the patient's rehabilitation stay.  Pertaining to Mr. Caddell's left basal ganglia, left corona radiata infarct remained stable, maintained on aspirin therapy.  Planned followup with Neurology Services.  Blood pressures controlled.  He remained on Lipitor for hyperlipidemia.  He did have some muscle spasms, controlled nicely with Robaxin.  Long history of polysubstance, tobacco abuse.  Urine drug screen was positive for cocaine, marijuana.  The patient received full counsel in regard to cessation of illicit drug use as well as alcohol.  It was questionable if he would be compliant with these requests.  The patient received weekly collaborative interdisciplinary team conferences to discuss estimated length of stay, family teaching, any barriers to discharge. Ambulates throughout the unit without assistive device, modified independent.  Gather belongings for activities of daily living and homemaking.  Completed functional transfers with modified independence. Again, discussed at length, no driving on discharge.  MEDICATIONS:  Discharge medications included: 1. Aspirin 325 mg p.o. daily. 2. Lipitor 40 mg p.o. daily. 3. Robaxin 500 mg 3  times daily as needed muscle spasms.  DIET:  His diet was regular.  FOLLOWUP:  The patient would follow up with Dr. Alysia Penna at the Outpatient Rehab Service office as advised; Dr. Erlinda Hong, Neurology Service, call for appointment.  SPECIAL INSTRUCTIONS:  No alcohol, smoking, or illicit drug use as  well as no driving.     Lauraine Rinne, P.A.   ______________________________ Charlett Blake, M.D.    DA/MEDQ  D:  11/12/2016  T:  11/12/2016  Job:  [id number]  cc:   Charlett Blake, M.D. Dr. Rosalin Hawking

## 2016-11-12 NOTE — Progress Notes (Addendum)
Occupational Therapy Session Note  Patient Details  Name: Philip Howard MRN: [id number] Date of Birth: Jun 07, 1966  Today's Date: 11/12/2016 OT Individual Time: 1345-1453 OT Individual Time Calculation (min): 68 min    Short Term Goals: Week 1:  OT Short Term Goal 1 (Week 1): Pt will use R UE at stabilizer level during grooming task with min A OT Short Term Goal 2 (Week 1): Pt will bathe with supervision sit<> stand with supervision. OT Short Term Goal 3 (Week 1): Pt will complete 2 grooming tasks in standing with supervision OT Short Term Goal 4 (Week 1): Pt will ambulate within room with RW and close supervision  Skilled Therapeutic Interventions/Progress Updates:    Pt seen for OT session focusing on UE strengthening and coordination. Pt in room upon arrival, ready for session and excited to d/c home following session. He voiced having completed bathing/dressing prior to tx session now that he is mod I in room, voiced no troubles. Ambulated throughout session mod I, no AD. In therapy gym, Completed 9 hole peg test, see results below. He completed UE strengthening/ROM activities focusing non-compensatory strategies, pt demonstrating much improved shoulder flexion and elbow extension  Strength/ ROM without use of compensatory strategies this session.   Competed standing exercises using #4 weighted ball focusing on full ROM and full body exercises. Pt provided with hand out of theraband exercises. He completed x2 sets of 12 chest expansion, bicep curls, diagonal up diagonal down, and upright row. Verbal and demonstration cuing provided for proper form and technique. Educated regarding breathing patterns for exercises as well as importance of proper form and technique for exercises.  He ambulated back to room at end of session, left in room with family members present preparing for d/c.   9 hole peg test:  R: 1 min 1 second, 1 min 2 second, and 56.50 seconds L: 18.65 seconds, 23.13  seconds, and 18.76 seconds  Therapy Documentation Precautions:  Precautions Precautions: Fall Restrictions Weight Bearing Restrictions: No Pain:   No/ denies pain  See Function Navigator for Current Functional Status.   Therapy/Group: Individual Therapy  Lewis, Vilma Will C 11/12/2016, 7:17 AM

## 2016-11-12 NOTE — Progress Notes (Signed)
Social Work  Discharge Note  The overall goal for the admission was met for:   Discharge location: [place]  Length of Stay: Yes-7 DAYS  Discharge activity level: Yes-MOD/I-INDEPENDENT LEVEL  Home/community participation: Yes  Services provided included: MD, RD, PT, OT, SLP, RN, CM, Pharmacy, Neuropsych and SW  Financial Services: Other: UNINSURED-HAS NOT APPLIED FOR MEDICAID YET  Follow-up services arranged: Other: NO FOLLOW UP RECOMMDED AND NO EQUIPMENT NEEDS  Comments (or additional information):PT DID VERY WELL HERE AND MADE PROGRESS QUICKLY. AWARE OF NEED TO QUIT SUBSTANCE ABUSE BUT DOES NOT WANT RESOURCES. FEELS HE CAN QUIT ON HIS OWN. GAVE INFORMATION ON FREE CLINIC IN [facility] AND ELIGIBILITY. HE WILL FOLLOW UP WITH. GAVE HIM THE COMPLETED [employer] FORM.   Patient/Family verbalized understanding of follow-up arrangements: Yes  Individual responsible for coordination of the follow-up plan: SELF & BRENDA-FIANACE  Confirmed correct DME delivered: Elease Hashimoto 11/12/2016    Elease Hashimoto

## 2016-11-12 NOTE — Progress Notes (Addendum)
Social Work Patient ID: Philip Howard, male   DOB: November 21, 1965, 51 y.o.   MRN: [id number] MD feels pt is ready for discharge today after his therapies, pt agreeable to this. Will work on discharge needs for today. Gave pt information on the [facility] in [place] for medical follow up. He will need to apply in person and complete elgibility forms. No follow up recommended nor equipment.

## 2016-11-12 NOTE — Progress Notes (Signed)
Occupational Therapy Discharge Summary  Patient Details  Name: Philip Howard MRN: [id number] Date of Birth: 08-10-1965  Patient has met 71 of 14 long term goals due to improved activity tolerance, improved balance, postural control, functional use of  RIGHT upper extremity and improved coordination.  Patient to discharge at overall Modified Independent level with supervision for shower transfers.  Pt made excellent progress while in IPR. Pt cont to have deficits with R UE, using compensatory strategies for full shoulder ROM and complaints of impaired sensation in R finger tips.   Recommendation:  Patient will benefit from ongoing skilled OT services in outpatient setting to continue to advance functional skills in the area of BADL and iADL.  Equipment: No equipment provided, pt already has shower DME.   Reasons for discharge: treatment goals met and discharge from hospital  Patient/family agrees with progress made and goals achieved: Yes  OT Discharge Precautions/Restrictions  Precautions Precautions: Fall Restrictions Weight Bearing Restrictions: No Vision Baseline Vision/History: Wears glasses Wears Glasses: Reading only Patient Visual Report: No change from baseline Vision Assessment?: No apparent visual deficits Eye Alignment: Within Functional Limits Cognition Overall Cognitive Status: Within Functional Limits for tasks assessed Arousal/Alertness: Awake/alert Orientation Level: Oriented X4 Sustained Attention: Appears intact Selective Attention: Appears intact Memory: Appears intact Awareness: Appears intact Problem Solving: Appears intact Behaviors: Impulsive Safety/Judgment: Appears intact Sensation Sensation Light Touch: Appears Intact Stereognosis: Appears Intact Proprioception: Appears Intact Coordination Gross Motor Movements are Fluid and Coordinated: Yes Fine Motor Movements are Fluid and Coordinated: No Finger Nose Finger Test: Decreased accuracy R  UE; WFL L UE 9 Hole Peg Test: R: 1 min 1 second, 1 min 2 seconds, and 56.50 seconds   L: 18.65 seconds, 23.13 seconds, and 18.76 seconds Motor  Motor Motor: Hemiplegia Motor - Discharge Observations: R side Hemiplegia  Mobility  Bed Mobility Bed Mobility: Rolling Right;Rolling Left;Sit to Supine;Supine to Sit Rolling Right: 6: Modified independent (Device/Increase time) Rolling Left: 6: Modified independent (Device/Increase time) Supine to Sit: 6: Modified independent (Device/Increase time) Sit to Supine: 6: Modified independent (Device/Increase time) Transfers Sit to Stand: 6: Modified independent (Device/Increase time)  Trunk/Postural Assessment  Cervical Assessment Cervical Assessment: Within Functional Limits Thoracic Assessment Thoracic Assessment: Within Functional Limits Lumbar Assessment Lumbar Assessment: Exceptions to [facility] (Posterior pelvic tilt) Postural Control Postural Control: Deficits on evaluation  Balance Balance Balance Assessed: Yes Standardized Balance Assessment Standardized Balance Assessment: Berg Balance Test Berg Balance Test Sit to Stand: Able to stand without using hands and stabilize independently Standing Unsupported: Able to stand safely 2 minutes Sitting with Back Unsupported but Feet Supported on Floor or Stool: Able to sit safely and securely 2 minutes Stand to Sit: Sits safely with minimal use of hands Transfers: Able to transfer safely, minor use of hands Standing Unsupported with Eyes Closed: Able to stand 10 seconds safely Standing Ubsupported with Feet Together: Able to place feet together independently and stand 1 minute safely From Standing, Reach Forward with Outstretched Arm: Can reach confidently >25 cm (10") From Standing Position, Pick up Object from Floor: Able to pick up shoe safely and easily From Standing Position, Turn to Look Behind Over each Shoulder: Looks behind one side only/other side shows less weight shift Turn 360  Degrees: Able to turn 360 degrees safely one side only in 4 seconds or less Standing Unsupported, Alternately Place Feet on Step/Stool: Able to stand independently and complete 8 steps >20 seconds Standing Unsupported, One Foot in Front: Able to place foot tandem independently and hold  30 seconds Standing on One Leg: Able to lift leg independently and hold 5-10 seconds Total Score: 52 Static Sitting Balance Static Sitting - Balance Support: No upper extremity supported;Feet supported Static Sitting - Level of Assistance: 6: Modified independent (Device/Increase time) Dynamic Sitting Balance Dynamic Sitting - Balance Support: During functional activity Dynamic Sitting - Level of Assistance: 6: Modified independent (Device/Increase time) Static Standing Balance Static Standing - Balance Support: During functional activity Static Standing - Level of Assistance: 6: Modified independent (Device/Increase time) Dynamic Standing Balance Dynamic Standing - Balance Support: During functional activity Dynamic Standing - Level of Assistance: 6: Modified independent (Device/Increase time) Extremity/Trunk Assessment RUE Assessment RUE Assessment:  (4/5 throughout; decreased gross grasp) LUE Assessment LUE Assessment: Within Functional Limits   See Function Navigator for Current Functional Status.  [name], [name] C 11/12/2016, 5:02 PM

## 2016-11-12 NOTE — Progress Notes (Signed)
Occupational Therapy Session Note  Patient Details  Name: Philip Howard MRN: [id number] Date of Birth: 1965-07-26  Today's Date: 11/12/2016 OT Individual Time: 2671-2458 OT Individual Time Calculation (min): 60 min    Short Term Goals: Week 1:  OT Short Term Goal 1 (Week 1): Pt will use R UE at stabilizer level during grooming task with min A OT Short Term Goal 2 (Week 1): Pt will bathe with supervision sit<> stand with supervision. OT Short Term Goal 3 (Week 1): Pt will complete 2 grooming tasks in standing with supervision OT Short Term Goal 4 (Week 1): Pt will ambulate within room with RW and close supervision  Skilled Therapeutic Interventions/Progress Updates:    Pt seen this session for RUE NMR with a focus on activities he can do at home.  Pt ambulated to gym and engaged in various exercises for coordinated movement patterns for R shoulder (small arm circles, hula hoop rotations), forearm  ( turning a pot to simulate pouring out water), wrist (turning objects over, and thumb strength. Pt is having difficulty with writing due to limited thumb strength and coordination. Pt given a written exercise sheet to take home today. Pt is now mod I in his room.  Therapy Documentation Precautions:  Precautions Precautions: Fall Restrictions Weight Bearing Restrictions: No    Pain: no c/o pain   ADL:   See Function Navigator for Current Functional Status.   Therapy/Group: Individual Therapy  [facility] 11/12/2016, 12:56 PM

## 2016-11-12 NOTE — Progress Notes (Signed)
Subjective/Complaints: Pt doing well. Up in room wihtout device. Anxious to go home  ROS: pt denies nausea, vomiting, diarrhea, cough, shortness of breath or chest pain  Objective: Vital Signs: Blood pressure 109/79, pulse 60, temperature 97.8 F (36.6 C), temperature source Oral, resp. rate 18, height _0  (1.651 m), SpO2 99 %. No results found. No results found for this or any previous visit (from the past 72 hour(s)).   HEENT: normal Cardio:  RRR Resp: CTA B without distress GI: BS positive and NT, ND Extremity:  No Edema Skin:   Intact Neuro: Alert/Oriented, Abnormal Sensory parasthesia but intact LT RUE and Abnormal Motor 3+ to 4/5 R delt , bi, tri, grip--decreased but functional [place]. ankle DF, 4/5 R HF, KE,  5/5 on Left  Musc/Skel:  minimal tenderness R trap and Left flank Gen NAD Gait improved with less right foot drop  Assessment/Plan: 1. Functional deficits secondary to Left BG, CR infarct which require 3+ hours per day of interdisciplinary therapy in a comprehensive inpatient rehab setting. Physiatrist is providing close team supervision and 24 hour management of active medical problems listed below. Physiatrist and rehab team continue to assess barriers to discharge/monitor patient progress toward functional and medical goals. FIM: Function - Bathing Position: Shower Body parts bathed by patient: Right arm, Left lower leg, Left arm, Back, Abdomen, Chest, Front perineal area, Left upper leg, Right lower leg, Right upper leg, Buttocks Body parts bathed by helper: Back Assist Level: More than reasonable time  Function- Upper Body Dressing/Undressing What is the patient wearing?: Pull over shirt/dress Pull over shirt/dress - Perfomed by patient: Thread/unthread right sleeve, Thread/unthread left sleeve, Put head through opening, Pull shirt over trunk Assist Level: More than reasonable time Set up : To obtain clothing/put away Function - Lower Body  Dressing/Undressing What is the patient wearing?: Pants, Socks, Shoes Position: Wheelchair/chair at sink Pants- Performed by patient: Thread/unthread right pants leg, Thread/unthread left pants leg, Pull pants up/down, Fasten/unfasten pants Socks - Performed by patient: Don/doff right sock, Don/doff left sock Socks - Performed by helper: Don/doff left sock Shoes - Performed by patient: Don/doff right shoe, Don/doff left shoe (Elastic shoelaces) Shoes - Performed by helper: Fasten right, Fasten left Assist for footwear: Independent Assist for lower body dressing: More than reasonable time Set up : To obtain clothing/put away  Function - Toileting Toileting steps completed by patient: Adjust clothing prior to toileting, Performs perineal hygiene, Adjust clothing after toileting Toileting Assistive Devices: Grab bar or rail Assist level: More than reasonable time  Function - Air cabin crew transfer assistive device: Grab bar Assist level to toilet: No Help, no cues, assistive device, takes more than a reasonable amount of time Assist level from toilet: No Help, no cues, assistive device, takes more than a reasonable amount of time  Function - Chair/bed transfer Chair/bed transfer method: Ambulatory Chair/bed transfer assist level: No Help, no cues, assistive device, takes more than a reasonable amount of time  Function - Locomotion: Wheelchair Will patient use wheelchair at discharge?: No Type: Manual Max wheelchair distance: 261f  Assist Level: Supervision or verbal cues Assist Level: Supervision or verbal cues Assist Level: Supervision or verbal cues Function - Locomotion: Ambulation Assistive device: No device Max distance: 150 ft  Assist level: No help, No cues, assistive device, takes more than a reasonable amount of time Assist level: No help, No cues, assistive device, takes more than a reasonable amount of time Assist level: No help, No cues, assistive device,  takes more  than a reasonable amount of time Walk 150 feet activity did not occur: Refused Assist level: No help, No cues, assistive device, takes more than a reasonable amount of time Assist level: Supervision or verbal cues  Function - Comprehension Comprehension: Auditory Comprehension assist level: Follows complex conversation/direction with no assist  Function - Expression Expression: Verbal Expression assist level: Expresses complex ideas: With no assist  Function - Social Interaction Social Interaction assist level: Interacts appropriately with others - No medications needed.  Function - Problem Solving Problem solving assist level: Solves complex problems: Recognizes & self-corrects  Function - Memory Memory assist level: Complete Independence: No helper Patient normally able to recall (first 3 days only): Current season, Location of own room, Staff names and faces, That he or she is in a hospital  Medical Problem List and Plan: 1. 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  -goals met, mod I 2. DVT Prophylaxis/Anticoagulation: Subcutaneous Lovenox. Monitor platelet counts, No signs of bleeding,  3. Pain Management: Robaxin schedule, pain related to muscle substitution  -improved pain levels 4. Mood: Provide emotional support 5. Neuropsych: This patient iscapable of making decisions on herown behalf. 6. Skin/Wound Care: Routine skin checks 7. Fluids/Electrolytes/Nutrition: potassium improved on Saturday  -push fluids  -f/u bmet pending 8.Hyperlipidemia. Lipitor 9.Polysubstance abuse. Counseling neuropsych,     LOS (Days) 7 A FACE TO FACE EVALUATION WAS PERFORMED  Haylei Cobin T 11/12/2016, 9:21 AM

## 2016-11-12 NOTE — Progress Notes (Signed)
Physical Therapy Discharge Summary  Patient Details  Name: Philip Howard MRN: [id number] Date of Birth: Jan 20, 1966  Today's Date: 11/12/2016    Patient has met 11 of 11 long term goals due to improved activity tolerance, improved balance, improved postural control, increased strength, improved awareness and improved coordination.  Patient to discharge at an ambulatory level Modified Independent.   Patient's care partner is independent to provide the necessary physical assistance at discharge.  Reasons goals not met: N/A  Recommendation:  Patient will benefit from ongoing skilled PT services in outpatient setting to continue to advance safe functional mobility, address ongoing impairments in balance, coordination, strength, safety and minimize fall risk.  Equipment: No equipment provided  Reasons for discharge: treatment goals met  Patient/family agrees with progress made and goals achieved: Yes  PT Discharge     Motor  Motor Motor: Hemiplegia Motor - Discharge Observations: R side Hemiplegia   Mobility Bed Mobility Bed Mobility: Rolling Right;Rolling Left;Sit to Supine;Supine to Sit Rolling Right: 6: Modified independent (Device/Increase time) Rolling Left: 6: Modified independent (Device/Increase time) Supine to Sit: 6: Modified independent (Device/Increase time) Sit to Supine: 6: Modified independent (Device/Increase time) Transfers Transfers: Yes Sit to Stand: 6: Modified independent (Device/Increase time) Stand Pivot Transfers: 6: Modified independent (Device/Increase time) Locomotion  Ambulation Ambulation: Yes Ambulation/Gait Assistance: 6: Modified independent (Device/Increase time) Ambulation Distance (Feet): 500 Feet Assistive device: None Gait Gait Pattern: Impaired Gait Pattern: Step-through pattern;Decreased step length - right;Decreased dorsiflexion - right;Decreased hip/knee flexion - right;Right foot flat;Lateral trunk lean to left Stairs /  Additional Locomotion Stairs: Yes Stairs Assistance: 5: Supervision Stair Management Technique: One rail Left Number of Stairs: 16 Height of Stairs: 6 Ramp: 7: Independent Curb: 6: Modified independent (Device/increase time)  Trunk/Postural Assessment  Cervical Assessment Cervical Assessment: Within Functional Limits Thoracic Assessment Thoracic Assessment: Within Functional Limits Lumbar Assessment Lumbar Assessment: Exceptions to [facility] (posterior pelvic tilt) Postural Control Postural Control: Deficits on evaluation  Balance Balance Balance Assessed: Yes Standardized Balance Assessment Standardized Balance Assessment: Berg Balance Test Berg Balance Test Sit to Stand: Able to stand without using hands and stabilize independently Standing Unsupported: Able to stand safely 2 minutes Sitting with Back Unsupported but Feet Supported on Floor or Stool: Able to sit safely and securely 2 minutes Stand to Sit: Sits safely with minimal use of hands Transfers: Able to transfer safely, minor use of hands Standing Unsupported with Eyes Closed: Able to stand 10 seconds safely Standing Ubsupported with Feet Together: Able to place feet together independently and stand 1 minute safely From Standing, Reach Forward with Outstretched Arm: Can reach confidently >25 cm (10") From Standing Position, Pick up Object from Floor: Able to pick up shoe safely and easily From Standing Position, Turn to Look Behind Over each Shoulder: Looks behind one side only/other side shows less weight shift Turn 360 Degrees: Able to turn 360 degrees safely one side only in 4 seconds or less Standing Unsupported, Alternately Place Feet on Step/Stool: Able to stand independently and complete 8 steps >20 seconds Standing Unsupported, One Foot in Front: Able to place foot tandem independently and hold 30 seconds Standing on One Leg: Able to lift leg independently and hold 5-10 seconds Total Score: 52 Static Sitting  Balance Static Sitting - Balance Support: No upper extremity supported;Feet supported Static Sitting - Level of Assistance: 6: Modified independent (Device/Increase time) Dynamic Sitting Balance Dynamic Sitting - Balance Support: During functional activity Dynamic Sitting - Level of Assistance: 6: Modified independent (Device/Increase time) Static Standing  Balance Static Standing - Balance Support: During functional activity;Right upper extremity supported;Left upper extremity supported Static Standing - Level of Assistance: 6: Modified independent (Device/Increase time) Dynamic Standing Balance Dynamic Standing - Balance Support: During functional activity Dynamic Standing - Level of Assistance: 6: Modified independent (Device/Increase time) Extremity Assessment      RLE Assessment RLE Assessment: Exceptions to [facility] RLE Strength RLE Overall Strength Comments: Grossly 4+/5 LLE Assessment LLE Assessment: Within Functional Limits   See Function Navigator for Current Functional Status.  Rosita DeChalus  Rosita DeChalus, PTA  11/12/2016, 4:37 PM

## 2016-11-12 NOTE — Progress Notes (Signed)
Physical Therapy Session Note  Patient Details  Name: Philip Howard MRN: [id number] Date of Birth: [birth year]  Today's Date: 11/12/2016 PT Individual Time: 1000-1100 PT Individual Time Calculation (min): 60 min   Short Term Goals: Week 1:  PT Short Term Goal 1 (Week 1): Pt will ambulate 168f with supervision assist from PT PT Short Term Goal 2 (Week 1): Pt will perform bed mobility without cues or assist from PT PT Short Term Goal 3 (Week 1): Pt will perform stand pivot and sit<>stand transfers with Supervision assist consistently PT Short Term Goal 4 (Week 1): Pt will ascend 4 steps with Supervision assist and 1-2 UE support for improved access to the community.   Skilled Therapeutic Interventions/Progress Updates:  Pt presented in room with nsg present agreeable to therapy. Pt ambulated to ortho gym no AD with pt demonstrating good awareness of decreased compensatory patterns when advancing RLE. Pt participated in functional activities including car transfer, gait on compliant surface, picking up objects, and bed mobility all performed at mod I level. Pt ascend/descended x 12 steps with use of 1 rail both with step to pattern and reciprocal ascending and step to pattern descending. Pt performed fall recover both with use of furniture and without both performed mod I. Pt reviewed [place] exercises x 10 ea and min cues. Pt ambulated throughout unit >5045fmod I and returned to room with all current needs met.      Therapy Documentation Precautions:  Precautions Precautions: Fall Restrictions Weight Bearing Restrictions: No  See Function Navigator for Current Functional Status.   Therapy/Group: Individual Therapy  Tyqwan Pink  Mayo Faulk, PTA  11/12/2016, 4:27 PM

## 2016-11-12 NOTE — Discharge Instructions (Signed)
Inpatient Rehab Discharge Instructions  Philip Howard Discharge date and time: No discharge date for patient encounter.   Activities/Precautions/ Functional Status: Activity: activity as tolerated Diet: regular diet Wound Care: none needed Functional status:  ___ No restrictions     ___ Walk up steps independently ___ 24/7 supervision/assistance   ___ Walk up steps with assistance ___ Intermittent supervision/assistance  ___ Bathe/dress independently ___ Walk with walker     _x__ Bathe/dress with assistance ___ Walk Independently    ___ Shower independently ___ Walk with assistance    ___ Shower with assistance ___ No alcohol     ___ Return to work/school ________  Special Instructions: No driving smoking or illicit drug use or alcohol   COMMUNITY REFERRALS UPON DISCHARGE:   None:NO FOLLOW UP RECOMMENDED  Medical Equipment/Items Ordered:NO NEEDS   Other:DECLINED SUBSTANCE ABUSE RESOURCES FEELS CAN QUIT ON HIS OWN  GENERAL COMMUNITY RESOURCES FOR PATIENT/FAMILY: Support Groups:[facility] SUPPORT GROUP EVERY SECOND Thursday ( SEPT-MAY) ON THE REHAB UNIT @ 3:00-4:00 PM  QUESTIONS CONTACT CAITLYN [id number]  STROKE/TIA DISCHARGE INSTRUCTIONS SMOKING Cigarette smoking nearly doubles your risk of having a stroke & is the single most alterable risk factor  If you smoke or have smoked in the last 12 months, you are advised to quit smoking for your health.  Most of the excess cardiovascular risk related to smoking disappears within a year of stopping.  Ask you doctor about anti-smoking medications  Philip Howard Quit Line: 1-800-QUIT NOW  Free Smoking Cessation Classes [phone]  CHOLESTEROL Know your levels; limit fat & cholesterol in your diet  Lipid Panel     Component Value Date/Time   CHOL 283 (H) 11/04/2016 0349   TRIG 388 (H) 11/04/2016 0349   HDL 39 (L) 11/04/2016 0349   CHOLHDL 7.3 11/04/2016 0349   VLDL 78 (H) 11/04/2016 0349   LDLCALC 166 (H) 11/04/2016 0349       Many patients benefit from treatment even if their cholesterol is at goal.  Goal: Total Cholesterol (CHOL) less than 160  Goal:  Triglycerides (TRIG) less than 150  Goal:  HDL greater than 40  Goal:  LDL (LDLCALC) less than 100   BLOOD PRESSURE American Stroke Association blood pressure target is less that 120/80 mm/Hg  Your discharge blood pressure is:  BP: 109/79  Monitor your blood pressure  Limit your salt and alcohol intake  Many individuals will require more than one medication for high blood pressure  DIABETES (A1c is a blood sugar average for last 3 months) Goal HGBA1c is under 7% (HBGA1c is blood sugar average for last 3 months)  Diabetes: No known diagnosis of diabetes    Lab Results  Component Value Date   HGBA1C 5.7 (H) 11/03/2016     Your HGBA1c can be lowered with medications, healthy diet, and exercise.  Check your blood sugar as directed by your physician  Call your physician if you experience unexplained or low blood sugars.  PHYSICAL ACTIVITY/REHABILITATION Goal is 30 minutes at least 4 days per week  Activity: Increase activity slowly, Therapies: Physical Therapy: Home Health Return to work:   Activity decreases your risk of heart attack and stroke and makes your heart stronger.  It helps control your weight and blood pressure; helps you relax and can improve your mood.  Participate in a regular exercise program.  Talk with your doctor about the best form of exercise for you (dancing, walking, swimming, cycling).  DIET/WEIGHT Goal is to maintain a healthy weight  Your discharge  diet is: Diet Heart Room service appropriate? Yes; Fluid consistency: Thin  liquids Your height is:  Height: 5\' 5"  (165.1 cm) Your current weight is:   Your Body Mass Index (BMI) is:     Following the type of diet specifically designed for you will help prevent another stroke.  Your goal weight range is:    Your goal Body Mass Index (BMI) is 19-24.  Healthy food habits  can help reduce 3 risk factors for stroke:  High cholesterol, hypertension, and excess weight.  RESOURCES Stroke/Support Group:  Call [phone]   STROKE EDUCATION PROVIDED/REVIEWED AND GIVEN TO PATIENT Stroke warning signs and symptoms How to activate emergency medical system (call 911). Medications prescribed at discharge. Need for follow-up after discharge. Personal risk factors for stroke. Pneumonia vaccine given:  Flu vaccine given:  My questions have been answered, the writing is legible, and I understand these instructions.  I will adhere to these goals & educational materials that have been provided to me after my discharge from the hospital.      My questions have been answered and I understand these instructions. I will adhere to these goals and the provided educational materials after my discharge from the hospital.  Patient/Caregiver Signature _______________________________ Date __________  Clinician Signature _______________________________________ Date __________  Please bring this form and your medication list with you to all your follow-up doctor's appointments.

## 2016-11-13 ENCOUNTER — Telehealth: Payer: Self-pay

## 2016-11-13 NOTE — Telephone Encounter (Signed)
Transitional Care call  Patient name: Philip Howard) DOB: (1965-07-03) 1. Are you/is patient experiencing any problems since coming home? (NO) a. Are there any questions regarding any aspect of care? (NO) 2. Are there any questions regarding medications administration/dosing? (NO) a. Are meds being taken as prescribed? (YES) b. "Patient should review meds with caller to confirm"  3. Have there been any falls? (NO) 4. Has Home Health been to the house and/or have they contacted you? (YES) a. If not, have you tried to contact them? (NO) b. Can we help you contact them? (NO) 5. Are bowels and bladder emptying properly? (YES) a. Are there any unexpected incontinence issues? (NO) b. If applicable, is patient following bowel/bladder programs? (NA) 6. Any fevers, problems with breathing, unexpected pain? (NUMBNESS ON SIDE OF FACE / FINGERTIPS, HEADACHE) 7. Are there any skin problems or new areas of breakdown? (NO) 8. Has the patient/family member arranged specialty MD follow up (ie cardiology/neurology/renal/surgical/etc.)?  (YES) a. Can we help arrange? (NO) 9. Does the patient need any other services or support that we can help arrange? (NO) 10. Are caregivers following through as expected in assisting the patient? (YES) 11. Has the patient quit smoking, drinking alcohol, or using drugs as recommended? (YES)  Appointment date/time (11-19-2016 / 1030AM), arrive time (1010AM), and who it is with here (DR. Letta Pate) [place]

## 2016-11-19 ENCOUNTER — Encounter: Payer: Self-pay | Attending: Physical Medicine & Rehabilitation

## 2016-11-19 ENCOUNTER — Ambulatory Visit (HOSPITAL_BASED_OUTPATIENT_CLINIC_OR_DEPARTMENT_OTHER): Payer: Self-pay | Admitting: Physical Medicine & Rehabilitation

## 2016-11-19 ENCOUNTER — Encounter: Payer: Self-pay | Admitting: Physical Medicine & Rehabilitation

## 2016-11-19 VITALS — BP 112/70 | HR 67

## 2016-11-19 DIAGNOSIS — R292 Abnormal reflex: Secondary | ICD-10-CM | POA: Insufficient documentation

## 2016-11-19 DIAGNOSIS — I69398 Other sequelae of cerebral infarction: Secondary | ICD-10-CM

## 2016-11-19 DIAGNOSIS — I69351 Hemiplegia and hemiparesis following cerebral infarction affecting right dominant side: Secondary | ICD-10-CM | POA: Insufficient documentation

## 2016-11-19 DIAGNOSIS — E785 Hyperlipidemia, unspecified: Secondary | ICD-10-CM | POA: Insufficient documentation

## 2016-11-19 DIAGNOSIS — G8191 Hemiplegia, unspecified affecting right dominant side: Secondary | ICD-10-CM

## 2016-11-19 DIAGNOSIS — F17200 Nicotine dependence, unspecified, uncomplicated: Secondary | ICD-10-CM | POA: Insufficient documentation

## 2016-11-19 DIAGNOSIS — I69314 Frontal lobe and executive function deficit following cerebral infarction: Secondary | ICD-10-CM | POA: Insufficient documentation

## 2016-11-19 DIAGNOSIS — R269 Unspecified abnormalities of gait and mobility: Secondary | ICD-10-CM

## 2016-11-19 DIAGNOSIS — F1410 Cocaine abuse, uncomplicated: Secondary | ICD-10-CM | POA: Insufficient documentation

## 2016-11-19 DIAGNOSIS — Z5189 Encounter for other specified aftercare: Secondary | ICD-10-CM | POA: Insufficient documentation

## 2016-11-19 NOTE — Progress Notes (Signed)
Subjective:  Transitional care visit after rehabilitation hospitalization  Patient ID: Philip Howard, male    DOB: 30-Mar-1966, 51 y.o.   MRN: [id number] 51 year old, right-handed male, history of tobacco, polysubstance abuse, on no prescription medication, who lives with girlfriend, independent prior to admission.  Presented on Nov 02, 2016, with right-sided weakness.  Urine drug screen, positive cocaine, marijuana.  CT reviewed, showing left basal ganglia infarct. MRI, acute/subacute nonhemorrhagic infarct involving the posterior limb of the internal capsule.  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  CTA of the head showed no emergent large vessel occlusion or stenosis.  Echocardiogram with ejection fraction of 40%, grade 1 diastolic dysfunction.  The patient did not receive tPA.  DATE OF ADMISSION:  11/05/2016 DATE OF DISCHARGE:  11/12/2016  HPI Patient living at home, modified independent with all self-care and mobility, but continues to have weakness on the right side. He walks more slowly than usual. He denies using marijuana or cocaine or smoking cigarettes. He has not had any outpatient therapy or home health. Thus far. He does not have a primary care provider, use to see Dr. Nyoka Cowden, who has retired Pain Inventory Average Pain 7 Pain Right Now 6 My pain is tingling and aching  In the last 24 hours, has pain interfered with the following? General activity 5 Relation with others 10 Enjoyment of life 10 What TIME of day is your pain at its worst? morning Sleep (in general) Fair  Pain is worse with: walking and standing Pain improves with: rest Relief from Meds: 6  Mobility walk without assistance ability to climb steps?  yes  Function not employed: date last employed 11-02-16  Neuro/Psych weakness dizziness  Prior Studies Any changes since last visit?  no  Physicians involved in your care Any  changes since last visit?  no   No family history on file. Social History   Social History  . Marital status: Single    Spouse name: N/A  . Number of children: N/A  . Years of education: N/A   Social History Main Topics  . Smoking status: Current Every Day Smoker    Packs/day: 0.50    Types: Cigarettes  . Smokeless tobacco: Never Used  . Alcohol use Yes  . Drug use: Yes    Types: Marijuana  . Sexual activity: Yes   Other Topics Concern  . Not on file   Social History Narrative  . No narrative on file   Past Surgical History:  Procedure Laterality Date  . arthroscopic surgery     No past medical history on file. There were no vitals taken for this visit.  Opioid Risk Score:   Fall Risk Score:  `1  Depression screen PHQ 2/9  No flowsheet data found.   Review of Systems  Constitutional: Positive for diaphoresis.  HENT: Negative.   Eyes: Negative.   Respiratory: Negative.   Cardiovascular: Negative.   Gastrointestinal: Positive for nausea.  Endocrine: Negative.   Genitourinary: Negative.   Musculoskeletal: Negative.   Skin: Negative.   Allergic/Immunologic: Negative.   Neurological: Negative.   Hematological: Negative.   Psychiatric/Behavioral: Negative.   All other systems reviewed and are negative.      Objective:   Physical Exam  Constitutional: He is oriented to person, place, and time. He appears well-developed and well-nourished.  HENT:  Head: Normocephalic and atraumatic.  Eyes: Conjunctivae and EOM are normal. Pupils are equal, round, and reactive to light.  Cardiovascular: Normal rate, regular rhythm and normal heart sounds.   No murmur heard. Pulmonary/Chest: Effort normal and breath sounds normal. No respiratory distress. He has no wheezes.  Abdominal: Soft. Bowel sounds are normal. He exhibits no distension. There is no tenderness.  Musculoskeletal:       Right shoulder: He exhibits decreased range of motion and pain.  Right shoulder  has decreased endrange, internal and external rotation. Positive impingement sign. No swelling in the left or right upper extremity.   Neurological: He is alert and oriented to person, place, and time. He displays no tremor. A sensory deficit is present. Coordination and gait abnormal.  Motor strength is 4 minus in the right deltoid, bicep, tricep, finger flexors and extensors. 4 minus in the right hip flexor, knee extensor and 3 minus in the right ankle dorsiflexor.  Ambulates with a mild right foot drag.  Decreased right upper extremity fine motor coordination  Decreased sensation right side of face  Skin: Skin is warm and dry.  Psychiatric: He has a normal mood and affect.  Nursing note and vitals reviewed.         Assessment & Plan:  1. 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 He has made good functional recovery, however, still has decreased fine motor as well as right lower extremity gross motor weakness. I do not think he is ready for driving yet, but should be in about 6 weeks. Referral to outpatient therapy. PT, OT.  2. Stroke risk factors, hyperlipidemia as well as cocaine abuse. He states that he was not a regular cocaine user. He was a one pack per day smoker and has now quit. Continue Lipitor. Continued to advise against smoking as well as cocaine abuse  Will need PCP referral to community health and wellness  Has neurology appointment June 26. Reinforced the need to attend this  Physical medicine rehabilitation follow-up in 6 weeks

## 2016-11-19 NOTE — Patient Instructions (Signed)
PT should call you if they can see you without insurance  Keep appt with Neurology June 26  I have made referral to [facility] they should call you for a primary MD visit

## 2016-11-21 ENCOUNTER — Ambulatory Visit (HOSPITAL_COMMUNITY): Payer: Self-pay | Attending: Physical Medicine & Rehabilitation | Admitting: Physical Therapy

## 2016-11-21 DIAGNOSIS — R2689 Other abnormalities of gait and mobility: Secondary | ICD-10-CM | POA: Insufficient documentation

## 2016-11-21 DIAGNOSIS — M6281 Muscle weakness (generalized): Secondary | ICD-10-CM | POA: Insufficient documentation

## 2016-11-21 DIAGNOSIS — R29898 Other symptoms and signs involving the musculoskeletal system: Secondary | ICD-10-CM | POA: Insufficient documentation

## 2016-11-21 DIAGNOSIS — R2681 Unsteadiness on feet: Secondary | ICD-10-CM | POA: Insufficient documentation

## 2016-11-21 DIAGNOSIS — R278 Other lack of coordination: Secondary | ICD-10-CM | POA: Insufficient documentation

## 2016-11-21 NOTE — Patient Instructions (Signed)
LUNGE FORWARD - BOX (OR STEP- YOU DO NOT NEED TO GO AS LOW AS THE FELLOW IN THIS PICTURE)  While standing on the ground with a step in front of you, place your foot foward and onto a step as shown.    Allow your front and back knees to bend as you lower your back knee towards the ground into a lunge position. Do not allow your front knee to pass your toes.   Repeat 10-15 times with your right leg, 2 times per day.    TOES RAISES - DORSIFLEXION STANDING  In a standing position with your feet on the ground, raise up your forefoot and toes as you bend at your ankle.   Focus on your right foot.   Repeat 15 times, 2 times a day.    Step-ups  Start facing stairs (holding on the railing for safety). Place one foot on the step (without pulling yourself up using the railing) step up onto the first step. Be careful not to hop up onto the step, but to use the leg to do a controlled step-up.  Focus on your right leg.  Repeat 15 times, 2 times a day.    SQUAT - CHAIR AS GUIDE  While standing with feet shoulder width apart and in front of a chair that is facing you, bend your knees and lower your body towards the floor. The chair seat is a guide so that your knees do not pass over your toes.   Your body weight should mostly be directed through the heels of your feet. Return to a standing position.   Knees should bend in line with the 2nd toe and not pass beyond the toes.  Repeat 10-15 times, 2 times a day.     SIT TO STAND - NO SUPPORT  Start by scooting close to the front of the chair.  Next, lean forward at your trunk and reach forward with your arms and rise to standing without using your hands to push off from the chair or other object.   Use your arms as a counter-balance by reaching forward when in sitting and lower them as you approach standing.   You can clasp your hands and put them on the outside of your right leg before you stand to help shift your weight to the  right.  Repeat 10-15 times, twice a day making sure your weight is shifted to the R.     SINGLE LEG STANCE - SLS  Stand on one leg and maintain your balance.  Hold as long as you can.  Repeat 3 times on your right side, 3 times a day.    TANDEM STANCE WITH SUPPORT  Stand in front of a chair, table or counter top for support. Then place the heel of one foot so that it is touching the toes of the other foot. Maintain your balance in this position.   Hold as long as you can.  Repeat 3 times each side, 2 times a day.    Toe Taps on Step  Pick one foot off of the ground and tap it on a step. Lower you foot back to the ground and tap the step with your other foot. Repeat the exercise switching back and forth between both legs.  Perform this as fast as you can but SAFELY- if you are going so fast that you almost fall, you are moving too fast- but you should still feel challenged.  Repeat for 3 minutes straight,  2 times per day.    TANDEM WALK (DO THIS IN YOUR HALLWAY FOR SAFETY)  Stand with one foot directly in front of the other so that the toes of one foot touches the heel of the other as shown in the image.   Progress by taking steps with your heel touching your toes with each step as shown in video.    Maintain your balance.   Repeat 3-4 down and back laps of the hallway, twice a day.     RETRO GAIT (DO THIS IN HALLWAY FOR SAFETY)  Walk backwards. As you take steps backward, leading with the toe touching the floor first and then rolling to a flat foot as shown.  Repeat 3-4 down and back laps of your hallway, twice a day.

## 2016-11-21 NOTE — Therapy (Signed)
[place], [place], [address] Phone: [phone]   Fax:  [phone]  Physical Therapy Evaluation  Patient Details  Name: Philip Howard MRN: [id number] Date of Birth: Feb 05, 1966 Referring Provider: Alysia Penna   Encounter Date: 11/21/2016      PT End of Session - 11/21/16 1205    Visit Number 1   Number of Visits 2   Date for PT Re-Evaluation 12/05/16   Authorization Type No insurance/self-pay (medicaid potential)   Authorization Time Period 11/21/16 to 12/21/16   PT Start Time 1112   PT Stop Time 1157   PT Time Calculation (min) 45 min   Activity Tolerance Patient tolerated treatment well   Behavior During Therapy [facility] for tasks assessed/performed      No past medical history on file.  Past Surgical History:  Procedure Laterality Date  . arthroscopic surgery      There were no vitals filed for this visit.       Subjective Assessment - 11/21/16 1113    Subjective Patient had a CVA on the 18th of May, he first went to [facility] and then went to [facility] where he was in inpatient therapy for 1 week.  The hardest thing for him to do is use his UE to tie his shoes and do fine motor tasks, also soreness and numbness with his R UE. He does note some mild balance deficits R leg, it is still a little weak but is getting back.    Pertinent History vertebral artery occlusion, hx CVA, hx arthroscopic surgery knee    Patient Stated Goals get back to werhe he was    Currently in Pain? Yes   Pain Score 6    Pain Location Other (Comment)  R UE and side of head    Pain Orientation Right   Pain Descriptors / Indicators Sore;Tingling;Numbness   Pain Type Acute pain   Pain Radiating Towards just R UE/head    Pain Onset 1 to 4 weeks ago   Pain Frequency Constant   Aggravating Factors  nothing getting better in general    Pain Relieving Factors nothing getting better general    Effect of Pain on Daily  Activities moderate limtiation but is working on it             [facility] PT Assessment - 11/21/16 0001      Assessment   Medical Diagnosis R hemiparesis    Referring Provider Alysia Penna    Onset Date/Surgical Date 11/01/16   Next MD Visit Dr. Letta Pate in July    Prior Therapy CIR at [facility]      Precautions   Precautions None     Balance Screen   Has the patient fallen in the past 6 months No   Has the patient had a decrease in activity level because of a fear of falling?  No   Is the patient reluctant to leave their home because of a fear of falling?  No     Prior Function   Level of Independence Independent;Independent with basic ADLs;Independent with gait;Independent with transfers   Vocation Full time employment   Vocation Requirements [employer]    Leisure gardening, fishing, getting outside      Strength   Right Hip Flexion 5/5   Right Hip Extension 4+/5   Right Hip ABduction 4+/5   Left Hip Flexion 5/5   Left Hip Extension 4+/5   Left Hip ABduction 4+/5  Right Knee Flexion 5/5   Right Knee Extension 5/5   Left Knee Flexion 5/5   Left Knee Extension 5/5   Right Ankle Dorsiflexion 5/5   Left Ankle Dorsiflexion 5/5     Ambulation/Gait   Gait Comments occasional R toe drop, general R LE weakness, reduced gait speed      Standardized Balance Assessment   Standardized Balance Assessment Dynamic Gait Index     Dynamic Gait Index   Level Surface Mild Impairment   Change in Gait Speed Normal   Gait with Horizontal Head Turns Normal   Gait with Vertical Head Turns Normal   Gait and Pivot Turn Mild Impairment   Step Over Obstacle Mild Impairment   Step Around Obstacles Normal   Steps Mild Impairment   Total Score 20            Objective measurements completed on examination: See above findings.          [facility] Adult PT Treatment/Exercise - 11/21/16 0001      Exercises   Exercises Knee/Hip     Knee/Hip Exercises: Standing    Heel Raises Both;1 set;15 reps   Heel Raises Limitations toe raises    Forward Lunges Right;1 set;15 reps   Forward Lunges Limitations 4 inch box    Forward Step Up Right;1 set;15 reps   Forward Step Up Limitations 4 inch step    Functional Squat 1 set;15 reps   Other Standing Knee Exercises sit to stand weight to R 1x15              Balance Exercises - 11/21/16 1206      Balance Exercises: Standing   Tandem Stance 1 rep;15 secs   SLS Eyes open;3 reps;10 secs   Tandem Gait Forward;1 rep  68ft    Retro Gait 1 rep  54ft    Other Standing Exercises toe taps 4 inch step cues for reciprocal pattern and speed            PT Education - 11/21/16 1204    Education provided Yes   Education Details insurance limitations (medicaid potential, will be self pay until Medicaid is active); extensive HEP; prognosis; strongly encouraged general exercise program at [facility]; POC    Person(s) Educated Patient   Methods Explanation;Demonstration;Handout   Comprehension Verbalized understanding;Returned demonstration;Need further instruction          PT Short Term Goals - 11/21/16 1212      PT SHORT TERM GOAL #1   Title Patient to be educated on and independent with appropriate strength/balance extensive HEP for targeted exercise and self-progression to assist in recovery of CVA    Time 1   Period Days   Status Achieved           PT Long Term Goals - 11/21/16 1212      PT LONG TERM GOAL #1   Title N/A- no long term goals appropriate at this time                 Plan - 11/21/16 1207    Clinical Impression Statement Patient arrives after experiencing an acute CVA in mid-May; he received rehabilitation at [facility] inpatient and has since been working on his own to improve. He is aware that his Medicaid application is still in process and is willing to continue with this evaluation on a self-pay basis. Examination reveals mild R LE weakness, mild dynamic balance deficit,  and mild gait deviation. Patient is very motivated and was  assigned extensive strength and balance HEP to assist in addressing functional deficits. Due to insurance limitations, patient agreeable to working on this extensive home program and is to return in 2 weeks for follow-up with PT/progression of HEP if needed. If Medicaid is approved/activated while in our care we will gladly provide patient with more regular and extensive skilled PT sessions moving forward.   History and Personal Factors relevant to plan of care: history of tobacco use, highly motivated   Clinical Presentation Stable   Clinical Presentation due to: acute CVA    Clinical Decision Making Low   Rehab Potential Excellent   Clinical Impairments Affecting Rehab Potential (+) very motivated to actively participate in recovery from CVA    PT Frequency Other (comment)  1 more skilled session in 2 weeks for follow up    PT Duration Other (comment)  1 more skilled session in 2 weeks for follow-up    PT Treatment/Interventions ADLs/Self Care Home Management;Biofeedback;DME Instruction;Gait training;Stair training;Functional mobility training;Therapeutic activities;Therapeutic exercise;Balance training;Neuromuscular re-education;Patient/family education;Manual techniques;Energy conservation   PT Next Visit Plan repeat DGI/strength tests to determine if patietn needs progression or if he is OK for DC    PT Home Exercise Plan Eval: lunges, squats, sit to stand with weight to R, standing ankle DF, step ups, SLS, tandem stance, toe taps, tandem gait, retrogait    Recommended Other Services skilled OT services    Consulted and Agree with Plan of Care Patient      Patient will benefit from skilled therapeutic intervention in order to improve the following deficits and impairments:  Abnormal gait, Decreased coordination, Decreased activity tolerance, Decreased strength, Decreased balance, Difficulty walking  Visit Diagnosis: Muscle weakness  (generalized) - Plan: PT plan of care cert/re-cert  Other abnormalities of gait and mobility - Plan: PT plan of care cert/re-cert  Unsteadiness on feet - Plan: PT plan of care cert/re-cert     Problem List Patient Active Problem List   Diagnosis Date Noted  . Thrombotic cerebral infarction ([place]) 11/05/2016  . Weakness of extremity   . Cocaine abuse   . Chronic diastolic congestive heart failure ([place])   . Smoker   . Alcohol abuse   . Hyperlipidemia   . Acute ischemic stroke ([place]) 11/03/2016  . Numbness on right side 11/02/2016  . Hypokalemia 11/02/2016  . Substance abuse 11/02/2016  . 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 Stroke [facility]) 11/02/2016    Deniece Ree PT, DPT [id number]  [place] [address] [place], [place], [address] Phone: [phone]   Fax:  [phone]  Name: Philip Howard MRN: [id number] Date of Birth: [birth year]

## 2016-12-03 ENCOUNTER — Encounter (HOSPITAL_COMMUNITY): Payer: Self-pay

## 2016-12-03 ENCOUNTER — Ambulatory Visit (HOSPITAL_COMMUNITY): Payer: Self-pay

## 2016-12-03 DIAGNOSIS — R29898 Other symptoms and signs involving the musculoskeletal system: Secondary | ICD-10-CM

## 2016-12-03 DIAGNOSIS — R278 Other lack of coordination: Secondary | ICD-10-CM

## 2016-12-03 NOTE — Patient Instructions (Addendum)
Home Exercises Program Theraputty Exercises  Do the following exercises 2-3 times a day using your affected hand.  1. Roll putty into a ball.  2. Make into a pancake.  3. Roll putty into a roll.  4. Pinch along log with first finger and thumb.   5. Make into a ball.  6. Roll it back into a log.   7. Pinch using thumb and side of first finger.  8. Roll into a ball, then flatten into a pancake.  9. Using your fingers, make putty into a mountain.  10. Roll into a ball and squeeze putty 10-15 times.    Coordination Activities  Perform the following activities for 10 minutes 2 times per day with right hand(s).   Rotate ball in fingertips (clockwise and counter-clockwise).  Flip cards 1 at a time as fast as you can.  Deal cards with your thumb (Hold deck in hand and push card off top with thumb).  Shuffle cards.  Pick up coins and stack.  Pick up coins one at a time until you get 5-10 in your hand, then move coins from palm to fingertips to stack one at a time.

## 2016-12-03 NOTE — Therapy (Signed)
[place], [place], [address] Phone: [phone]   Fax:  [phone]  Occupational Therapy Evaluation  Patient Details  Name: Philip Howard MRN: [id number] Date of Birth: 01/04/1966 Referring Provider: Lynford Citizen MD  Encounter Date: 12/03/2016      OT End of Session - 12/03/16 1728    Visit Number 1   Number of Visits 8   Date for OT Re-Evaluation 12/31/16   Authorization Type Medicaid potential - Pt is self pay and is aware   OT Start Time 1435   OT Stop Time 1509   OT Time Calculation (min) 34 min   Activity Tolerance Patient tolerated treatment well   Behavior During Therapy [facility] for tasks assessed/performed      History reviewed. No pertinent past medical history.  Past Surgical History:  Procedure Laterality Date  . arthroscopic surgery      There were no vitals filed for this visit.      Subjective Assessment - 12/03/16 1511    Subjective  S: I just want to get better.   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 Pt reports this happened on 11/02/16.  Pt was referred to therapy by Dr. Lynford Citizen for eval and treat. Pt received prior therapy in acute care at [facility]   Patient Stated Goals Pt reports he would like to get back to 100% and be able to use his arm fully again.   Currently in Pain? No/denies           [facility] OT Assessment - 12/03/16 1437      Assessment   Diagnosis Left Basal Ganglia Infarct   Referring Provider Lynford Citizen MD   Onset Date 11/02/16   Assessment Follow up with Dr. 12/31/16   Prior Therapy 7 days inpatient at [facility]     Precautions   Precautions None   Precaution Comments --  No driving/lifting heavy things     Balance Screen   Has the patient fallen in the past 6 months No     Home  Environment   Family/patient expects to be discharged  to: Private residence   Lives With Significant other;Son     Prior Function   Level of Independence Independent   Vocation Full time employment   Vocation Requirements [employer]    Leisure gardening, fishing, getting outside      ADL   ADL comments Pt reports having trouble tying his shoes and cutting with a knife., he is overall slower using his right hand/arm.     Mobility   Mobility Status Independent     Written Expression   Dominant Hand Right     Vision - History   Baseline Vision No visual deficits     Cognition   Overall Cognitive Status Within Functional Limits for tasks assessed     Coordination   Gross Motor Movements are Fluid and Coordinated Yes   Fine Motor Movements are Fluid and Coordinated No   9 Hole Peg Test Right;Left   Right 9 Hole Peg Test 27.98   Left 9 Hole Peg Test 17.55   Box and Blocks right: 57, left: 64     ROM / Strength   AROM / PROM / Strength AROM;PROM;Strength     AROM   Overall AROM  Within functional limits for tasks performed     Strength  Overall Strength Deficits   Strength Assessment Site Shoulder   Right/Left Shoulder Right;Left   Right Shoulder Flexion 4+/5   Right Shoulder ABduction 3+/5   Right Shoulder Internal Rotation 5/5   Right Shoulder External Rotation 3+/5   Left Shoulder Flexion 5/5   Left Shoulder ABduction 5/5   Left Shoulder Internal Rotation 5/5   Left Shoulder External Rotation 5/5     Hand Function   Right Hand Gross Grasp Impaired   Right Hand Grip (lbs) 43   Right Hand Lateral Pinch 18 lbs   Right Hand 3 Point Pinch 12 lbs   Left Hand Gross Grasp Functional   Left Hand Grip (lbs) 95   Left Hand Lateral Pinch 20 lbs   Left 3 point pinch 17 lbs                         OT Education - 12/03/16 1510    Education provided Yes   Education Details Pt educated on HEP for grip and pinch strengthening and coordination, discussed need for therapy with patient   Person(s)  Educated Patient   Methods Explanation;Demonstration;Verbal cues;Handout   Comprehension Verbalized understanding;Returned demonstration          OT Short Term Goals - 12/03/16 1739      OT SHORT TERM GOAL #1   Title Pt will be educated and independent with HEP to increase RUE use in daily activities.   Time 4   Period Weeks   Status New     OT SHORT TERM GOAL #2   Title Pt will increase RUE strength to 4+/5 with abduction and er to increase ability to use RUE as dominant with gardening and other outdoor activities.   Time 4   Period Weeks   Status New     OT SHORT TERM GOAL #3   Title Pt will increase pinch strength by 2# to increase ability to complete tying shoes with less difficulty.   Time 4   Period Weeks   Status New     OT SHORT TERM GOAL #4   Title Pt will increase grip strength by 15# to increase ability to return to completing leisure tasks such as fishing.   Time 4   Period Weeks   Status New                  Plan - 12/03/16 1732    Clinical Impression Statement A: Pt is a 51 year old male S/P left basal ganglia infarct causing right side weakness, decreased grip, pinch and shoulder strength. These deficits are impacting his ability to perform daily tasks. Pt would benefit from skilled occupational therapy services to address overall shoulder strength, pinch and grip strength.   Occupational Profile and client history currently impacting functional performance Motivated to return to normal functioning, wants to return to work   Occupational performance deficits (Please refer to evaluation for details): ADL's;IADL's;Work;Rest and Sleep;Leisure;Social Participation   Rehab Potential Good   Current Impairments/barriers affecting progress: currently cannot drive   OT Frequency 2x / week   OT Duration 4 weeks   OT Treatment/Interventions Self-care/ADL training;Therapeutic exercise;Patient/family education;Ultrasound;Neuromuscular education;Manual  Therapy;Iontophoresis;Cryotherapy;DME and/or AE instruction;Therapeutic activities;Electrical Stimulation;Moist Heat;Passive range of motion   Plan P: Pt will benefit from skilled OT services to increase functional performance during ADL tasks while using RUE as dominant. Treatment plan: shoulder and scapular strengthening, grip and pinch strengthening   Clinical Decision Making Limited treatment options, no task modification  necessary   OT Home Exercise Plan 12/03/16: grip and pinch strength exercises, hand coordination exercises   Consulted and Agree with Plan of Care Patient      Patient will benefit from skilled therapeutic intervention in order to improve the following deficits and impairments:  Decreased strength, Impaired flexibility, Decreased activity tolerance, Improper body mechanics, Decreased mobility, Decreased range of motion, Pain, Decreased coordination, Increased fascial restricitons, Impaired UE functional use  Visit Diagnosis: Other lack of coordination  Other symptoms and signs involving the musculoskeletal system    Problem List Patient Active Problem List   Diagnosis Date Noted  . Thrombotic cerebral infarction ([place]) 11/05/2016  . Weakness of extremity   . Cocaine abuse   . Chronic diastolic congestive heart failure ([place])   . Smoker   . Alcohol abuse   . Hyperlipidemia   . Acute ischemic stroke ([place]) 11/03/2016  . Numbness on right side 11/02/2016  . Hypokalemia 11/02/2016  . Substance abuse 11/02/2016  . 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 Stroke [facility]) 11/02/2016    Luther Hearing, OT Student [phone] 12/03/2016, 5:55 PM  [place], [place], [address] Phone: [phone]   Fax:  [phone]  Name: Philip Howard MRN: [id number] Date of Birth: 08-11-1965   Note reviewed by clinical instructor and accurately reflects treatment session.   Ailene Ravel,  OTR/L,CBIS  [phone]

## 2016-12-04 ENCOUNTER — Ambulatory Visit (HOSPITAL_COMMUNITY): Payer: Self-pay | Admitting: Physical Therapy

## 2016-12-04 DIAGNOSIS — M6281 Muscle weakness (generalized): Secondary | ICD-10-CM

## 2016-12-04 DIAGNOSIS — R2681 Unsteadiness on feet: Secondary | ICD-10-CM

## 2016-12-04 DIAGNOSIS — R2689 Other abnormalities of gait and mobility: Secondary | ICD-10-CM

## 2016-12-04 NOTE — Therapy (Signed)
[place], [place], [address] Phone: [phone]   Fax:  [phone]  Physical Therapy Treatment (Discharge)  Patient Details  Name: MASTON WIGHT MRN: [id number] Date of Birth: 1966-03-06 Referring Provider: Alysia Penna   Encounter Date: 12/04/2016      PT End of Session - 12/04/16 1141    Visit Number 2   Number of Visits 2   Date for PT Re-Evaluation 12/05/16   Authorization Type No insurance/self-pay (medicaid potential)   Authorization Time Period 11/21/16 to 12/21/16   PT Start Time 1115   PT Stop Time 1133  patient very high level, not appropriate for skilled PT services; DC today    PT Time Calculation (min) 18 min   Activity Tolerance Patient tolerated treatment well   Behavior During Therapy [facility] for tasks assessed/performed      No past medical history on file.  Past Surgical History:  Procedure Laterality Date  . arthroscopic surgery      There were no vitals filed for this visit.      Subjective Assessment - 12/04/16 1116    Subjective Patient states he has been working on [employer] and also going to [employer]. His main concern remains mostly focused on his Hand/arm function.    Pertinent History vertebral artery occlusion, hx CVA, hx arthroscopic surgery knee    Patient Stated Goals get back to werhe he was    Currently in Pain? No/denies            [facility] PT Assessment - 12/04/16 0001      Strength   Right Hip Flexion 5/5   Right Hip Extension 4+/5   Right Hip ABduction 5/5   Left Hip Flexion 5/5   Left Hip Extension 5/5   Left Hip ABduction 5/5   Right Knee Flexion 5/5   Right Knee Extension 5/5   Left Knee Flexion 5/5   Left Knee Extension 5/5   Right Ankle Dorsiflexion 5/5   Left Ankle Dorsiflexion 5/5     Dynamic Gait Index   Level Surface Normal   Change in Gait Speed Normal   Gait with Horizontal Head Turns Normal   Gait with Vertical Head Turns Normal   Gait and  Pivot Turn Normal   Step Over Obstacle Normal   Step Around Obstacles Normal   Steps Normal   Total Score 24                             PT Education - 12/04/16 1139    Education provided Yes   Education Details progress with advanced HEP/recovery in general from PT viewpoint; discussed general activity routine at [facility] as well as progression and modification including trying to walk longer at 70% of normal pace with care regarding mechanics on TM; encouraged pateint to speak to [facility] staff regarding possible adjustment of membership fees due to financial concerns; DC from PT today, strongly recommend continuing with skilled OT services however    Person(s) Educated Patient   Methods Explanation   Comprehension Verbalized understanding          PT Short Term Goals - 12/04/16 1143      PT SHORT TERM GOAL #1   Title Patient to be educated on and independent with appropriate strength/balance extensive HEP for targeted exercise and self-progression to assist in recovery of CVA    Time 1   Period Days  Status Achieved           PT Long Term Goals - 12/04/16 1143      PT LONG TERM GOAL #1   Title N/A- no long term goals appropriate at this time                Plan - 12/04/16 1142    Clinical Impression Statement Patient arrives today stating that he is doing well, he has been working hard on his HEP and going to the [facility] every day. Mini-reassessment shows excellent improvement and general scoring on MMT and DGI, gait pattern also appears to have improved as well. Patient, from PT standpoint, is very high level and is not appropriate for skilled PT services at this time. Extensive education provided (see section for details) regarding activity recommendations moving forward. Recommend DC from skilled PT services today.    Clinical Decision Making Low   Rehab Potential Excellent   Clinical Impairments Affecting Rehab Potential (+) very motivated to  actively participate in recovery from CVA    PT Next Visit Plan DC today    PT Home Exercise Plan Eval: lunges, squats, sit to stand with weight to R, standing ankle DF, step ups, SLS, tandem stance, toe taps, tandem gait, retrogait    Consulted and Agree with Plan of Care Patient      Patient will benefit from skilled therapeutic intervention in order to improve the following deficits and impairments:  Abnormal gait, Decreased coordination, Decreased activity tolerance, Decreased strength, Decreased balance, Difficulty walking  Visit Diagnosis: Muscle weakness (generalized)  Other abnormalities of gait and mobility  Unsteadiness on feet     Problem List Patient Active Problem List   Diagnosis Date Noted  . Thrombotic cerebral infarction ([place]) 11/05/2016  . Weakness of extremity   . Cocaine abuse   . Chronic diastolic congestive heart failure ([place])   . Smoker   . Alcohol abuse   . Hyperlipidemia   . Acute ischemic stroke ([place]) 11/03/2016  . Numbness on right side 11/02/2016  . Hypokalemia 11/02/2016  . Substance abuse 11/02/2016  . 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 Stroke ([place]) 11/02/2016   PHYSICAL THERAPY DISCHARGE SUMMARY  Visits from Start of Care: 2  Current functional level related to goals / functional outcomes: From Physical Therapy standpoint, patient is very high level and not in need of skilled PT services; he is independent in advanced HEP and also regularly exercises at local [employer]. DC from skilled PT services today.    Remaining deficits: Very mild functional weakness; majority of deficits appear to be related to UE/hand rather than in PT realm    Education / Equipment: See section above Plan: Patient agrees to discharge.  Patient goals were met. Patient is being discharged due to being pleased with the current functional level.  ?????       Deniece Ree PT, DPT [place] [address] [place], [place], [address] Phone: [phone]   Fax:  [phone]  Name: TRISTAIN DAILY MRN: [id number] Date of Birth: 23-Jul-1965

## 2016-12-10 ENCOUNTER — Ambulatory Visit (HOSPITAL_COMMUNITY): Payer: Self-pay | Admitting: Occupational Therapy

## 2016-12-10 ENCOUNTER — Other Ambulatory Visit: Payer: Self-pay | Admitting: *Deleted

## 2016-12-10 ENCOUNTER — Ambulatory Visit (INDEPENDENT_AMBULATORY_CARE_PROVIDER_SITE_OTHER): Payer: Self-pay | Admitting: Nurse Practitioner

## 2016-12-10 ENCOUNTER — Encounter (HOSPITAL_COMMUNITY): Payer: Self-pay | Admitting: Occupational Therapy

## 2016-12-10 ENCOUNTER — Encounter: Payer: Self-pay | Admitting: Nurse Practitioner

## 2016-12-10 VITALS — BP 121/74 | HR 56 | Ht 65.0 in | Wt 198.2 lb

## 2016-12-10 DIAGNOSIS — I639 Cerebral infarction, unspecified: Secondary | ICD-10-CM

## 2016-12-10 DIAGNOSIS — E782 Mixed hyperlipidemia: Secondary | ICD-10-CM

## 2016-12-10 DIAGNOSIS — R278 Other lack of coordination: Secondary | ICD-10-CM

## 2016-12-10 DIAGNOSIS — R29898 Other symptoms and signs involving the musculoskeletal system: Secondary | ICD-10-CM

## 2016-12-10 DIAGNOSIS — R200 Anesthesia of skin: Secondary | ICD-10-CM

## 2016-12-10 DIAGNOSIS — I6300 Cerebral infarction due to thrombosis of unspecified precerebral artery: Secondary | ICD-10-CM

## 2016-12-10 NOTE — Therapy (Signed)
[place], [place], [address] Phone: [phone]   Fax:  [phone]  Occupational Therapy Treatment  Patient Details  Name: Philip Howard MRN: [id number] Date of Birth: 1966-01-16 Referring Provider: Lynford Citizen MD  Encounter Date: 12/10/2016      OT End of Session - 12/10/16 1517    Visit Number 2   Number of Visits 8   Date for OT Re-Evaluation 12/31/16   Authorization Type Medicaid potential - Pt is self pay and is aware   OT Start Time 1430   OT Stop Time 1514   OT Time Calculation (min) 44 min   Activity Tolerance Patient tolerated treatment well   Behavior During Therapy [facility] for tasks assessed/performed      History reviewed. No pertinent past medical history.  Past Surgical History:  Procedure Laterality Date  . arthroscopic surgery      There were no vitals filed for this visit.      Subjective Assessment - 12/10/16 1430    Subjective  S: My fingers feel tingly.    Currently in Pain? No/denies            [facility] OT Assessment - 12/10/16 1429      Assessment   Diagnosis Left Basal Ganglia Infarct     Precautions   Precautions Other (comment)   Precaution Comments no driving/lifting heavy items                  OT Treatments/Exercises (OP) - 12/10/16 1433      Exercises   Exercises Hand;Neurological Re-education     Hand Exercises   Hand Gripper with Large Beads all beads gripper at 38#   Hand Gripper with Medium Beads all beads gripper at 38#   Hand Gripper with Small Beads all beads gripper at 35#   Sponges Pt used green clothespin and 3 point pinch to stack 5 sponges, pt then placed 15 sponges into bucket using green clothespin     Fine Motor Coordination   Fine Motor Coordination Manipulating coins;Dealing card with thumb   Dealing card with thumb Pt dealt cards with left hand, grasping and holding in palm with right hand. No difficulty. Pt then  shuffled cards-breaking deck and folding, mod difficulty, 5x   Manipulating coins Pt held 8 and 10 coins at one time working on palm to fingertip translation. Mod difficulty holding coins while dropping into container. 3 trials completed   Stacking coins Pt held handful of coins in right hand, working to [employer] on tabletop. Mod/max difficulty holding coins in palm while stacking, min/mod difficulty with stacking     Neurological Re-education Exercises   Shoulder Flexion Theraband;10 reps  red   Shoulder ABduction Theraband;10 reps  red   Shoulder Protraction Theraband;10 reps  red   Shoulder Horizontal ABduction Theraband;10 reps  red   Shoulder External Rotation Theraband;10 reps  red   Shoulder Internal Rotation Theraband;10 reps  red                OT Education - 12/10/16 1439    Education provided Yes   Education Details provided evaluation and reviewed goals   Person(s) Educated Patient   Methods Explanation;Handout   Comprehension Verbalized understanding          OT Short Term Goals - 12/10/16 1439      OT SHORT TERM GOAL #1   Title Pt will be educated and independent with HEP to increase RUE  use in daily activities.   Time 4   Period Weeks   Status On-going     OT SHORT TERM GOAL #2   Title Pt will increase RUE strength to 4+/5 with abduction and er to increase ability to use RUE as dominant with gardening and other outdoor activities.   Time 4   Period Weeks   Status On-going     OT SHORT TERM GOAL #3   Title Pt will increase pinch strength by 2# to increase ability to complete tying shoes with less difficulty.   Time 4   Period Weeks   Status On-going     OT SHORT TERM GOAL #4   Title Pt will increase grip strength by 15# to increase ability to return to completing leisure tasks such as fishing.   Time 4   Period Weeks   Status On-going                  Plan - 12/10/16 1517    Clinical Impression Statement A: Initiated RUE  strengthening, grip and pinch strengthening, and fine motor coordination tasks this session. Pt demonstrates good form during theraband exercises, verbal cuing for control and technique. Pt with difficulty during fine motor task completion, increased time required. Educated pt on fine motor practice at home, also provided evaluation and reviewed goals.    Plan P: continue with RUE strengthening & provide HEP. Grip and pinch strengthening, fine motor task-small pegboard   OT Home Exercise Plan 12/03/16: grip and pinch strength exercises, hand coordination exercises   Consulted and Agree with Plan of Care Patient      Patient will benefit from skilled therapeutic intervention in order to improve the following deficits and impairments:  Decreased strength, Impaired flexibility, Decreased activity tolerance, Improper body mechanics, Decreased mobility, Decreased range of motion, Pain, Decreased coordination, Increased fascial restricitons, Impaired UE functional use  Visit Diagnosis: Other lack of coordination  Other symptoms and signs involving the musculoskeletal system    Problem List Patient Active Problem List   Diagnosis Date Noted  . Thrombotic cerebral infarction ([place]) 11/05/2016  . Weakness of extremity   . Cocaine abuse   . Chronic diastolic congestive heart failure ([place])   . Smoker   . Alcohol abuse   . Hyperlipidemia   . Acute ischemic stroke ([place]) 11/03/2016  . Numbness on right side 11/02/2016  . Hypokalemia 11/02/2016  . Substance abuse 11/02/2016  . 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 Stroke [facility]) 11/02/2016   Guadelupe Sabin, OTR/L  [phone] 12/10/2016, 3:20 PM  [place] [address] [place], [place], [address] Phone: [phone]   Fax:  [phone]  Name: JA OHMAN MRN: [id number] Date of Birth: Dec 23, 1965

## 2016-12-10 NOTE — Patient Instructions (Addendum)
Stressed the importance of management of risk factors to prevent further stroke Continue aspirin for secondary stroke prevention Maintain strict control of hypertension with blood pressure goal below 130/90, today's reading 121/74  Will set up for cardiac event monitor Control of diabetes with hemoglobin A1c below 6.5 followed by primary care most recent hemoglobin A1c5.7 Cholesterol with LDL cholesterol less than 70, followed by primary care,  most recent 166 continue statin drugs Lipitor Continue rehabilitation then home exercise program Exercise by walking, slowly increase , eat healthy diet with whole grains,  fresh fruits and vegetables Patient has stopped smoking and drinking was congratulated for this  follow up with [place] in 6 months

## 2016-12-10 NOTE — Progress Notes (Signed)
[facility]  PATIENT: Philip Howard DOB: 1966/02/08   REASON FOR VISIT: Hospital follow-up for stroke HISTORY FROM: Patient    HISTORY OF PRESENT ILLNESS:FROM RECORD This is a 51 year old right-handed man who reports that he was having lunch with his girlfriend yesterday at about 1430 when he noticed the acute onset of numbness and weakness on his right side. He states that this involved the face, arm, and leg. He denies any associated vision changes, difficulty speaking, difficulty swallowing, coordination problems, gait disturbance, or left-sided symptoms. He has never had any similar prior symptoms. He presented to the [facility] emergency department where the ED noted very subtle weakness on the right side. CT scan of the head was obtained and showed no evidence of an acute stroke or other abnormality. He had a CT angiogram of the head and neck performed which demonstrated occlusion of the right vertebral artery at its origin and additional scattered areas of atherosclerotic change with some stenosis noted in both middle cerebral arteries. He was not a candidate for thrombolytic therapy given minor deficits. He has been transferred to [facility] for further evaluation and management. Neurology consultation is requested for assistance. The patient states he takes aspirin 2-3 times per week. Last known well:1430 on 11/02/16 NHISS score: 2 mRS score: 0 tPA given?: No, out of window with minor deficits. 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 2-D echo with 55-60% EF. LDL 166 hemoglobin A1c 5.7 Interval history 12/10/16 CM Mr. 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 He remains on aspirin 325 mg  daily secondary stroke prevention without further stroke or TIA symptoms. In addition he is on Lipitor 40 mg daily without myalgias. He is continuing to get occupational therapy however his  physical therapy has stopped. He claims he is doing home exercise program and he is joining the [facility] Blood pressure in the office today 124/74. He denies further cocaine use however he continues to smoke marijuana. He also states he stopped alcohol and tobacco after his stroke. He continues to have a little bit of numbness right hand and face. 30 day event monitoring was recommended however this is not been scheduled. He returns for reevaluation REVIEW OF SYSTEMS: Full 14 system review of systems performed and notable only for those listed, all others are neg:  Constitutional: neg  Cardiovascular: neg Ear/Nose/Throat: neg  Skin: neg Eyes: neg Respiratory: neg Gastroitestinal: neg  Hematology/Lymphatic: neg  Endocrine: neg Musculoskeletal: Walking difficulty Allergy/Immunology: neg Neurological: Numbness in the right face and hand Psychiatric: neg Sleep : neg   ALLERGIES: No Known Allergies  HOME MEDICATIONS: Outpatient Medications Prior to Visit  Medication Sig Dispense Refill  . aspirin 325 MG tablet Take 1 tablet (325 mg total) by mouth daily.    Marland Kitchen atorvastatin (LIPITOR) 40 MG tablet Take 1 tablet (40 mg total) by mouth daily at 6 PM. 30 tablet 1  . methocarbamol (ROBAXIN) 500 MG tablet Take 1 tablet (500 mg total) by mouth every 8 (eight) hours as needed for muscle spasms. 30 tablet 0   No facility-administered medications prior to visit.     PAST MEDICAL HISTORY: History reviewed. No pertinent past medical history.  PAST SURGICAL HISTORY: Past Surgical History:  Procedure Laterality Date  . arthroscopic surgery  FAMILY HISTORY: History reviewed. No pertinent family history.  SOCIAL HISTORY: Social History   Social History  . Marital status: Single    Spouse name: N/A  .  Number of children: N/A  . Years of education: N/A   Occupational History  . Not on file.   Social History Main Topics  . Smoking status: Former Smoker    Packs/day: 0.50    Types: Cigarettes    Quit date: 11/02/2016  . Smokeless tobacco: Never Used  . Alcohol use Yes  . Drug use: Yes    Types: Marijuana  . Sexual activity: Yes   Other Topics Concern  . Not on file   Social History Narrative  . No narrative on file     PHYSICAL EXAM  Vitals:   12/10/16 0814  BP: 121/74  Pulse: (!) 56  Weight: 198 lb 3.2 oz (89.9 kg)  Height: 5\' 5"  (1.651 m)   Body mass index is 32.98 kg/m.  Generalized: Well developed, Obese male in no acute distress  Head: normocephalic and atraumatic,. Oropharynx benign  Neck: Supple, no carotid bruits  Cardiac: Regular rate rhythm, no murmur  Musculoskeletal: No deformity   Neurological examination   Mentation: Alert oriented to time, place, history taking. Attention span and concentration appropriate. Recent and remote memory intact.  Follows all commands speech and language fluent.   Cranial nerve II-XII: Fundoscopic exam reveals sharp disc margins.Pupils were equal round reactive to light extraocular movements were full, visual field were full on confrontational test. Facial sensation decreased on the right and strength were normal. hearing was intact to finger rubbing bilaterally. Uvula tongue midline. head turning and shoulder shrug were normal and symmetric.Tongue protrusion into cheek strength was normal. Motor: normal bulk and tone, full strength in the BUE, BLE, except 4 out of 5 right upper extremity with decreased range of motion to shoulder  Sensory: normal and symmetric to light touch, pinprick, and  Vibration, in the upper and lower extremities Coordination: Mildly decreased finger-nose-finger, on the right upper extremity  Reflexes: Symmetric upper and lower plantar responses were flexor bilaterally. Gait and Station: Rising up  from seated position without assistance, normal stance,  moderate stride, good arm swing, smooth turning, able to perform tiptoe, and heel walking without difficulty. Tandem gait is mildly un steady  DIAGNOSTIC DATA (LABS, IMAGING, TESTING) - I reviewed patient records, labs, notes, testing and imaging myself where available.  Lab Results  Component Value Date   WBC 6.7 11/06/2016   HGB 14.9 11/06/2016   HCT 45.0 11/06/2016   MCV 91.6 11/06/2016   PLT 257 11/06/2016      Component Value Date/Time   NA 135 11/09/2016 0436   K 3.6 11/09/2016 0436   CL 104 11/09/2016 0436   CO2 21 (L) 11/09/2016 0436   GLUCOSE 110 (H) 11/09/2016 0436   BUN 26 (H) 11/09/2016 0436   CREATININE 1.22 11/09/2016 0436   CALCIUM 9.5 11/09/2016 0436   PROT 6.8 11/06/2016 0535   ALBUMIN 3.9 11/06/2016 0535   AST 49 (H) 11/06/2016 0535   ALT 61 11/06/2016 0535   ALKPHOS 62 11/06/2016 0535   BILITOT 0.8 11/06/2016 0535   GFRNONAA >60 11/09/2016 0436   GFRAA >60 11/09/2016 0436   Lab Results  Component Value Date   CHOL 283 (H) 11/04/2016   HDL 39 (L) 11/04/2016   LDLCALC 166 (H) 11/04/2016   TRIG 388 (H) 11/04/2016   CHOLHDL 7.3 11/04/2016   Lab Results  Component Value Date  HGBA1C 5.7 (H) 11/03/2016    ASSESSMENT AND PLAN  51 y.o. 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    PLAN: Stressed the importance of management of risk factors to prevent further stroke Continue aspirin for secondary stroke prevention Maintain strict control of hypertension with blood pressure goal below 130/90, today's reading 121/74  Will set up for cardiac event monitor Control of diabetes with hemoglobin A1c below 6.5 followed by primary care most recent hemoglobin A1c5.7 Cholesterol with LDL cholesterol less than 70, followed by primary care,  most recent 166 continue statin drugs Lipitor Continue  rehabilitation then home exercise program Exercise by walking, slowly increase , eat healthy diet with whole grains,  fresh fruits and vegetables Patient has stopped smoking and drinking was congratulated for this, he has also stopped cocaine  follow up with [place] in 6 months Discussed risk for recurrent stroke/ TIA and answered additional questions This was a visit requiring 30 minutes of  medical decision making of high complexity with extensive review of history, hospital chart, counseling and answering questions Dennie Bible, [facility], [facility], APRN  [facility] [address], [place], [place] [address] [phone]

## 2016-12-11 NOTE — Progress Notes (Signed)
I reviewed above note and agree with the assessment and plan.  Rosalin Hawking, MD PhD Stroke Neurology 12/11/2016 7:53 AM

## 2016-12-13 ENCOUNTER — Ambulatory Visit (HOSPITAL_COMMUNITY): Payer: Self-pay | Admitting: Occupational Therapy

## 2016-12-13 ENCOUNTER — Encounter (HOSPITAL_COMMUNITY): Payer: Self-pay | Admitting: Occupational Therapy

## 2016-12-13 DIAGNOSIS — R278 Other lack of coordination: Secondary | ICD-10-CM

## 2016-12-13 DIAGNOSIS — R29898 Other symptoms and signs involving the musculoskeletal system: Secondary | ICD-10-CM

## 2016-12-13 NOTE — Therapy (Signed)
[place], [place], [address] Phone: [phone]   Fax:  [phone]  Occupational Therapy Treatment  Patient Details  Name: Philip Howard MRN: [id number] Date of Birth: 10-Sep-1965 Referring Provider: Lynford Citizen MD  Encounter Date: 12/13/2016      OT End of Session - 12/13/16 1558    Visit Number 3   Number of Visits 8   Date for OT Re-Evaluation 12/31/16   Authorization Type Medicaid potential - Pt is self pay and is aware   OT Start Time 1515   OT Stop Time 1602   OT Time Calculation (min) 47 min   Activity Tolerance Patient tolerated treatment well   Behavior During Therapy [facility] for tasks assessed/performed      History reviewed. No pertinent past medical history.  Past Surgical History:  Procedure Laterality Date  . arthroscopic surgery      There were no vitals filed for this visit.      Subjective Assessment - 12/13/16 1515    Subjective  S: I was a little sore from those bands.    Currently in Pain? No/denies            [facility] OT Assessment - 12/13/16 1514      Assessment   Diagnosis Left Basal Ganglia Infarct     Precautions   Precautions Other (comment)   Precaution Comments no driving/lifting heavy items                  OT Treatments/Exercises (OP) - 12/13/16 1516      Exercises   Exercises Hand;Neurological Re-education     Hand Exercises   Theraputty Flatten;Locate Pegs   Theraputty - Flatten red   Theraputty - Locate Pegs red-5 pegs located   Hand Gripper with Large Beads all beads gripper at 42#   Hand Gripper with Medium Beads all beads gripper at 42#   Hand Gripper with Small Beads all beads gripper at 42#   Other Hand Exercises Pt placed all colors of clothespins along pinch tree, using lateral pinch. Pt then removed using 3 point pinch. Focus on shoulder flexion and pinch strengthening     Fine Motor Coordination   Fine Motor Coordination  Small Pegboard   Small Pegboard Pt placed pegs one at a time into pegboard following pattern. Pt attempted to hold multiple pegs in right hand while placing pegs, mod/max difficulty maintaining holding onto pegs in palm. Min difficulty when holding and placing just one at a time. To remove pt removed one at a time and held in palm, able to hold 90% of pegs without dropping.    Other Fine Motor Exercises Timed perfection game: pt placed pieces in 1'46"     Neurological Re-education Exercises   Other Exercises 1 UBE Level 1 2' forward 2' reverse                  OT Short Term Goals - 12/10/16 1439      OT SHORT TERM GOAL #1   Title Pt will be educated and independent with HEP to increase RUE use in daily activities.   Time 4   Period Weeks   Status On-going     OT SHORT TERM GOAL #2   Title Pt will increase RUE strength to 4+/5 with abduction and er to increase ability to use RUE as dominant with gardening and other outdoor activities.   Time 4   Period Weeks   Status  On-going     OT SHORT TERM GOAL #3   Title Pt will increase pinch strength by 2# to increase ability to complete tying shoes with less difficulty.   Time 4   Period Weeks   Status On-going     OT SHORT TERM GOAL #4   Title Pt will increase grip strength by 15# to increase ability to return to completing leisure tasks such as fishing.   Time 4   Period Weeks   Status On-going                  Plan - 12/13/16 1558    Clinical Impression Statement A: Continued with grip and pinch strengthening, increasing gripper strength to 42# for all bead sizes; added small pegboard task and red theraputty task. Added UBE for shoulder strengthening, did not complete theraband exercises due to time constraints.    Plan P: Resume red theraband strengthening & provide HEP. Continue grip and pinch strengthening and fine motor coordination tasks.    OT Home Exercise Plan 12/03/16: grip and pinch strength exercises,  hand coordination exercises   Consulted and Agree with Plan of Care Patient      Patient will benefit from skilled therapeutic intervention in order to improve the following deficits and impairments:  Decreased strength, Impaired flexibility, Decreased activity tolerance, Improper body mechanics, Decreased mobility, Decreased range of motion, Pain, Decreased coordination, Increased fascial restricitons, Impaired UE functional use  Visit Diagnosis: Other lack of coordination  Other symptoms and signs involving the musculoskeletal system    Problem List Patient Active Problem List   Diagnosis Date Noted  . Thrombotic cerebral infarction ([place]) 11/05/2016  . Weakness of extremity   . Cocaine abuse   . Chronic diastolic congestive heart failure ([place])   . Smoker   . Alcohol abuse   . Hyperlipidemia   . Acute ischemic stroke ([place]) 11/03/2016  . Numbness on right side 11/02/2016  . Hypokalemia 11/02/2016  . Substance abuse 11/02/2016  . 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 Stroke [facility]) 11/02/2016   Guadelupe Sabin, OTR/L  [phone] 12/13/2016, 4:02 PM  [place] [address] [place], [place], [address] Phone: [phone]   Fax:  [phone]  Name: Philip Howard MRN: [id number] Date of Birth: [birth year]

## 2016-12-17 ENCOUNTER — Encounter (HOSPITAL_COMMUNITY): Payer: Self-pay

## 2016-12-17 ENCOUNTER — Ambulatory Visit (HOSPITAL_COMMUNITY): Payer: Self-pay | Attending: Physical Medicine & Rehabilitation

## 2016-12-17 DIAGNOSIS — R278 Other lack of coordination: Secondary | ICD-10-CM | POA: Insufficient documentation

## 2016-12-17 DIAGNOSIS — R29898 Other symptoms and signs involving the musculoskeletal system: Secondary | ICD-10-CM | POA: Insufficient documentation

## 2016-12-17 NOTE — Therapy (Signed)
[place], [place], [address] Phone: [phone]   Fax:  [phone]  Occupational Therapy Treatment  Patient Details  Name: Philip Howard MRN: [id number] Date of Birth: 1966/04/02 Referring Provider: Lynford Citizen MD  Encounter Date: 12/17/2016      OT End of Session - 12/17/16 1206    Visit Number 4   Number of Visits 8   Date for OT Re-Evaluation 12/31/16   Authorization Type Medicaid potential - Pt is self pay and is aware   OT Start Time 1116   OT Stop Time 1154   OT Time Calculation (min) 38 min   Activity Tolerance Patient tolerated treatment well   Behavior During Therapy [facility] for tasks assessed/performed      History reviewed. No pertinent past medical history.  Past Surgical History:  Procedure Laterality Date  . arthroscopic surgery      There were no vitals filed for this visit.      Subjective Assessment - 12/17/16 1123    Subjective  S: I'm doing alright today, feeling good.   Currently in Pain? No/denies            [facility] OT Assessment - 12/17/16 0001      Assessment   Diagnosis Left Basal Ganglia Infarct     Precautions   Precautions Other (comment)   Precaution Comments no driving/lifting heavy items            OT Treatments/Exercises (OP) - 12/17/16 1116      Exercises   Exercises Hand;Neurological Re-education       Hand Exercises   Hand Gripper with Large Beads all beads gripper at 45#   Hand Gripper with Medium Beads all beads gripper at 42#   Hand Gripper with Small Beads all beads gripper at 42#   Other Hand Exercises Pt placed all colors of clothespins along pinch tree, using 3-point pinch. Pt then removed using lateral pinch. Focus on shoulder flexion and pinch strengthening     Neurological Re-education Exercises   Shoulder Flexion Theraband;10 reps-red   Shoulder ABduction Theraband;10 reps-red   Shoulder Protraction Theraband;10 reps-red   Shoulder Horizontal ABduction Theraband;10 reps-red   Shoulder External Rotation Theraband;10 reps-red   Shoulder Internal Rotation Theraband;10 reps-red             OT Education - 12/17/16 1138    Education provided Yes   Education Details provided pt with shoulder exercises with theraband   Person(s) Educated Patient   Methods Explanation;Demonstration;Verbal cues;Handout   Comprehension Returned demonstration;Verbalized understanding          OT Short Term Goals - 12/10/16 1439      OT SHORT TERM GOAL #1   Title Pt will be educated and independent with HEP to increase RUE use in daily activities.   Time 4   Period Weeks   Status On-going     OT SHORT TERM GOAL #2   Title Pt will increase RUE strength to 4+/5 with abduction and er to increase ability to use RUE as dominant with gardening and other outdoor activities.   Time 4   Period Weeks   Status On-going     OT SHORT TERM GOAL #3   Title Pt will increase pinch strength by 2# to increase ability to complete tying shoes with less difficulty.   Time 4   Period Weeks   Status On-going     OT SHORT TERM GOAL #4   Title Pt  will increase grip strength by 15# to increase ability to return to completing leisure tasks such as fishing.   Time 4   Period Weeks   Status On-going           Plan - 12/17/16 1210    Clinical Impression Statement A: Completed theraband exercises for shoulder strengthening. Pt increased hand gripper weight for large beads but was unable to increase weight for other beads due to max difficulty. Completed pinch tree with VC needed for form and technique.   Plan P: Increase reps for theraband exercises. Focus on fine motor coordination tasks. Attempt grooved pegs with tweezers.      Patient will benefit from skilled therapeutic intervention in order to improve the following deficits and impairments:  Decreased strength, Impaired flexibility, Decreased activity tolerance, Improper body  mechanics, Decreased mobility, Decreased range of motion, Pain, Decreased coordination, Increased fascial restricitons, Impaired UE functional use  Visit Diagnosis: Other lack of coordination  Other symptoms and signs involving the musculoskeletal system    Problem List Patient Active Problem List   Diagnosis Date Noted  . 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 Weakness of extremity   . Cocaine abuse   . Chronic diastolic congestive heart failure ([place])   . Smoker   . Alcohol abuse   . Hyperlipidemia   . Acute ischemic stroke ([place]) 11/03/2016  . Numbness on right side 11/02/2016  . Hypokalemia 11/02/2016  . Substance abuse 11/02/2016  . 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 Stroke [facility]) 11/02/2016    Luther Hearing, OT Student [phone] 12/17/2016, 2:01 PM  [place] [address] [place], [place], [address] Phone: [phone]   Fax:  [phone]  Name: Philip Howard MRN: [id number] Date of Birth: Apr 26, 1966   Note reviewed by clinical instructor and accurately reflects treatment session.   Ailene Ravel, OTR/L,CBIS  [phone]

## 2016-12-17 NOTE — Patient Instructions (Addendum)
Retraction    Facing anchor, hold hands and elbow at shoulder height, with elbow bent.  Pull arms back to squeeze shoulder blades together. Repeat 10-15 times.    Row   Facing anchor, arms reaching forward, pull hands toward stomach, keeping elbows bent and at your sides and pinching shoulder blades together. Repeat 10-15 times.    Extension   Face anchor, pull arms back, keeping elbow straight, and squeze shoulder blades together. Repeat 10-15 times.    SHOULDER FLEXION WITH THERABAND  While standing with back to the door, holding Theraband at hand level, raise arm in front of you.  Keep elbow straight through entire movement.        Protraction  Punch out 10 times, slow and controlled      Horizontal Abduction   Start by holding the theraband out in front of the body with the elbow straight. The hand and elbow should be directly aligned with the top of the shoulder. Then, pull the therband out and allow the arm to move to the side of the body while still maintaining a straight elbow. Squeeze the shoulder blade and hold for 2 seconds, then return to the starting position.          Shoulder Abduction - Theraband  Place one end of theraband under foot and one in hand.  Keeping elbow straight, raise the arm out to the side.        Internal Rotation  Using a theraband that is anchored, bend your elbow and place a towel roll between your elbow and ribcage.  Then rotate your arm towards your stomach.   Make sure your shoulder blades are squeezed back.        Shoulder External Rotation  Standing with your arm at 90 degrees by your side, using a towel to keep your arm at your side.  Grasp a theraband, pull your shoulder blades back, then proceed to rotate just the forearm out to the side and control the movement back to start.  Repeat.

## 2016-12-19 ENCOUNTER — Telehealth: Payer: Self-pay | Admitting: Nurse Practitioner

## 2016-12-19 NOTE — Telephone Encounter (Signed)
Left message for pt to call to discuss the options for life watch  Financial aide.  Pt will need to fill out the [employer] hardship paper work if he does not have insurance and provide current year tax return for proof of income.    Davy Pique

## 2016-12-24 ENCOUNTER — Encounter (HOSPITAL_COMMUNITY): Payer: Self-pay | Admitting: Occupational Therapy

## 2016-12-24 ENCOUNTER — Ambulatory Visit (HOSPITAL_COMMUNITY): Payer: Self-pay | Admitting: Occupational Therapy

## 2016-12-24 DIAGNOSIS — R29898 Other symptoms and signs involving the musculoskeletal system: Secondary | ICD-10-CM

## 2016-12-24 DIAGNOSIS — R278 Other lack of coordination: Secondary | ICD-10-CM

## 2016-12-24 NOTE — Therapy (Addendum)
[place], [place], [address] Phone: [phone]   Fax:  [phone]  Occupational Therapy Treatment  Patient Details  Name: Philip Howard MRN: [id number] Date of Birth: 02-25-1966 Referring Provider: Lynford Citizen MD  Encounter Date: 12/24/2016      OT End of Session - 12/24/16 0957    Visit Number 5   Number of Visits 8   Date for OT Re-Evaluation 12/31/16   Authorization Type Medicaid potential - Pt is self pay and is aware   OT Start Time 0902   OT Stop Time 0945   OT Time Calculation (min) 43 min   Activity Tolerance Patient tolerated treatment well   Behavior During Therapy [facility] for tasks assessed/performed      History reviewed. No pertinent past medical history.  Past Surgical History:  Procedure Laterality Date  . arthroscopic surgery      There were no vitals filed for this visit.      Subjective Assessment - 12/24/16 0908    Subjective  S: I noticed this morning that I am getting better writing my name.   Currently in Pain? No/denies            [facility] OT Assessment - 12/24/16 0911      Assessment   Diagnosis Left Basal Ganglia Infarct     Precautions   Precautions Other (comment)   Precaution Comments no driving/lifting heavy items          OT Treatments/Exercises (OP) - 12/24/16 0905      Exercises   Exercises Hand;Neurological Re-education     Hand Exercises   Hand Gripper with Large Beads all beads gripper at 42#   Hand Gripper with Medium Beads all beads gripper at 42#   Hand Gripper with Small Beads all beads gripper at 42#     Fine Motor Coordination   Fine Motor Coordination Grooved pegs;Tossing ball   Manipulating coins Pt held 10 and 12 coins at one time working on palm to fingertip translation. Mod difficulty holding coins while dropping into container. 2 trials completed.   Tossing ball Pt completed ball toss from palm to palm sideways and up and down  20 times each. Pt also received ball toss from therapist 10 times all focusing on right hand coordination. mod difficulty.   Grooved pegs Pt completed placing all pegs into board, then removed and placed into container using tweezers for both tasks.     Neurological Re-education Exercises   Other Information Completed row, retraction, extension 12X each, red theraband   Shoulder Flexion Theraband  12X. red   Shoulder ABduction Theraband  12X red   Shoulder Protraction Theraband  12X, red   Shoulder Horizontal ABduction Theraband  12X, red   Shoulder External Rotation Theraband  12X, red   Shoulder Internal Rotation Theraband  12X, red             OT Education - 12/24/16 0913    Education provided No          OT Short Term Goals - 12/10/16 1439      OT SHORT TERM GOAL #1   Title Pt will be educated and independent with HEP to increase RUE use in daily activities.   Time 4   Period Weeks   Status On-going     OT SHORT TERM GOAL #2   Title Pt will increase RUE strength to 4+/5 with abduction and er to increase ability to use RUE  as dominant with gardening and other outdoor activities.   Time 4   Period Weeks   Status On-going     OT SHORT TERM GOAL #3   Title Pt will increase pinch strength by 2# to increase ability to complete tying shoes with less difficulty.   Time 4   Period Weeks   Status On-going     OT SHORT TERM GOAL #4   Title Pt will increase grip strength by 15# to increase ability to return to completing leisure tasks such as fishing.   Time 4   Period Weeks   Status On-going            Plan - 12/24/16 1000    Clinical Impression Statement A: Pt completed shoulder theraband exercises with increased repetitions and ball toss activity both with VC needed for form and technique. Grooved peg activity presented as mod difficulty for manipulating pegs. Pt was unable to maintain using hand gripper at 45# for large pegs and completed activity with all  beads at 42#. Completed coin manipulation in hand task and pt reported task getting easier over time.   Plan P: Complete reassessment. Focus on fine motor coordination tasks if needed. Continue with ball tossing activity.       Patient will benefit from skilled therapeutic intervention in order to improve the following deficits and impairments:  Decreased strength, Impaired flexibility, Decreased activity tolerance, Improper body mechanics, Decreased mobility, Decreased range of motion, Pain, Decreased coordination, Increased fascial restricitons, Impaired UE functional use  Visit Diagnosis: Other symptoms and signs involving the musculoskeletal system  Other lack of coordination    Problem List Patient Active Problem List   Diagnosis Date Noted  . 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 Weakness of extremity   . Cocaine abuse   . Chronic diastolic congestive heart failure ([place])   . Smoker   . Alcohol abuse   . Hyperlipidemia   . Acute ischemic stroke ([place]) 11/03/2016  . Numbness on right side 11/02/2016  . Hypokalemia 11/02/2016  . Substance abuse 11/02/2016  . 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 Stroke [facility]) 11/02/2016    Luther Hearing, OT Student [phone] 12/24/2016, 10:04 AM  [place], [place], [address] Phone: [phone]   Fax:  [phone]  Name: Philip Howard MRN: [id number] Date of Birth: 1965-08-26   Note reviewed by qualified practitioner and accurately reflects treatment session.   Guadelupe Sabin, OTR/L  [phone] 12/24/2016

## 2016-12-27 ENCOUNTER — Encounter (HOSPITAL_COMMUNITY): Payer: Self-pay | Admitting: Occupational Therapy

## 2016-12-27 ENCOUNTER — Ambulatory Visit (HOSPITAL_COMMUNITY): Payer: Self-pay | Admitting: Occupational Therapy

## 2016-12-27 DIAGNOSIS — R29898 Other symptoms and signs involving the musculoskeletal system: Secondary | ICD-10-CM

## 2016-12-27 DIAGNOSIS — R278 Other lack of coordination: Secondary | ICD-10-CM

## 2016-12-27 NOTE — Therapy (Signed)
Philip Howard, [place], [address] Phone: [phone]   Fax:  [phone]  Occupational Therapy Reassessment, Treatment, and Discharge Summary  Patient Details  Name: Philip Howard MRN: [id number] Date of Birth: [birth year] Referring Provider: Lynford Citizen MD  Encounter Date: 12/27/2016      OT End of Session - 12/27/16 1051    Visit Number 6   Number of Visits 8   Date for OT Re-Evaluation 12/31/16   Authorization Type Medicaid potential - Pt is self pay and is aware   OT Start Time 0949   OT Stop Time 1028   OT Time Calculation (min) 39 min   Activity Tolerance Patient tolerated treatment well   Behavior During Therapy [facility] for tasks assessed/performed      History reviewed. No pertinent past medical history.  Past Surgical History:  Procedure Laterality Date  . arthroscopic surgery      There were no vitals filed for this visit.      Subjective Assessment - 12/27/16 0949    Subjective  S: I mowed my yard Wednesday.    Currently in Pain? No/denies            [facility] OT Assessment - 12/27/16 0948      Assessment   Diagnosis Left Basal Ganglia Infarct     Precautions   Precautions Other (comment)   Precaution Comments no driving/lifting heavy items     Coordination   Right 9 Hole Peg Test 22.96"  27.98" previous     Strength   Right Shoulder ABduction 4+/5  3+/5 previous   Right Shoulder External Rotation 4+/5  3+/5 previous     Hand Function   Right Hand Grip (lbs) 50  same as previous   Right Hand Lateral Pinch 20 lbs  18 previous   Right Hand 3 Point Pinch 12 lbs  same as previous                  OT Treatments/Exercises (OP) - 12/27/16 1001      Exercises   Exercises Hand;Neurological Re-education     Hand Exercises   Theraputty Flatten;Roll;Grip;Pinch   Theraputty - Flatten green   Theraputty - Roll green   Theraputty - Grip green   Theraputty - Pinch  green   Hand Gripper with Large Beads all beads gripper at 50#   Hand Gripper with Medium Beads all beads gripper at 42#   Hand Gripper with Small Beads all beads gripper at 42#   Sponges Pt used green clothespin and 3 point pinch to grasp and place 30 sponges into bucket. Min difficulty. Pt then used blue clothespin to stack 6 sponges with 3 point pinch, 2 trials, mod difficulty                OT Education - 12/27/16 1050    Education provided Yes   Education Details upgraded grip and pinch strengthening to green putty; reviewed coordination HEP   Person(s) Educated Patient   Methods Explanation;Demonstration   Comprehension Verbalized understanding;Returned demonstration          OT Short Term Goals - 12/27/16 0956      OT SHORT TERM GOAL #1   Title Pt will be educated and independent with HEP to increase RUE use in daily activities.   Time 4   Period Weeks   Status Achieved     OT SHORT TERM GOAL #2   Title Pt will increase RUE strength  to 4+/5 with abduction and er to increase ability to use RUE as dominant with gardening and other outdoor activities.   Time 4   Period Weeks   Status Achieved     OT SHORT TERM GOAL #3   Title Pt will increase pinch strength by 2# to increase ability to complete tying shoes with less difficulty.   Time 4   Period Weeks   Status Partially Met     OT SHORT TERM GOAL #4   Title Pt will increase grip strength by 15# to increase ability to return to completing leisure tasks such as fishing.   Time 4   Period Weeks   Status Not Met                  Plan - 12/27/16 1051    Clinical Impression Statement A: Reassessment completed this session, pt has met 2/4 goals and partially met an additional goal, making progress with grip and pinch strength, coordination, and RUE strength. Pt reports he is using his RUE as dominant with the majority of his tasks, continues to have fatigue and decreased grip strength for sustained  lifting/holding tasks. Pt is agreeable to discharge with HEP as he is self-pay at this time.    Plan P: Discharge pt.    OT Home Exercise Plan 12/03/16: grip and pinch strength exercises, hand coordination exercises   Consulted and Agree with Plan of Care Patient      Patient will benefit from skilled therapeutic intervention in order to improve the following deficits and impairments:  Decreased strength, Impaired flexibility, Decreased activity tolerance, Improper body mechanics, Decreased mobility, Decreased range of motion, Pain, Decreased coordination, Increased fascial restricitons, Impaired UE functional use  Visit Diagnosis: Other symptoms and signs involving the musculoskeletal system  Other lack of coordination    Problem List Patient Active Problem List   Diagnosis Date Noted  . 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 Weakness of extremity   . Cocaine abuse   . Chronic diastolic congestive heart failure ([place])   . Smoker   . Alcohol abuse   . Hyperlipidemia   . Acute ischemic stroke ([place]) 11/03/2016  . Numbness on right side 11/02/2016  . Hypokalemia 11/02/2016  . Substance abuse 11/02/2016  . 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 Stroke [facility]) 11/02/2016   Philip Howard, OTR/L  [phone] 12/27/2016, 10:57 AM  [place] [address] [place], [place], [address] Phone: [phone]   Fax:  [phone]  Name: Philip Howard MRN: [id number] Date of Birth: 01-27-1966    OCCUPATIONAL THERAPY DISCHARGE SUMMARY  Visits from Start of Care: 6  Current functional level related to goals / functional outcomes: See above. Pt with improvements in using RUE as dominant during daily tasks; improving right grip and pinch strength and fine motor coordination.    Remaining deficits: Continues to have grip strength deficits.    Education / Equipment: Upgraded to green theraputty for grip and pinch  strengthening Plan: Patient agrees to discharge.  Patient goals were partially met. Patient is being discharged due to meeting the stated rehab goals.  ?????

## 2016-12-31 ENCOUNTER — Ambulatory Visit (HOSPITAL_BASED_OUTPATIENT_CLINIC_OR_DEPARTMENT_OTHER): Payer: Self-pay | Admitting: Physical Medicine & Rehabilitation

## 2016-12-31 ENCOUNTER — Encounter: Payer: Self-pay | Attending: Physical Medicine & Rehabilitation

## 2016-12-31 ENCOUNTER — Encounter: Payer: Self-pay | Admitting: Physical Medicine & Rehabilitation

## 2016-12-31 VITALS — BP 117/76 | HR 58

## 2016-12-31 DIAGNOSIS — Z5189 Encounter for other specified aftercare: Secondary | ICD-10-CM | POA: Insufficient documentation

## 2016-12-31 DIAGNOSIS — F1410 Cocaine abuse, uncomplicated: Secondary | ICD-10-CM | POA: Insufficient documentation

## 2016-12-31 DIAGNOSIS — R292 Abnormal reflex: Secondary | ICD-10-CM | POA: Insufficient documentation

## 2016-12-31 DIAGNOSIS — E785 Hyperlipidemia, unspecified: Secondary | ICD-10-CM | POA: Insufficient documentation

## 2016-12-31 DIAGNOSIS — Z8673 Personal history of transient ischemic attack (TIA), and cerebral infarction without residual deficits: Secondary | ICD-10-CM | POA: Insufficient documentation

## 2016-12-31 DIAGNOSIS — I69314 Frontal lobe and executive function deficit following cerebral infarction: Secondary | ICD-10-CM | POA: Insufficient documentation

## 2016-12-31 DIAGNOSIS — I69359 Hemiplegia and hemiparesis following cerebral infarction affecting unspecified side: Secondary | ICD-10-CM | POA: Insufficient documentation

## 2016-12-31 DIAGNOSIS — I69351 Hemiplegia and hemiparesis following cerebral infarction affecting right dominant side: Secondary | ICD-10-CM | POA: Insufficient documentation

## 2016-12-31 DIAGNOSIS — F17200 Nicotine dependence, unspecified, uncomplicated: Secondary | ICD-10-CM | POA: Insufficient documentation

## 2016-12-31 NOTE — Patient Instructions (Addendum)
Please Ask Dr Criss Rosales to refer you to heart Dr.  Ranee Gosselin return to driving instructions were provided. It is recommended that the patient first drives with another licensed driver in an empty parking lot. If the patient does well with this, and they can drive on a quiet street with the licensed driver. If the patient does well with this they can drive on a busy street with a licensed driver. If the patient does well with this, the next time out they can go by himself. For the first month after resuming driving, I recommend no nighttime or Interstate driving.

## 2016-12-31 NOTE — Progress Notes (Signed)
Subjective:    Patient ID: Philip Howard, male    DOB: [birth year], 51 y.o.   MRN: [id number]  HPI Quit cigarettes and EtOH, no salt or pork since May 19. Last therapy notes from 12/27/2016. Has completed outpatient therapy. Modified independent with all self-care and mobility. He still feels a little clumsy with his right hand compared to the left hand. No falls.    Pain Inventory Average Pain 7 Pain Right Now 7 My pain is tingling  In the last 24 hours, has pain interfered with the following? General activity 7 Relation with others 7 Enjoyment of life 7 What TIME of day is your pain at its worst? morning Sleep (in general) Fair  Pain is worse with: walking and standing Pain improves with: therapy/exercise Relief from Meds: 5  Mobility ability to climb steps?  yes  Function not employed: date last employed 5/18  Neuro/Psych numbness  Prior Studies Any changes since last visit?  no  Physicians involved in your care Any changes since last visit?  no   No family history on file. Social History   Social History  . Marital status: Single    Spouse name: N/A  . Number of children: N/A  . Years of education: N/A   Social History Main Topics  . Smoking status: Former Smoker    Packs/day: 0.50    Types: Cigarettes    Quit date: 11/02/2016  . Smokeless tobacco: Never Used  . Alcohol use Yes  . Drug use: Yes    Types: Marijuana  . Sexual activity: Yes   Other Topics Concern  . Not on file   Social History Narrative  . No narrative on file   Past Surgical History:  Procedure Laterality Date  . arthroscopic surgery     No past medical history on file. There were no vitals taken for this visit.  Opioid Risk Score:   Fall Risk Score:  `1  Depression screen PHQ 2/9  No flowsheet data found.   Review of Systems  Constitutional: Negative.   HENT: Negative.   Eyes: Negative.   Respiratory: Negative.   Cardiovascular: Negative.     Gastrointestinal: Negative.   Endocrine: Negative.   Genitourinary: Negative.   Musculoskeletal: Negative.   Skin: Negative.   Allergic/Immunologic: Negative.   Neurological: Negative.   Hematological: Negative.   Psychiatric/Behavioral: Negative.   All other systems reviewed and are negative.      Objective:   Physical Exam  Constitutional: He is oriented to person, place, and time. He appears well-developed and well-nourished.  HENT:  Head: Normocephalic and atraumatic.  Eyes: Pupils are equal, round, and reactive to light. Conjunctivae and EOM are normal.  Neurological: He is alert and oriented to person, place, and time.  Motor strength is 5 minus in the right grip 5 in the left grip 5, bilateral deltoid, bicep, tricep 5. Bilateral hip flexor, knee extensor, ankle dorsiflexor. Decreased fine motor finger thumb opposition on the right side has difficulty touching his fifth digit with the thumb.  Sensation normal bilateral upper and lower limbs. No dysmetria, finger-nose-finger Visual fields are intact confrontation testing. Gait is without evidence. Total Gregg or knee instability. There is no assisted device  Psychiatric: He has a normal mood and affect.  Nursing note and vitals reviewed.         Assessment & Plan:  1. Left basal ganglia infarct. Has some residual fine motor deficit and mild grip strength deficit on the right side compared to  the left side. Overall, with an excellent recovery. He is independent level. No longer needing therapy. He may return to driving on a graduated basis Graduated return to driving instructions were provided. It is recommended that the patient first drives with another licensed driver in an empty parking lot. If the patient does well with this, and they can drive on a quiet street with the licensed driver. If the patient does well with this they can drive on a busy street with a licensed driver. If the patient does well with this, the  next time out they can go by himself. For the first month after resuming driving, I recommend no nighttime or Interstate driving.   2. Patient feels like his heart is fluttering. At times, EKG, showing no evidence of arrhythmia on 521 and 11/02/2016, normal echo 11/03/2016. I discussed with the patient. He may ask his primary physician for cardiology referral.

## 2017-01-28 ENCOUNTER — Telehealth: Payer: Self-pay | Admitting: Nurse Practitioner

## 2017-01-28 NOTE — Telephone Encounter (Signed)
I have attempted to call patient several times with no return call.  Please see notes below. Per protocol we will not reach out to patient for this order to be scheduled. Order has been removed.   01/28/2017 LMOM for pt to call and update on the fincanial hardship paper work. stpegram  12/20/2016 pt will bring his tax returns to office and fill out hardship applications. stpegram 12/19/2016 LMOM for pt to call and talk about options for the finanical aide with [employer]. stpegram

## 2017-06-22 NOTE — Progress Notes (Signed)
[facility]  PATIENT: Philip Howard DOB: 09/07/1965   REASON FOR VISIT:  follow-up for stroke HISTORY FROM: Patient alone at visit    HISTORY OF PRESENT ILLNESS:FROM RECORD This is a 52 year old right-handed man who reports that he was having lunch with his girlfriend yesterday at about 1430 when he noticed the acute onset of numbness and weakness on his right side. He states that this involved the face, arm, and leg. He denies any associated vision changes, difficulty speaking, difficulty swallowing, coordination problems, gait disturbance, or left-sided symptoms. He has never had any similar prior symptoms. He presented to the [facility] emergency department where the ED noted very subtle weakness on the right side. CT scan of the head was obtained and showed no evidence of an acute stroke or other abnormality. He had a CT angiogram of the head and neck performed which demonstrated occlusion of the right vertebral artery at its origin and additional scattered areas of atherosclerotic change with some stenosis noted in both middle cerebral arteries. He was not a candidate for thrombolytic therapy given minor deficits. He has been transferred to [facility] for further evaluation and management. Neurology consultation is requested for assistance. The patient states he takes aspirin 2-3 times per week. Last known well:1430 on 11/02/16 NHISS score: 2 mRS score: 0 tPA given?: No, out of window with minor deficits. 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 2-D echo with 55-60% EF. 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 He remains on aspirin  325 mg daily secondary stroke prevention without further stroke or TIA symptoms. In addition he is on Lipitor 40 mg daily without myalgias. He is continuing to get occupational therapy however his  physical therapy has stopped. He claims he is doing home exercise program and he is joining the [facility] Blood pressure in the office today 124/74. He denies further cocaine use however he continues to smoke marijuana. He also states he stopped alcohol and tobacco after his stroke. He continues to have a little bit of numbness right hand and face. 30 day event monitoring was recommended however this is not been scheduled. He returns for reevaluation UPDATE 1/7/2019CM Philip Howard, 52 year old male returns for follow-up with history of acute subacute nonhemorrhagic infarct in May 2018.  He is currently on aspirin 325 daily for secondary stroke prevention without further stroke or TIA symptoms.  He has no bruising and no bleeding.  He remains on Lipitor without myalgias.  Primary care follows his labs.  Cardiac event monitor was ordered at his last visit however he did not follow through with giving them financial information to pay for the test.  Blood pressure in the office today 135/74.  He does complain with some dizziness after taking his blood pressure medicine in the morning.  He was advised to take it at bedtime.  He has stopped smoking, stopped cocaine and stopped marijuana.  He occasionally has a beer he denies any further neurologic symptoms.  He walks for exercise REVIEW OF SYSTEMS: Full 14 system review of systems performed and notable only for those listed, all others are neg:  Constitutional: neg  Cardiovascular: neg Ear/Nose/Throat: neg  Skin: neg Eyes: neg Respiratory: neg Gastroitestinal: neg  Hematology/Lymphatic: neg  Endocrine: neg Musculoskeletal: neg Allergy/Immunology: neg Neurological: neg Psychiatric: neg Sleep : neg   ALLERGIES: No Known Allergies  HOME MEDICATIONS: Outpatient  Medications Prior to Visit  Medication Sig Dispense Refill  . aspirin 325 MG tablet Take 1 tablet (325 mg total) by mouth daily.    Marland Kitchen atorvastatin (LIPITOR) 40 MG tablet Take 1 tablet (40 mg total) by mouth daily at 6 PM. 30 tablet 1  . methocarbamol (ROBAXIN) 500 MG tablet Take 1 tablet (500 mg total) by mouth every 8 (eight) hours as needed for muscle spasms. 30 tablet 0  . Metoprolol Succinate 25 MG CS24 Take by mouth.     No facility-administered medications prior to visit.     PAST MEDICAL HISTORY: History reviewed. No pertinent past medical history.  PAST SURGICAL HISTORY: Past Surgical History:  Procedure Laterality Date  . arthroscopic surgery      FAMILY HISTORY: Family History  Problem Relation Age of Onset  . Cancer Mother   . Heart attack Father     SOCIAL HISTORY: Social History   Socioeconomic History  . Marital status: Single    Spouse name: Not on file  . Number of children: Not on file  . Years of education: Not on file  . Highest education level: Not on file  Social Needs  . Financial resource strain: Not on file  . Food insecurity - worry: Not on file  . Food insecurity - inability: Not on file  . Transportation needs - medical: Not on file  . Transportation needs - non-medical: Not on file  Occupational History  . Not on file  Tobacco Use  . Smoking status: Former Smoker    Packs/day: 0.50    Types: Cigarettes    Last attempt to quit: 11/02/2016    Years since quitting: 0.6  . Smokeless tobacco: Never Used  Substance and Sexual Activity  . Alcohol use: Yes  . Drug use: Yes    Types: Marijuana  . Sexual activity: Yes  Other Topics Concern  . Not on file  Social History Narrative  . Not on file     PHYSICAL EXAM  Vitals:   06/23/17 0945  BP: 135/74  Pulse: 81  Weight: 202 lb (91.6 kg)   Body mass index is 33.61 kg/m.  Generalized: Well developed, Obese male in no acute distress  Head: normocephalic and atraumatic,. Oropharynx  benign  Neck: Supple, no carotid bruits  Cardiac: Regular rate rhythm, no murmur  Musculoskeletal: No deformity   Neurological examination   Mentation: Alert oriented to time, place, history taking. Attention span and concentration appropriate. Recent and remote memory intact.  Follows all commands speech and language fluent.   Cranial nerve II-XII: .Pupils were equal round reactive to light extraocular movements were full, visual field were full on confrontational test. Facial sensation decreased on the right and strength were normal. hearing was intact to finger rubbing bilaterally. Uvula tongue midline. head turning and shoulder shrug were normal and symmetric.Tongue protrusion into cheek strength was normal. Motor: normal bulk and tone, full strength in the BUE, BLE, Sensory: normal and symmetric to light touch, pinprick, and  Vibration, in the upper and lower extremities Coordination: Normal finger-nose-finger, no dysmetria no tremor Reflexes: Symmetric upper and lower plantar responses were flexor bilaterally. Gait and Station: Rising up from seated position without assistance, normal stance,  moderate stride, good arm swing, smooth turning, able to perform tiptoe, and heel walking without difficulty. Tandem gait is mildly un steady.  No assistive device  DIAGNOSTIC DATA (LABS, IMAGING, TESTING) - I reviewed patient records, labs, notes, testing and imaging myself where available.  Lab Results  Component Value Date   WBC 6.7 11/06/2016   HGB 14.9 11/06/2016   HCT 45.0 11/06/2016   MCV 91.6 11/06/2016   PLT 257 11/06/2016      Component Value Date/Time   NA 135 11/09/2016 0436   K 3.6 11/09/2016 0436   CL 104 11/09/2016 0436   CO2 21 (L) 11/09/2016 0436   GLUCOSE 110 (H) 11/09/2016 0436   BUN 26 (H) 11/09/2016 0436   CREATININE 1.22 11/09/2016 0436   CALCIUM 9.5 11/09/2016 0436   PROT 6.8 11/06/2016 0535   ALBUMIN 3.9 11/06/2016 0535   AST 49 (H) 11/06/2016 0535   ALT 61  11/06/2016 0535   ALKPHOS 62 11/06/2016 0535   BILITOT 0.8 11/06/2016 0535   GFRNONAA >60 11/09/2016 0436   GFRAA >60 11/09/2016 0436   Lab Results  Component Value Date   CHOL 283 (H) 11/04/2016   HDL 39 (L) 11/04/2016   LDLCALC 166 (H) 11/04/2016   TRIG 388 (H) 11/04/2016   CHOLHDL 7.3 11/04/2016   Lab Results  Component Value Date   HGBA1C 5.7 (H) 11/03/2016    ASSESSMENT AND PLAN  52 y.o. 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    PLAN: Stressed the importance of management of risk factors to prevent further stroke Continue aspirin for secondary stroke prevention Maintain strict control of hypertension with blood pressure goal below 130/90, today's reading 135/74 Take blood pressure medicine at night to see if dizziness goes away Will get report 30 day event monitor Control of diabetes with hemoglobin A1c below 6.5 followed by primary care  Cholesterol with LDL cholesterol less than 70, followed by primary care,   continue statin drugs Lipitor Continue daily exercise at least 30 minutes  eat healthy diet with whole grains,  fresh fruits and vegetables Patient has stopped smoking and cocaine, still has occasional alcohol follow up with [place] in 6 months I spent 25 minutes in total face to face time with the patient more than 50% of which was spent counseling and coordination of care, reviewing test results reviewing medications and discussing and reviewing the diagnosis of stroke and management of risk factors Late entry According to cardiology pt would not give them financial information needed for cardiac monitoring so they cancelled the test.  Dennie Bible, [facility], [facility], APRN  [facility] [address] [place], [place] [address] [phone]

## 2017-06-23 ENCOUNTER — Ambulatory Visit: Payer: Self-pay | Admitting: Nurse Practitioner

## 2017-06-23 ENCOUNTER — Encounter: Payer: Self-pay | Admitting: Nurse Practitioner

## 2017-06-23 ENCOUNTER — Telehealth: Payer: Self-pay | Admitting: *Deleted

## 2017-06-23 VITALS — BP 135/74 | HR 81 | Wt 202.0 lb

## 2017-06-23 DIAGNOSIS — E782 Mixed hyperlipidemia: Secondary | ICD-10-CM

## 2017-06-23 DIAGNOSIS — I639 Cerebral infarction, unspecified: Secondary | ICD-10-CM

## 2017-06-23 NOTE — Telephone Encounter (Signed)
LVM requesting patient call back for information this RN needs to give him.

## 2017-06-23 NOTE — Progress Notes (Signed)
I reviewed above note and agree with the assessment and plan.  Rosalin Hawking, MD PhD Stroke Neurology 06/23/2017 10:00 PM

## 2017-06-23 NOTE — Patient Instructions (Signed)
Stressed the importance of management of risk factors to prevent further stroke Continue aspirin for secondary stroke prevention Maintain strict control of hypertension with blood pressure goal below 130/90, today's reading 135/74 Take blood pressure medicine at night to see if dizziness goes away Will get report 30 day event monitor Control of diabetes with hemoglobin A1c below 6.5 followed by primary care  Cholesterol with LDL cholesterol less than 70, followed by primary care,   continue statin drugs Lipitor Continue daily exercise at least 30 minutes  eat healthy diet with whole grains,  fresh fruits and vegetables Patient has stopped smoking and cocaine, still has occasional alcohol follow up with [place] in 6 months

## 2017-06-24 NOTE — Telephone Encounter (Signed)
Can you put the order in, is for a 30-day event monitor to rule out atrial fib

## 2017-06-24 NOTE — Telephone Encounter (Signed)
lLVM on home phone requesting call back. Called mobile number and spoke with patient. Inquired if he wanted to get cardiac event monitor. Advised he would have to give cardiologist's office the necessary information so that he can receive financial aid. He stated if he can get help he wants to do it. This RN advised will have [place] referral for monitor. Advised he must give cardiologist's office the necessary information when they call requesting it.  He verbalized understanding, agreement.

## 2017-06-24 NOTE — Addendum Note (Signed)
Addended by: Minna Antis on: 06/24/2017 04:43 PM   Modules accepted: Orders

## 2017-06-27 NOTE — Telephone Encounter (Signed)
Received call from patient stating he got VM from office about his monitor but he couldn't understand the message. He asked who to call. This RN gave him the number to [place] [facility] and advised he call them to schedule getting the cardiac monitor. He verbalized understanding, appreciation.

## 2017-06-30 ENCOUNTER — Other Ambulatory Visit: Payer: Self-pay | Admitting: Nurse Practitioner

## 2017-06-30 DIAGNOSIS — I4891 Unspecified atrial fibrillation: Secondary | ICD-10-CM

## 2017-06-30 DIAGNOSIS — I639 Cerebral infarction, unspecified: Secondary | ICD-10-CM

## 2017-07-04 ENCOUNTER — Ambulatory Visit (INDEPENDENT_AMBULATORY_CARE_PROVIDER_SITE_OTHER): Payer: Self-pay

## 2017-07-04 DIAGNOSIS — I4891 Unspecified atrial fibrillation: Secondary | ICD-10-CM

## 2017-07-04 DIAGNOSIS — I639 Cerebral infarction, unspecified: Secondary | ICD-10-CM

## 2017-07-21 ENCOUNTER — Emergency Department (HOSPITAL_COMMUNITY): Payer: Self-pay

## 2017-07-21 ENCOUNTER — Emergency Department (HOSPITAL_COMMUNITY)
Admission: EM | Admit: 2017-07-21 | Discharge: 2017-07-21 | Disposition: A | Discharge status: Home / Self Care | Payer: Self-pay | Attending: Emergency Medicine | Admitting: Emergency Medicine

## 2017-07-21 ENCOUNTER — Other Ambulatory Visit: Payer: Self-pay

## 2017-07-21 ENCOUNTER — Encounter (HOSPITAL_COMMUNITY): Payer: Self-pay | Admitting: Emergency Medicine

## 2017-07-21 DIAGNOSIS — R42 Dizziness and giddiness: Secondary | ICD-10-CM | POA: Insufficient documentation

## 2017-07-21 DIAGNOSIS — F1410 Cocaine abuse, uncomplicated: Secondary | ICD-10-CM | POA: Insufficient documentation

## 2017-07-21 DIAGNOSIS — F1293 Cannabis use, unspecified with withdrawal: Secondary | ICD-10-CM | POA: Insufficient documentation

## 2017-07-21 DIAGNOSIS — Z7982 Long term (current) use of aspirin: Secondary | ICD-10-CM | POA: Insufficient documentation

## 2017-07-21 DIAGNOSIS — I5032 Chronic diastolic (congestive) heart failure: Secondary | ICD-10-CM | POA: Insufficient documentation

## 2017-07-21 DIAGNOSIS — Z8673 Personal history of transient ischemic attack (TIA), and cerebral infarction without residual deficits: Secondary | ICD-10-CM | POA: Insufficient documentation

## 2017-07-21 DIAGNOSIS — Z87891 Personal history of nicotine dependence: Secondary | ICD-10-CM | POA: Insufficient documentation

## 2017-07-21 HISTORY — DX: Cerebral infarction, unspecified: I63.9

## 2017-07-21 LAB — CBG MONITORING, ED: GLUCOSE-CAPILLARY: 102 mg/dL — AB (ref 65–99)

## 2017-07-21 LAB — DIFFERENTIAL
BASOS ABS: 0 10*3/uL (ref 0.0–0.1)
Basophils Relative: 0 %
EOS ABS: 0.1 10*3/uL (ref 0.0–0.7)
EOS PCT: 2 %
LYMPHS PCT: 36 %
Lymphs Abs: 2.2 10*3/uL (ref 0.7–4.0)
Monocytes Absolute: 0.5 10*3/uL (ref 0.1–1.0)
Monocytes Relative: 8 %
NEUTROS PCT: 54 %
Neutro Abs: 3.2 10*3/uL (ref 1.7–7.7)

## 2017-07-21 LAB — COMPREHENSIVE METABOLIC PANEL
ALBUMIN: 4.1 g/dL (ref 3.5–5.0)
ALT: 97 U/L — ABNORMAL HIGH (ref 17–63)
AST: 54 U/L — AB (ref 15–41)
Alkaline Phosphatase: 58 U/L (ref 38–126)
Anion gap: 9 (ref 5–15)
BUN: 26 mg/dL — AB (ref 6–20)
CO2: 22 mmol/L (ref 22–32)
Calcium: 9.3 mg/dL (ref 8.9–10.3)
Chloride: 107 mmol/L (ref 101–111)
Creatinine, Ser: 1.18 mg/dL (ref 0.61–1.24)
GFR calc Af Amer: >60 mL/min (ref >60)
GFR calc non Af Amer: >60 mL/min (ref >60)
GLUCOSE: 109 mg/dL — AB (ref 65–99)
POTASSIUM: 3.9 mmol/L (ref 3.5–5.1)
SODIUM: 138 mmol/L (ref 135–145)
Total Bilirubin: 0.2 mg/dL — ABNORMAL LOW (ref 0.3–1.2)
Total Protein: 6.9 g/dL (ref 6.5–8.1)

## 2017-07-21 LAB — RAPID URINE DRUG SCREEN, HOSP PERFORMED
Amphetamines: NOT DETECTED
BARBITURATES: NOT DETECTED
Benzodiazepines: NOT DETECTED
Cocaine: NOT DETECTED
Opiates: NOT DETECTED
TETRAHYDROCANNABINOL: POSITIVE — AB

## 2017-07-21 LAB — I-STAT CHEM 8, ED
BUN: 26 mg/dL — AB (ref 6–20)
CHLORIDE: 105 mmol/L (ref 101–111)
Calcium, Ion: 1.18 mmol/L (ref 1.15–1.40)
Creatinine, Ser: 1.1 mg/dL (ref 0.61–1.24)
Glucose, Bld: 107 mg/dL — ABNORMAL HIGH (ref 65–99)
HCT: 43 % (ref 39.0–52.0)
Hemoglobin: 14.6 g/dL (ref 13.0–17.0)
POTASSIUM: 3.9 mmol/L (ref 3.5–5.1)
SODIUM: 139 mmol/L (ref 135–145)
TCO2: 24 mmol/L (ref 22–32)

## 2017-07-21 LAB — APTT: APTT: 24 s (ref 24–36)

## 2017-07-21 LAB — I-STAT TROPONIN, ED: Troponin i, poc: 0.01 ng/mL (ref 0.00–0.08)

## 2017-07-21 LAB — URINALYSIS, ROUTINE W REFLEX MICROSCOPIC
Bilirubin Urine: NEGATIVE
Glucose, UA: NEGATIVE mg/dL
Hgb urine dipstick: NEGATIVE
KETONES UR: NEGATIVE mg/dL
LEUKOCYTES UA: NEGATIVE
NITRITE: NEGATIVE
PROTEIN: NEGATIVE mg/dL
Specific Gravity, Urine: 1.013 (ref 1.005–1.030)
pH: 5 (ref 5.0–8.0)

## 2017-07-21 LAB — CBC
HCT: 41.7 % (ref 39.0–52.0)
Hemoglobin: 13.4 g/dL (ref 13.0–17.0)
MCH: 29.3 pg (ref 26.0–34.0)
MCHC: 32.1 g/dL (ref 30.0–36.0)
MCV: 91.2 fL (ref 78.0–100.0)
PLATELETS: 250 10*3/uL (ref 150–400)
RBC: 4.57 MIL/uL (ref 4.22–5.81)
RDW: 15.2 % (ref 11.5–15.5)
WBC: 6 10*3/uL (ref 4.0–10.5)

## 2017-07-21 LAB — ETHANOL: Alcohol, Ethyl (B): <10 mg/dL (ref <10)

## 2017-07-21 LAB — PROTIME-INR
INR: 0.88
PROTHROMBIN TIME: 11.9 s (ref 11.4–15.2)

## 2017-07-21 MED ORDER — SODIUM CHLORIDE 0.9 % IV BOLUS (SEPSIS)
500.0000 mL | Freq: Once | INTRAVENOUS | Status: AC
  Administered 2017-07-21: 500 mL via INTRAVENOUS

## 2017-07-21 NOTE — Progress Notes (Signed)
CALL FROM RN Stanley

## 2017-07-21 NOTE — ED Triage Notes (Signed)
Pt c/o dizziness since waking from a nap at 1100 today, fell asleep at 1000. Had a stroke May 2018 with R sided weakness. Pt alert and oriented, no slurred speech, no unilateral weakness, or difficulty ambulating. Ambulatory to triage with steady and even gait. Denies hx of vertigo. Dr. Jeneen Rinks in to see at triage.

## 2017-07-21 NOTE — ED Provider Notes (Signed)
[facility] EMERGENCY DEPARTMENT Provider Note   CSN: [id number] Arrival date & time: 07/21/17  1429   An emergency department physician performed an initial assessment on this suspected stroke patient at 1452.  History   Chief Complaint Chief Complaint  Patient presents with  . Code Stroke    HPI Philip Howard is a 52 y.o. male.  Chief complaint is dizziness.  HPI 52 year old male.  States that he awakened this morning and was normal.  At 10:00 he went to bed.  He awakened 11.  When he stood up he felt "dizzy".  When asked him to further describe this he states "woozy".  He has difficulty describing if this is a lightheaded or presyncopal feeling.  He denies that this feels like spinning.  It is exacerbated with head movements even when sitting if he looks to his left to his right.  However, is most easily reproduced with going from sitting to standing.  He has a history of CVA.  Last May right sided symptoms.  ER evaluation showed normal CT, CT angios showed right vertebral artery occlusion.  MRI showed left internal capsule infarct.  He has not had any recurrence of episodes.  However, to complete his Stroke evaluation he is just now wearing Holter monitor.  He had it on during these events today.  No unilateral weakness.  No difficulty with speech or swallowing.  No vision changes.  No palpitations.  No chest pain.  He takes aspirin, atorvastatin, and metoprolol 50 mg.  He started taking his metoprolol at night several days ago at the advice of his primary care physician.  Past Medical History:  Diagnosis Date  . Stroke [facility]) 10/2016    Patient Active Problem List   Diagnosis Date Noted  . History of stroke 12/31/2016  . Hemiparesis affecting dominant side as late effect of cerebrovascular accident (CVA) ([place]) 12/31/2016  . 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 Weakness of extremity   . Cocaine abuse ([place])   . Chronic diastolic congestive heart failure ([place])    . Smoker   . Alcohol abuse   . Hyperlipidemia   . Acute ischemic stroke ([place]) 11/03/2016  . Numbness on right side 11/02/2016  . Hypokalemia 11/02/2016  . Substance abuse ([place]) 11/02/2016  . 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 Stroke [facility]) 11/02/2016    Past Surgical History:  Procedure Laterality Date  . arthroscopic surgery         Home Medications    Prior to Admission medications   Medication Sig Start Date End Date Taking? Authorizing Provider  aspirin 325 MG tablet Take 1 tablet (325 mg total) by mouth daily. 11/06/16   Mariel Aloe, MD  atorvastatin (LIPITOR) 40 MG tablet Take 1 tablet (40 mg total) by mouth daily at 6 PM. 11/12/16   Angiulli, Lavon Paganini, PA-C  methocarbamol (ROBAXIN) 500 MG tablet Take 1 tablet (500 mg total) by mouth every 8 (eight) hours as needed for muscle spasms. 11/12/16   Angiulli, Lavon Paganini, PA-C  Metoprolol Succinate 25 MG CS24 Take by mouth.    [provider]    Family History Family History  Problem Relation Age of Onset  . Cancer Mother   . Heart attack Father     Social History Social History   Tobacco Use  . Smoking status: Former Smoker    Packs/day: 0.50    Types: Cigarettes    Last attempt to quit: 11/02/2016    Years since quitting: 0.7  .  Smokeless tobacco: Never Used  Substance Use Topics  . Alcohol use: Yes  . Drug use: Yes    Types: Marijuana     Allergies   Patient has no known allergies.   Review of Systems Review of Systems  Constitutional: Negative for appetite change, chills, diaphoresis, fatigue and fever.  HENT: Negative for mouth sores, sore throat and trouble swallowing.   Eyes: Negative for visual disturbance.  Respiratory: Negative for cough, chest tightness, shortness of breath and wheezing.   Cardiovascular: Negative for chest pain.  Gastrointestinal: Negative for abdominal distention, abdominal pain, diarrhea, nausea and vomiting.  Endocrine: Negative for polydipsia,  polyphagia and polyuria.  Genitourinary: Negative for dysuria, frequency and hematuria.  Musculoskeletal: Negative for gait problem.  Skin: Negative for color change, pallor and rash.  Neurological: Positive for dizziness. Negative for syncope, light-headedness and headaches.  Hematological: Does not bruise/bleed easily.  Psychiatric/Behavioral: Negative for behavioral problems and confusion.     Physical Exam Updated Vital Signs BP 136/84   Pulse (!) 55   Temp 98.4 F (36.9 C) (Oral)   Resp (!) 21   Wt 92.5 kg (204 lb)   SpO2 98%   BMI 33.95 kg/m   Physical Exam  Constitutional: He is oriented to person, place, and time. He appears well-developed and well-nourished. No distress.  HENT:  Head: Normocephalic.  Eyes: Conjunctivae are normal. Pupils are equal, round, and reactive to light. No scleral icterus.  Neck: Normal range of motion. Neck supple. No thyromegaly present.  Cardiovascular: Normal rate and regular rhythm. Exam reveals no gallop and no friction rub.  No murmur heard. Pulmonary/Chest: Effort normal and breath sounds normal. No respiratory distress. He has no wheezes. He has no rales.  Abdominal: Soft. Bowel sounds are normal. He exhibits no distension. There is no tenderness. There is no rebound.  Musculoskeletal: Normal range of motion.  Neurological: He is alert and oriented to person, place, and time.  No obvious cranial nerve deficits.  He does not have inducible nystagmus.  However with lateral collateralized movement, head moving, or standing he describes a feeling of dizziness.  No pronator drift.  No foot drop.  Skin: Skin is warm and dry. No rash noted.  Psychiatric: He has a normal mood and affect. His behavior is normal.     ED Treatments / Results  Labs (all labs ordered are listed, but only abnormal results are displayed) Labs Reviewed  COMPREHENSIVE METABOLIC PANEL - Abnormal; Notable for the following components:      Result Value    Glucose, Bld 109 (*)    BUN 26 (*)    AST 54 (*)    ALT 97 (*)    Total Bilirubin 0.2 (*)    All other components within normal limits  RAPID URINE DRUG SCREEN, HOSP PERFORMED - Abnormal; Notable for the following components:   Tetrahydrocannabinol POSITIVE (*)    All other components within normal limits  URINALYSIS, ROUTINE W REFLEX MICROSCOPIC - Abnormal; Notable for the following components:   Color, Urine STRAW (*)    All other components within normal limits  CBG MONITORING, ED - Abnormal; Notable for the following components:   Glucose-Capillary 102 (*)    All other components within normal limits  I-STAT CHEM 8, ED - Abnormal; Notable for the following components:   BUN 26 (*)    Glucose, Bld 107 (*)    All other components within normal limits  PROTIME-INR  APTT  CBC  DIFFERENTIAL  ETHANOL  I-STAT  TROPONIN, ED    EKG  EKG Interpretation None       Radiology Mr Brain Wo Contrast  Result Date: 07/21/2017 CLINICAL DATA:  Ataxia, stroke EXAM: MRI HEAD WITHOUT CONTRAST TECHNIQUE: Multiplanar, multiecho pulse sequences of the brain and surrounding structures were obtained without intravenous contrast. COMPARISON:  MRI head 11/03/2016, CT 07/21/2017 FINDINGS: Brain: Negative for acute infarct. Mild chronic ischemic changes in the white matter including posterior limb internal capsule on the left. Ventricle size normal.  Negative for mass or edema The patient was not able to complete the study due to claustrophobia. Vascular: Normal arterial flow voids Skull and upper cervical spine: Negative Sinuses/Orbits: Mild mucosal edema paranasal sinuses.  Normal orbit Other: None IMPRESSION: No acute abnormality.  Mild chronic microvascular ischemia Patient could not complete the study due to claustrophobia. Majority of the sequences were obtained. Electronically Signed   By: Franchot Gallo M.D.   On: 07/21/2017 16:06   Ct Head Code Stroke Wo Contrast  Result Date: 07/21/2017 CLINICAL  DATA:  Code stroke. Focal neuro deficit of less than 6 hours, stroke suspected. Previous infarct 10/2016. New onset dizziness after waking from a nap 4 hours ago. EXAM: CT HEAD WITHOUT CONTRAST TECHNIQUE: Contiguous axial images were obtained from the base of the skull through the vertex without intravenous contrast. COMPARISON:  A remote infarct involving the posterior left lentiform nucleus and internal capsule FINDINGS: Brain: A remote infarct involving the posterior left lentiform nucleus and internal capsule is stable. No acute cortical infarct, hemorrhage, or mass lesion is present. Ventricles are of normal size. White matter is otherwise normal. No significant extra-axial fluid collection is present. The brainstem and cerebellum are within normal limits. Vascular: No hyperdense vessel or unexpected calcification. Skull: Calvarium is intact. No focal lytic or blastic lesions are present. No significant extracranial soft tissue injury is present. Sinuses/Orbits: The paranasal sinuses and mastoid air cells are clear. The globes and orbits are within normal limits. ASPECTS [facility] Stroke Program Early CT Score) - Ganglionic level infarction (caudate, lentiform nuclei, internal capsule, insula, M1-M3 cortex): 7/7 - Supraganglionic infarction (M4-M6 cortex): 3/3 Total score (0-10 with 10 being normal): 10/10 IMPRESSION: 1. No acute intracranial abnormality. 2. Stable remote infarct involving the posterior left lentiform nucleus and posterior limb internal capsule. 3. ASPECTS is 10/10 These results were called by telephone at the time of interpretation on 07/21/2017 at 3:14 pm to Dr. Jeneen Rinks, who verbally acknowledged these results. Electronically Signed   By: San Morelle M.D.   On: 07/21/2017 15:14    Procedures Procedures (including critical care time)  Medications Ordered in ED Medications  sodium chloride 0.9 % bolus 500 mL (500 mLs Intravenous New Bag/Given 07/21/17 1530)     Initial Impression  / Assessment and Plan / ED Course  I have reviewed the triage vital signs and the nursing notes.  Pertinent labs & imaging results that were available during my care of the patient were reviewed by me and considered in my medical decision making (see chart for details).    After review of patient's chart and noting previous idiopathic right vertebral artery occlusion I did initiate code stroke.  Consulting neurologist felt this was likely presyncope and not neurological.  MRI confirms normal brain.  I had long discussion with patient upon arrival.  He was within 1/2-hour of the window for possible TPA therapy.  My concern at that time was for complete posterior circulation occlusion.  This is not evident by exam or imaging.  I  think he is appropriate for discharge home.  His EKG shows sinus rhythm.  His labs are reassuring.  He was given half liter fluid.  He is asymptomatic.  I advised him to increase his fluid intake.  Hold his metoprolol tonight.  Contact primary care if his symptoms persist or recur to discuss medication changes or further interventions.  CRITICAL CARE Performed by: Lolita Patella   Total critical care time: 30 minutes I was asked by nursing to see this patient at triage and I left the care of another goal patient to assume his care.  Also multiple consultations with radiology, and telemetry neurology as above.  Decision making involving discussion with neurologist and patient regarding possibility of use of TPA.  Patient is not a TPA candidate as this was not thought to represent acute CVA.  Critical care time was exclusive of separately billable procedures and treating other patients.  Critical care was necessary to treat or prevent imminent or life-threatening deterioration.  Critical care was time spent personally by me on the following activities: development of treatment plan with patient and/or surrogate as well as nursing, discussions with consultants, evaluation  of patient's response to treatment, examination of patient, obtaining history from patient or surrogate, ordering and performing treatments and interventions, ordering and review of laboratory studies, ordering and review of radiographic studies, pulse oximetry and re-evaluation of patient's condition.   Final Clinical Impressions(s) / ED Diagnoses   Final diagnoses:  Dizziness    ED Discharge Orders    None       Tanna Furry, MD 07/21/17 1624

## 2017-07-21 NOTE — ED Notes (Signed)
Patient transported to MRI 

## 2017-07-21 NOTE — Discharge Instructions (Signed)
Do not take your blood pressure medication tonight. Increase your daily fluid intake. If your dizziness persists, see your primary care physician to discuss possible medication changes.

## 2017-07-21 NOTE — ED Notes (Signed)
Tele[facility] on Camera at this time.

## 2017-07-21 NOTE — Consult Note (Addendum)
TeleNeurology Consult Note  Apolonio Schneiders     Consulting Physician: Clabe Seal Code Status: Prior Primary Care Physician:  Lucianne Lei, MD  DOB:  1965-09-03   Age: 52 y.o.  Date of Service: July 21, 2017 Admit Date:  07/21/2017   Admitting Diagnosis: <principal problem not specified>  Subjective:  Reason for Consultation: Stroke  Chief Complaint: lightheadedness  History of present illness: 52 y/o man with h/o stroke (redisual right sided numbness) who presents with lightheadedness since 1100. He denies vertigo. Symptoms worse when standing up quickly. Emergent telestroke consult requested. CT head reviewed and case discussed with ED staff. NIHSS 1 for numbness. On metoprolol and is HR is in the 50s at this time.    Review of Systems  Patient Active Problem List   Diagnosis Date Noted  . History of stroke 12/31/2016  . Hemiparesis affecting dominant side as late effect of cerebrovascular accident (CVA) ([place]) 12/31/2016  . 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 Weakness of extremity   . Cocaine abuse ([place])   . Chronic diastolic congestive heart failure ([place])   . Smoker   . Alcohol abuse   . Hyperlipidemia   . Acute ischemic stroke ([place]) 11/03/2016  . Numbness on right side 11/02/2016  . Hypokalemia 11/02/2016  . Substance abuse ([place]) 11/02/2016  . 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 Stroke [facility]) 11/02/2016   Past Medical History:  Diagnosis Date  . Stroke [facility]) 10/2016   Past Surgical History:  Procedure Laterality Date  . arthroscopic surgery     No Known Allergies  Social History   Socioeconomic History  . Marital status: Single    Spouse name: Not on file  . Number of children: Not on file  . Years of education: Not on file  . Highest education level: Not on file  Social Needs  . Financial resource strain: Not on file  . Food insecurity - worry: Not on file  . Food insecurity - inability: Not on file  . Transportation  needs - medical: Not on file  . Transportation needs - non-medical: Not on file  Occupational History  . Not on file  Tobacco Use  . Smoking status: Former Smoker    Packs/day: 0.50    Types: Cigarettes    Last attempt to quit: 11/02/2016    Years since quitting: 0.7  . Smokeless tobacco: Never Used  Substance and Sexual Activity  . Alcohol use: Yes  . Drug use: Yes    Types: Marijuana  . Sexual activity: Yes  Other Topics Concern  . Not on file  Social History Narrative  . Not on file   Family History Family History  Problem Relation Age of Onset  . Cancer Mother   . Heart attack Father       Objective:  Vital signs in last 24 hours: Temp:  [98.4 F (36.9 C)] 98.4 F (36.9 C) (02/04 1455) Pulse Rate:  [55-64] 55 (02/04 1518) Resp:  [16-17] 17 (02/04 1518) BP: (121-151)/(95-101) 121/101 (02/04 1512) SpO2:  [98 %-99 %] 98 % (02/04 1518) Weight:  [92.5 kg (204 lb)] 92.5 kg (204 lb) (02/04 1502) Temp (24hrs), Avg:98.4 F (36.9 C), Min:98.4 F (36.9 C), Max:98.4 F (36.9 C)    Intake/Output last 3 shifts: No intake/output data recorded.  Intake/Output this shift: No intake/output data recorded.  Physical Exam 1a- LOC: Keenly responsive - 0      1b- LOC questions: Answers both questions correctly - 0  1c- LOC commands- Performs both tasks correctly- 0     2- Gaze: Normal; no gaze paresis or gaze deviation - 0     3- Visual Fields: normal, no Visual field deficit - 0     4- Facial movements: no facial palsy - 0     5a- Right Upper limb motor - no drift -0     5b -Left Upper limb motor - no drift -0     6a- Right Lower limb motor - no drift - 0      6b- Left Lower limb motor - no drift - 0 7- Limb Coordination: absent ataxia - 0      8- Sensory : no sensory loss - 0      9- Language - No aphasia - 0      10- Speech - No dysarthria -0     11- Neglect / Extinction - none found -0     NIHSS score   1   Diagnostic Findings:  Pertinent Labs: No results  for input(s): WBC, MCV in the last 168 hours.  Invalid input(s): HEMATOCRIT, HEMOGLOBIN, PLATELETS No results for input(s): POTASSIUM, CHLORIDE, CALCIUM, BUN, GLUCOSE, CREATININE, CO2 in the last 168 hours.  Invalid input(s): SODIUM, ANION GAP2, GFR MDRD AF AMER No results for input(s): AST, ALT, ALBUMIN in the last 168 hours.  Invalid input(s): ALK PHOS, BILIRUBIN TOTAL, BILIRUBIN DIRECT, PROTEIN TOTAL, GLOBULIN No results for input(s): TRIG in the last 168 hours.  Invalid input(s): CHLPL, HDL PML, VLDL CALC, LDL CALC, CHOL/HDL RATIO, LDL/HDL RATIO, NON-HDL CHOLESTEROL Invalid input(s): CK TOTAL, TROPONIN I, CK MB No results for input(s): APTT in the last 168 hours. No results for input(s): INR in the last 168 hours.  Invalid input(s): PROTHROMBIN TIME  Extensive review of previous medical records completed.    Assessment: Patient Active Problem List   Diagnosis Date Noted  . History of stroke 12/31/2016  . Hemiparesis affecting dominant side as late effect of cerebrovascular accident (CVA) ([place]) 12/31/2016  . 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 Weakness of extremity   . Cocaine abuse ([place])   . Chronic diastolic congestive heart failure ([place])   . Smoker   . Alcohol abuse   . Hyperlipidemia   . Acute ischemic stroke ([place]) 11/03/2016  . Numbness on right side 11/02/2016  . Hypokalemia 11/02/2016  . Substance abuse ([place]) 11/02/2016  . 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 Stroke [facility]) 11/02/2016     Plan: [facility] TeleNeurology Consult Services  Impression: Presyncope vs Recrudescence     Differential Diagnosis:  1. Cardioembolic stroke  2. Small vessel disease/lacune    3. Thromboembolic, artery-to-artery mechanism 4. Hypercoagulable state-related infarct  5. Thrombotic mechanism, large artery disease 6. Transient ischemic attack  Comments: Last known well:   1100 Door time:1505 [facility] contacted:   0932 [facility]  at bedside:   1521 NIHSS assessment time:1521 CT head reviewed at 1525 Needle time:TPA not considered since NIHSS 1 from chronic numbness Thrombectomy not considered since large vessel occlusion is not suspected     Discussion:     Our recommendations are outlined below.  Recommendations: Presyncope work-up. Check orthostatics Toxic, metabolic and infectious work-up ASA, IV fluids and permissive hypertension (Keep SBP < 220/110) Neurochecks DVT prophylaxis    Follow up with Neurology for further testing and evaluation  Medical Decision Making:  - Extensive number of diagnosis or management options are considered above. - Extensive amount of complex data reviewed.  - High risk of  complication and/or morbidity or mortality are associated with differential diagnostic considerations above. - There may be uncertain outcome and increased probability of prolonged functional impairment or high probability of severe prolonged functional impairment associated with some of these differential diagnosis.  Medical Data Reviewed: 1.Data reviewed include clinical labs, radiology, Medical Tests; 2.Tests results discussed w/performing or interpreting physician;    3.Obtaining/reviewing old medical records; 4.Obtaining case history from another source;    5.Independent review of image, tracing or specimen.  Signed:  07/21/2017  3:26 PM

## 2017-08-21 ENCOUNTER — Telehealth: Payer: Self-pay

## 2017-08-21 NOTE — Telephone Encounter (Signed)
Notes recorded by Marval Regal, RN on 08/21/2017 at 1:53 PM EST Rn call patient that the heart monitor was negative for irregular heart beat. Continue treatment plan. PT verbalized understanding.

## 2017-08-21 NOTE — Telephone Encounter (Signed)
-----   Message from Rosalin Hawking, MD sent at 08/21/2017  9:37 AM EST ----- Could you please let the patient know that the heart monitoring test done recently was negative for irregular heart beats. Please continue current treatment. Thanks.  Rosalin Hawking, MD PhD Stroke Neurology 08/21/2017 9:37 AM

## 2017-12-17 NOTE — Progress Notes (Deleted)
[facility]  PATIENT: Philip Howard DOB: 09/07/1965   REASON FOR VISIT:  follow-up for stroke HISTORY FROM: Patient alone at visit    HISTORY OF PRESENT ILLNESS:FROM RECORD This is a 52 year old right-handed man who reports that he was having lunch with his girlfriend yesterday at about 1430 when he noticed the acute onset of numbness and weakness on his right side. He states that this involved the face, arm, and leg. He denies any associated vision changes, difficulty speaking, difficulty swallowing, coordination problems, gait disturbance, or left-sided symptoms. He has never had any similar prior symptoms. He presented to the [facility] emergency department where the ED noted very subtle weakness on the right side. CT scan of the head was obtained and showed no evidence of an acute stroke or other abnormality. He had a CT angiogram of the head and neck performed which demonstrated occlusion of the right vertebral artery at its origin and additional scattered areas of atherosclerotic change with some stenosis noted in both middle cerebral arteries. He was not a candidate for thrombolytic therapy given minor deficits. He has been transferred to [facility] for further evaluation and management. Neurology consultation is requested for assistance. The patient states he takes aspirin 2-3 times per week. Last known well:1430 on 11/02/16 NHISS score: 2 mRS score: 0 tPA given?: No, out of window with minor deficits. 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 2-D echo with 55-60% EF. 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 He remains on aspirin  325 mg daily secondary stroke prevention without further stroke or TIA symptoms. In addition he is on Lipitor 40 mg daily without myalgias. He is continuing to get occupational therapy however his  physical therapy has stopped. He claims he is doing home exercise program and he is joining the [facility] Blood pressure in the office today 124/74. He denies further cocaine use however he continues to smoke marijuana. He also states he stopped alcohol and tobacco after his stroke. He continues to have a little bit of numbness right hand and face. 30 day event monitoring was recommended however this is not been scheduled. He returns for reevaluation UPDATE 1/7/2019CM Philip Howard, 52 year old male returns for follow-up with history of acute subacute nonhemorrhagic infarct in May 2018.  He is currently on aspirin 325 daily for secondary stroke prevention without further stroke or TIA symptoms.  He has no bruising and no bleeding.  He remains on Lipitor without myalgias.  Primary care follows his labs.  Cardiac event monitor was ordered at his last visit however he did not follow through with giving them financial information to pay for the test.  Blood pressure in the office today 135/74.  He does complain with some dizziness after taking his blood pressure medicine in the morning.  He was advised to take it at bedtime.  He has stopped smoking, stopped cocaine and stopped marijuana.  He occasionally has a beer he denies any further neurologic symptoms.  He walks for exercise REVIEW OF SYSTEMS: Full 14 system review of systems performed and notable only for those listed, all others are neg:  Constitutional: neg  Cardiovascular: neg Ear/Nose/Throat: neg  Skin: neg Eyes: neg Respiratory: neg Gastroitestinal: neg  Hematology/Lymphatic: neg  Endocrine: neg Musculoskeletal: neg Allergy/Immunology: neg Neurological: neg Psychiatric: neg Sleep : neg   ALLERGIES: No Known Allergies  HOME MEDICATIONS: Outpatient  Medications Prior to Visit  Medication Sig Dispense Refill  . aspirin 325 MG tablet Take 1 tablet (325 mg total) by mouth daily.    Marland Kitchen atorvastatin (LIPITOR) 40 MG tablet Take 1 tablet (40 mg total) by mouth daily at 6 PM. 30 tablet 1  . methocarbamol (ROBAXIN) 500 MG tablet Take 1 tablet (500 mg total) by mouth every 8 (eight) hours as needed for muscle spasms. 30 tablet 0  . Metoprolol Succinate 25 MG CS24 Take by mouth.     No facility-administered medications prior to visit.     PAST MEDICAL HISTORY: Past Medical History:  Diagnosis Date  . Stroke [facility]) 10/2016    PAST SURGICAL HISTORY: Past Surgical History:  Procedure Laterality Date  . arthroscopic surgery      FAMILY HISTORY: Family History  Problem Relation Age of Onset  . Cancer Mother   . Heart attack Father     SOCIAL HISTORY: Social History   Socioeconomic History  . Marital status: Single    Spouse name: Not on file  . Number of children: Not on file  . Years of education: Not on file  . Highest education level: Not on file  Occupational History  . Not on file  Social Needs  . Financial resource strain: Not on file  . Food insecurity:    Worry: Not on file    Inability: Not on file  . Transportation needs:    Medical: Not on file    Non-medical: Not on file  Tobacco Use  . Smoking status: Former Smoker    Packs/day: 0.50    Types: Cigarettes    Last attempt to quit: 11/02/2016    Years since quitting: 1.1  . Smokeless tobacco: Never Used  Substance and Sexual Activity  . Alcohol use: Yes  . Drug use: Yes    Types: Marijuana  . Sexual activity: Yes  Lifestyle  . Physical activity:    Days per week: Not on file    Minutes per session: Not on file  . Stress: Not on file  Relationships  . Social connections:    Talks on phone: Not on file    Gets together: Not on file    Attends religious service: Not on file    Active member of club or organization: Not on file    Attends meetings of  clubs or organizations: Not on file    Relationship status: Not on file  . Intimate partner violence:    Fear of current or ex partner: Not on file    Emotionally abused: Not on file    Physically abused: Not on file    Forced sexual activity: Not on file  Other Topics Concern  . Not on file  Social History Narrative  . Not on file     PHYSICAL EXAM  There were no vitals filed for this visit. There is no height or weight on file to calculate BMI.  Generalized: Well developed, Obese male in no acute distress  Head: normocephalic and atraumatic,. Oropharynx benign  Neck: Supple, no carotid bruits  Cardiac: Regular rate rhythm, no murmur  Musculoskeletal: No deformity   Neurological examination   Mentation: Alert oriented to time, place, history taking. Attention span and concentration appropriate. Recent and remote memory intact.  Follows all commands speech and language fluent.  Cranial nerve II-XII: .Pupils were equal round reactive to light extraocular movements were full, visual field were full on confrontational test. Facial sensation decreased on the right and strength were normal. hearing was intact to finger rubbing bilaterally. Uvula tongue midline. head turning and shoulder shrug were normal and symmetric.Tongue protrusion into cheek strength was normal. Motor: normal bulk and tone, full strength in the BUE, BLE, Sensory: normal and symmetric to light touch, pinprick, and  Vibration, in the upper and lower extremities Coordination: Normal finger-nose-finger, no dysmetria no tremor Reflexes: Symmetric upper and lower plantar responses were flexor bilaterally. Gait and Station: Rising up from seated position without assistance, normal stance,  moderate stride, good arm swing, smooth turning, able to perform tiptoe, and heel walking without difficulty. Tandem gait is mildly un steady.  No assistive device  DIAGNOSTIC DATA (LABS, IMAGING, TESTING) - I reviewed patient  records, labs, notes, testing and imaging myself where available.  Lab Results  Component Value Date   WBC 6.0 07/21/2017   HGB 14.6 07/21/2017   HCT 43.0 07/21/2017   MCV 91.2 07/21/2017   PLT 250 07/21/2017      Component Value Date/Time   NA 139 07/21/2017 1526   K 3.9 07/21/2017 1526   CL 105 07/21/2017 1526   CO2 22 07/21/2017 1504   GLUCOSE 107 (H) 07/21/2017 1526   BUN 26 (H) 07/21/2017 1526   CREATININE 1.10 07/21/2017 1526   CALCIUM 9.3 07/21/2017 1504   PROT 6.9 07/21/2017 1504   ALBUMIN 4.1 07/21/2017 1504   AST 54 (H) 07/21/2017 1504   ALT 97 (H) 07/21/2017 1504   ALKPHOS 58 07/21/2017 1504   BILITOT 0.2 (L) 07/21/2017 1504   GFRNONAA >60 07/21/2017 1504   GFRAA >60 07/21/2017 1504   Lab Results  Component Value Date   CHOL 283 (H) 11/04/2016   HDL 39 (L) 11/04/2016   LDLCALC 166 (H) 11/04/2016   TRIG 388 (H) 11/04/2016   CHOLHDL 7.3 11/04/2016   Lab Results  Component Value Date   HGBA1C 5.7 (H) 11/03/2016    ASSESSMENT AND PLAN  52 y.o. 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    PLAN: Stressed the importance of management of risk factors to prevent further stroke Continue aspirin for secondary stroke prevention Maintain strict control of hypertension with blood pressure goal below 130/90, today's reading 135/74 Take blood pressure medicine at night to see if dizziness goes away Will get report 30 day event monitor Control of diabetes with hemoglobin A1c below 6.5 followed by primary care  Cholesterol with LDL cholesterol less than 70, followed by primary care,   continue statin drugs Lipitor Continue daily exercise at least 30 minutes  eat healthy diet with whole grains,  fresh fruits and vegetables Patient has stopped smoking and cocaine, still has occasional alcohol follow up with [place] in 6 months I spent 25 minutes in total face to face time with the patient more than 50% of  which was spent counseling and coordination of care, reviewing test results reviewing medications and discussing and reviewing the diagnosis of stroke and management of risk factors Late entry According to cardiology pt would not give them financial information needed for cardiac monitoring so they cancelled the test.  Dennie Bible, [facility], [facility], APRN  [facility] [address] [place], [place] [address] [phone]

## 2017-12-22 ENCOUNTER — Telehealth: Payer: Self-pay | Admitting: *Deleted

## 2017-12-22 ENCOUNTER — Ambulatory Visit: Payer: Self-pay | Admitting: Nurse Practitioner

## 2017-12-22 NOTE — Telephone Encounter (Signed)
Patient was no show for follow up with NP today.  

## 2017-12-23 ENCOUNTER — Encounter: Payer: Self-pay | Admitting: Nurse Practitioner

## 2018-05-31 IMAGING — CT CT HEAD W/ CM
4 series · 15 of 47 positions shown, 17 images · non-contrast
Comparison: Two days ago.

CLINICAL DATA: Right-sided weakness

EXAM:
CT HEAD WITHOUT CONTRAST
TECHNIQUE: Contiguous axial images were obtained from the base of the skull
through the vertex without intravenous contrast.

[Series 3: head without · axial · non-contrast · 0.48mm/px · z∈[-39,+81]mm · 7 of 34 slices shown, 9 images]
[im 5/34  brain]
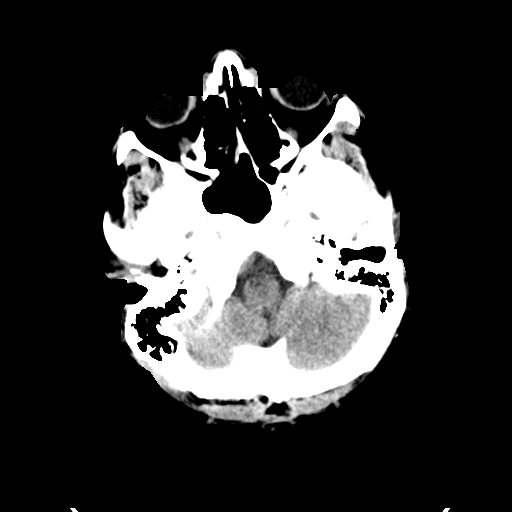
[im 5/34  bone]
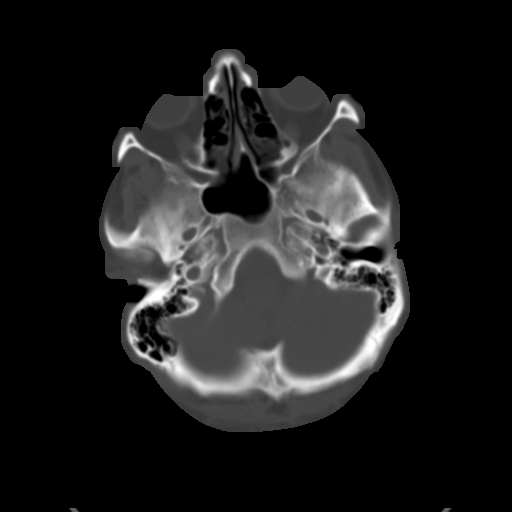
[im 9/34  brain]
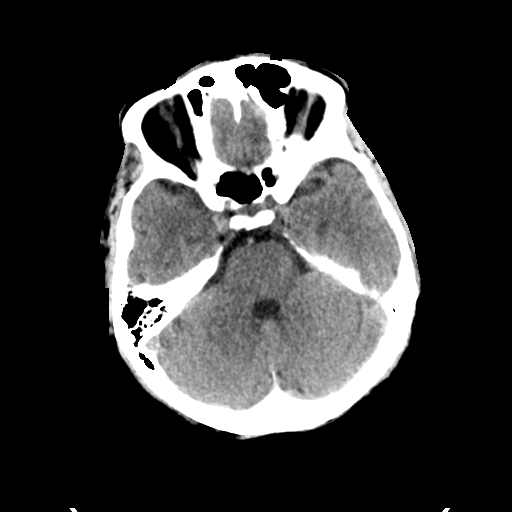
[im 13/34  brain]
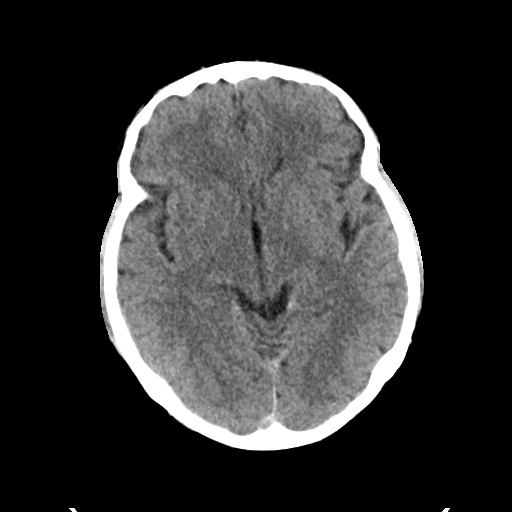
[im 17/34  brain]
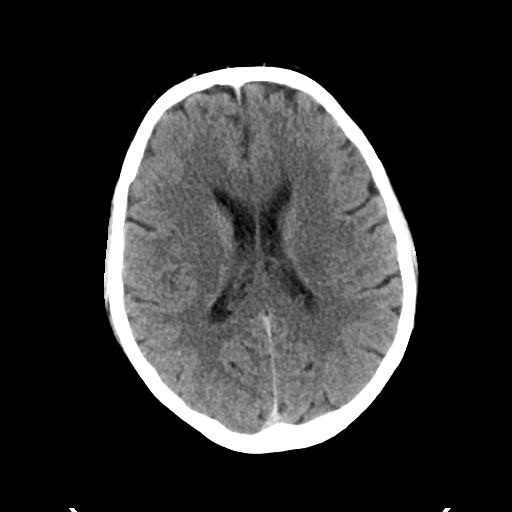
[im 21/34  brain]
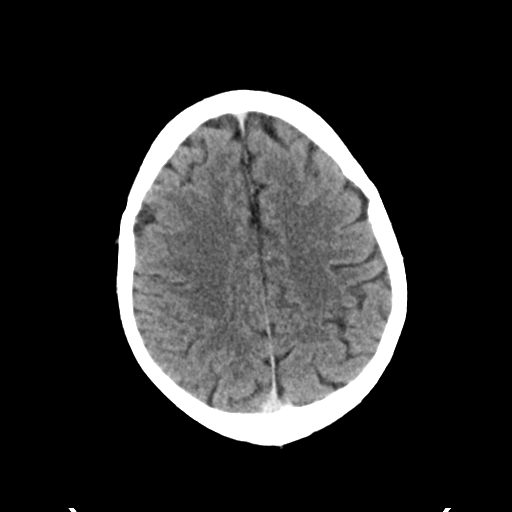
[im 21/34  bone]
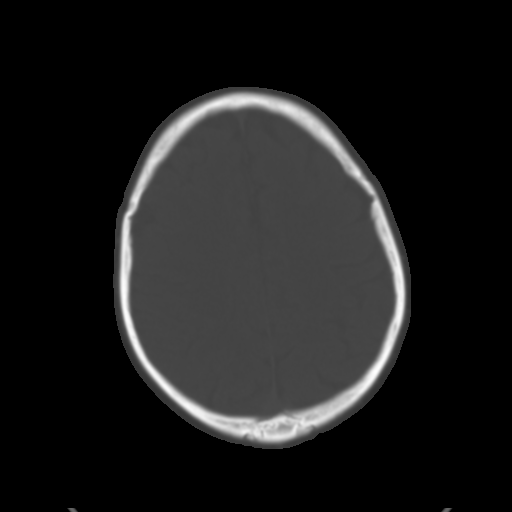
[im 25/34  brain]
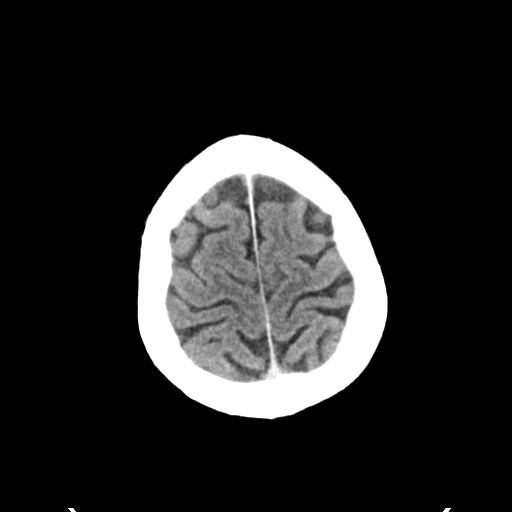
[im 29/34  brain]
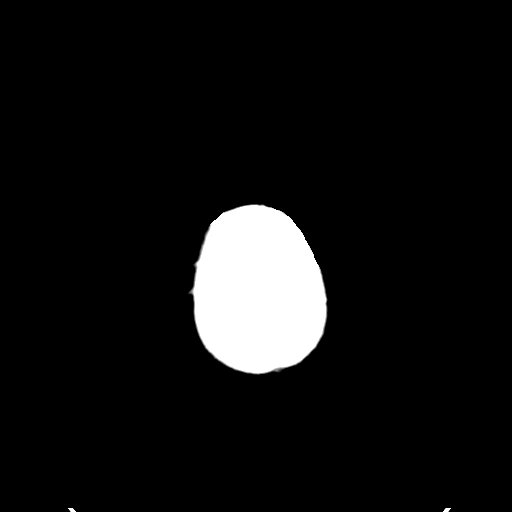

[Series 4: head bone · axial · 0.48mm/px · z∈[-43,-27]mm · 2 of 84 slices shown]
[im 9/84  bone]
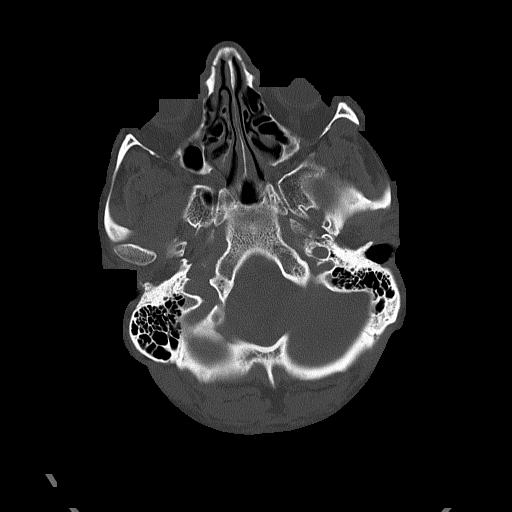
[im 17/84  bone]
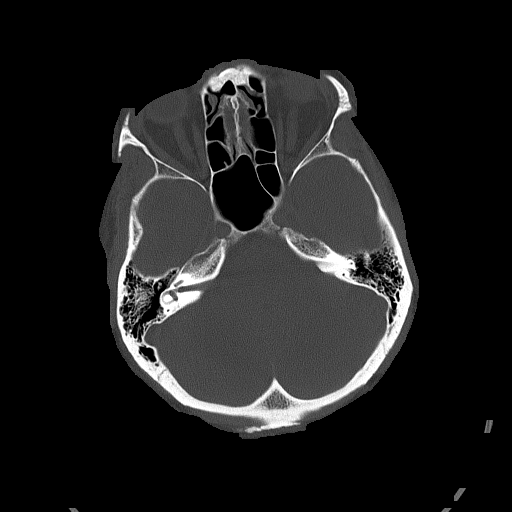

[Series 5: head without cor · coronal · non-contrast · 0.37mm/px · 3 of 67 slices shown]
[im 23/67  brain]
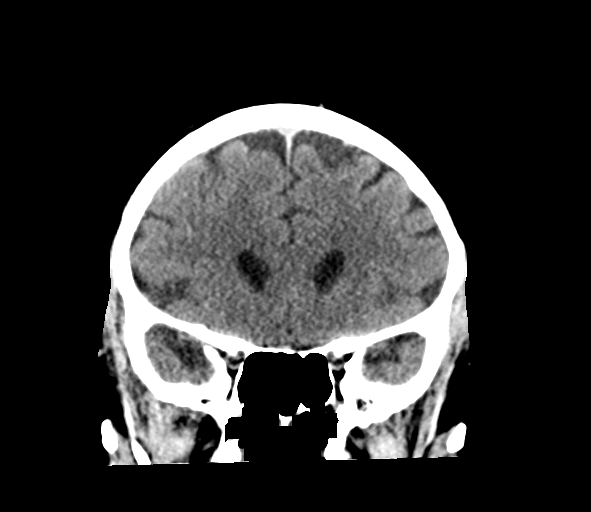
[im 30/67  brain]
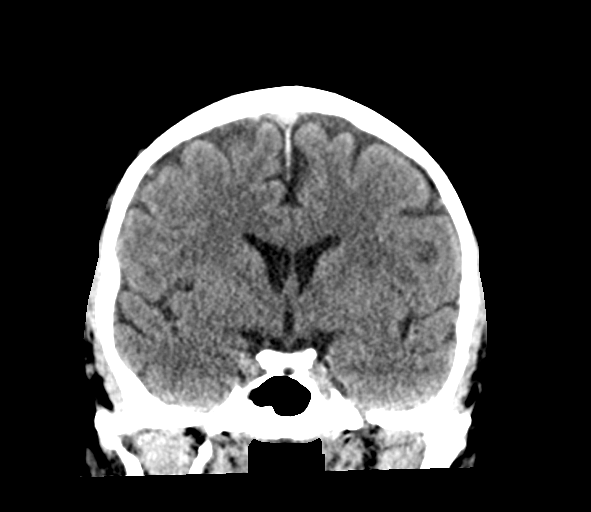
[im 37/67  brain]
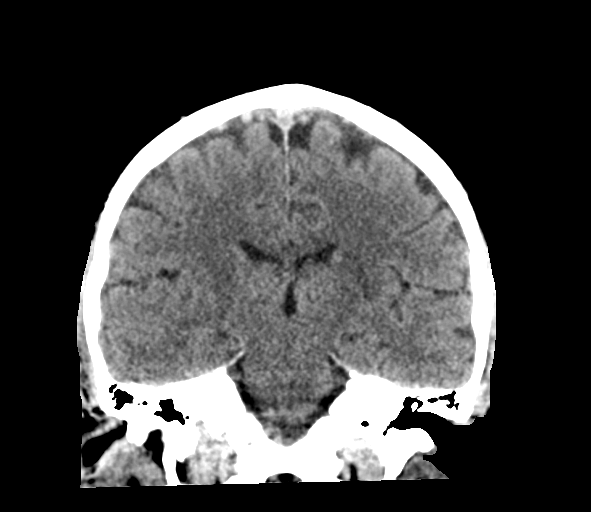

[Series 6: head without sag · sagittal · non-contrast · 0.33mm/px · 3 of 67 slices shown]
[im 23/67  brain]
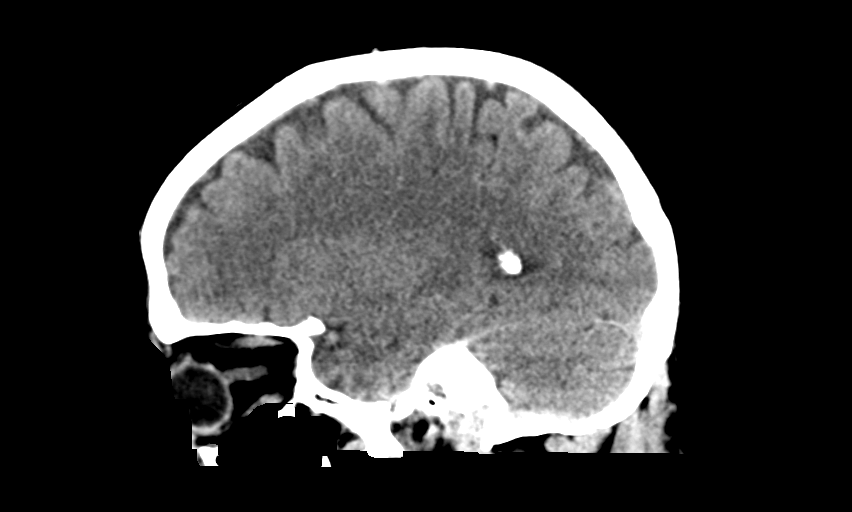
[im 34/67  brain]
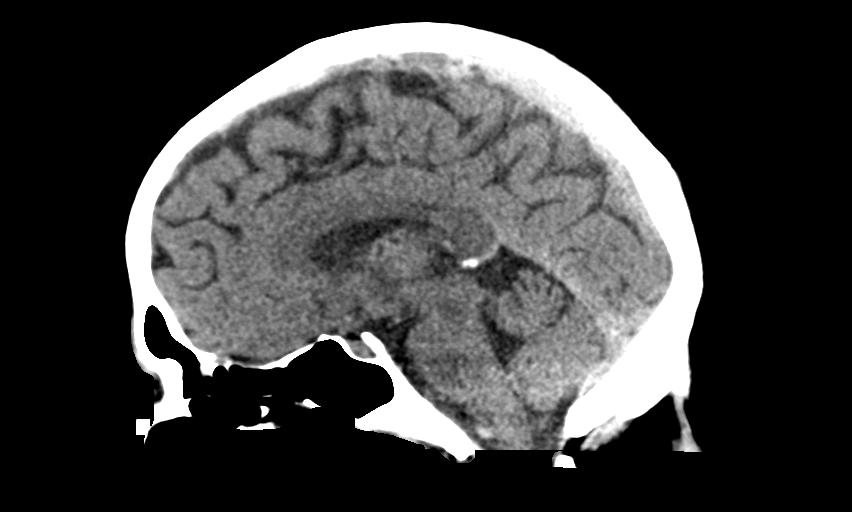
[im 45/67  brain]
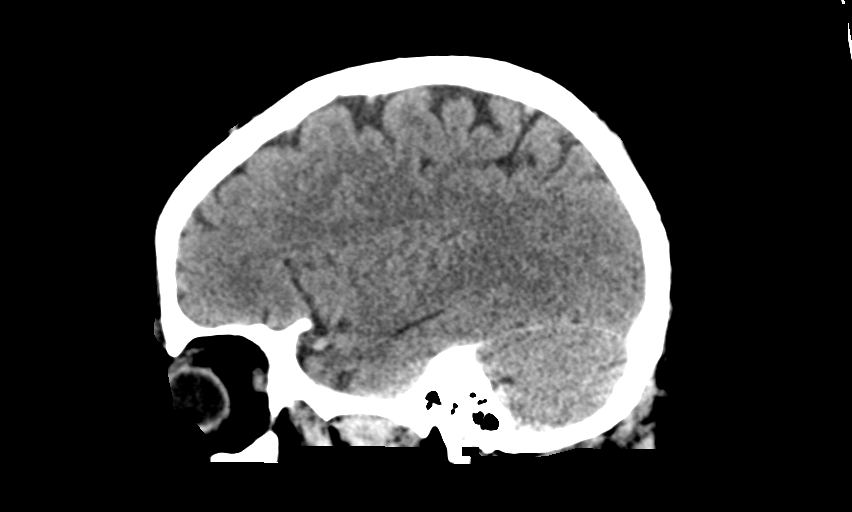

[15 of 47 positions shown; findings below may reference images not displayed]

FINDINGS: Brain: Acute infarct in the left basal ganglia and corona radiata
shows no evidence of progression or hemorrhage. No evidence of new
infarct. No hydrocephalus or mass effect.

Vascular: No hyperdense vessel.

Skull: No acute or aggressive finding.

Sinuses/Orbits: Negative
IMPRESSION: Known acute infarct in the left basal ganglia and corona radiata. No
evidence of progression. No hemorrhagic conversion.

## 2020-06-26 ENCOUNTER — Other Ambulatory Visit: Payer: Self-pay | Admitting: Family Medicine

## 2020-06-26 ENCOUNTER — Ambulatory Visit
Admission: RE | Admit: 2020-06-26 | Discharge: 2020-06-26 | Disposition: A | Discharge status: Home / Self Care | Payer: 59 | Source: Ambulatory Visit | Attending: Family Medicine | Admitting: Family Medicine

## 2020-06-26 DIAGNOSIS — M542 Cervicalgia: Secondary | ICD-10-CM

## 2020-11-07 ENCOUNTER — Ambulatory Visit
Admission: EM | Admit: 2020-11-07 | Discharge: 2020-11-07 | Disposition: A | Discharge status: Home / Self Care | Payer: 59 | Attending: Family Medicine | Admitting: Family Medicine

## 2020-11-07 ENCOUNTER — Encounter: Payer: Self-pay | Admitting: Emergency Medicine

## 2020-11-07 ENCOUNTER — Other Ambulatory Visit: Payer: Self-pay

## 2020-11-07 DIAGNOSIS — Z20822 Contact with and (suspected) exposure to covid-19: Secondary | ICD-10-CM

## 2020-11-07 NOTE — ED Triage Notes (Signed)
Family member tested positive for covid.  Wants to be covid tested.

## 2020-11-08 LAB — NOVEL CORONAVIRUS, NAA: SARS-CoV-2, NAA: DETECTED — AB

## 2020-11-08 LAB — SARS-COV-2, NAA 2 DAY TAT

## 2020-11-09 ENCOUNTER — Telehealth: Payer: Self-pay | Admitting: Oncology

## 2020-11-09 NOTE — Telephone Encounter (Signed)
Called to discuss with patient about COVID-19 symptoms and the use of one of the available treatments for those with mild to moderate Covid symptoms and at a high risk of hospitalization.  Pt appears to qualify for outpatient treatment due to co-morbid conditions and/or a member of an at-risk group in accordance with the FDA Emergency Use Authorization.    Symptom onset:11/07/20 Vaccinated: Solicitor? no Immunocompromised? no Qualifiers:  Past Medical History:  Diagnosis Date  . Stroke [facility]) 10/2016   NIH Criteria: Tier 2  Unable to reach pt - No VM available. Sent Text message  Jacquelin Hawking

## 2021-03-30 ENCOUNTER — Encounter: Payer: Self-pay | Admitting: Internal Medicine

## 2021-04-18 ENCOUNTER — Ambulatory Visit (INDEPENDENT_AMBULATORY_CARE_PROVIDER_SITE_OTHER): Payer: Self-pay | Admitting: *Deleted

## 2021-04-18 ENCOUNTER — Other Ambulatory Visit: Payer: Self-pay

## 2021-04-18 ENCOUNTER — Encounter: Payer: Self-pay | Admitting: *Deleted

## 2021-04-18 VITALS — Ht 65.0 in | Wt 199.8 lb

## 2021-04-18 DIAGNOSIS — Z1211 Encounter for screening for malignant neoplasm of colon: Secondary | ICD-10-CM

## 2021-04-18 MED ORDER — NA SULFATE-K SULFATE-MG SULF 17.5-3.13-1.6 GM/177ML PO SOLN: 1.0000 | Freq: Once | ORAL | 0 refills | Status: AC

## 2021-04-18 NOTE — Progress Notes (Signed)
Gastroenterology Pre-Procedure Review  Request Date: 04/18/2021 Requesting Physician: Dr. Criss Rosales, no previous TCS  PATIENT REVIEW QUESTIONS: The patient responded to the following health history questions as indicated:    1. Diabetes Melitis: no 2. Joint replacements in the past 12 months: no 3. Major health problems in the past 3 months: yes, acid reflux, PCP has been managing, pt doesn't take any medication for it 4. Has an artificial valve or MVP: no 5. Has a defibrillator: no 6. Has been advised in past to take antibiotics in advance of a procedure like teeth cleaning: no 7. Family history of colon cancer: no  8. Alcohol Use: yes, 2 beers a day 9. Illicit drug Use: yes, marijuana daily 10. History of sleep apnea: no  11. History of coronary artery or other vascular stents placed within the last 12 months: no 12. History of any prior anesthesia complications: no 13. Body mass index is 33.25 kg/m.    MEDICATIONS & ALLERGIES:    Patient reports the following regarding taking any blood thinners:   Plavix? no Aspirin? Yes, 81 mg as needed Coumadin? no Brilinta? no Xarelto? no Eliquis? no Pradaxa? no Savaysa? no Effient? no  Patient confirms/reports the following medications:  Current Outpatient Medications  Medication Sig Dispense Refill   aspirin EC 81 MG tablet Take 81 mg by mouth as needed. Swallow whole.     losartan (COZAAR) 50 MG tablet Take 50 mg by mouth daily.     rosuvastatin (CRESTOR) 20 MG tablet Take 20 mg by mouth daily at 6 (six) AM.     No current facility-administered medications for this visit.    Patient confirms/reports the following allergies:  No Known Allergies  No orders of the defined types were placed in this encounter.   AUTHORIZATION INFORMATION Primary Insurance: [facility],  ID #: [id number],  Group #: [id number] Pre-Cert / Auth required: No, not required  SCHEDULE INFORMATION: Procedure has been scheduled as follows:  Date: 05/15/2021,  Time: 2:30  Location: [facility] with Dr. Abbey Chatters  This Gastroenterology Pre-Precedure Review Form is being routed to the following provider(s): Neil Crouch, PA-C

## 2021-04-23 NOTE — Progress Notes (Signed)
Ok to schedule.  ASA II 

## 2021-05-15 ENCOUNTER — Encounter (HOSPITAL_COMMUNITY): Payer: Self-pay

## 2021-05-15 ENCOUNTER — Ambulatory Visit (HOSPITAL_COMMUNITY): Payer: 59 | Admitting: Anesthesiology

## 2021-05-15 ENCOUNTER — Ambulatory Visit (HOSPITAL_COMMUNITY)
Admission: RE | Admit: 2021-05-15 | Discharge: 2021-05-15 | Disposition: A | Discharge status: Home / Self Care | Payer: 59 | Source: Ambulatory Visit | Attending: Internal Medicine | Admitting: Internal Medicine

## 2021-05-15 ENCOUNTER — Encounter (HOSPITAL_COMMUNITY)
Admission: RE | Disposition: A | Discharge status: Home / Self Care | Payer: Self-pay | Source: Ambulatory Visit | Attending: Internal Medicine

## 2021-05-15 ENCOUNTER — Other Ambulatory Visit: Payer: Self-pay

## 2021-05-15 DIAGNOSIS — K648 Other hemorrhoids: Secondary | ICD-10-CM | POA: Insufficient documentation

## 2021-05-15 DIAGNOSIS — K621 Rectal polyp: Secondary | ICD-10-CM

## 2021-05-15 DIAGNOSIS — I10 Essential (primary) hypertension: Secondary | ICD-10-CM | POA: Insufficient documentation

## 2021-05-15 DIAGNOSIS — D126 Benign neoplasm of colon, unspecified: Secondary | ICD-10-CM

## 2021-05-15 DIAGNOSIS — Z87891 Personal history of nicotine dependence: Secondary | ICD-10-CM | POA: Diagnosis not present

## 2021-05-15 DIAGNOSIS — Z1211 Encounter for screening for malignant neoplasm of colon: Secondary | ICD-10-CM | POA: Diagnosis not present

## 2021-05-15 DIAGNOSIS — Z8673 Personal history of transient ischemic attack (TIA), and cerebral infarction without residual deficits: Secondary | ICD-10-CM | POA: Insufficient documentation

## 2021-05-15 DIAGNOSIS — D124 Benign neoplasm of descending colon: Secondary | ICD-10-CM

## 2021-05-15 HISTORY — DX: Benign neoplasm of colon, unspecified: D12.6

## 2021-05-15 HISTORY — PX: COLONOSCOPY WITH PROPOFOL: SHX5780

## 2021-05-15 HISTORY — DX: Rectal polyp: K62.1

## 2021-05-15 HISTORY — DX: Pure hypercholesterolemia, unspecified: E78.00

## 2021-05-15 HISTORY — PX: POLYPECTOMY: SHX5525

## 2021-05-15 HISTORY — DX: Essential (primary) hypertension: I10

## 2021-05-15 SURGERY — COLONOSCOPY WITH PROPOFOL
Anesthesia: General

## 2021-05-15 MED ORDER — LACTATED RINGERS IV SOLN: INTRAVENOUS | Status: DC

## 2021-05-15 MED ORDER — PROPOFOL 10 MG/ML IV BOLUS
INTRAVENOUS | Status: DC | PRN
  Administered 2021-05-15: 100 mg via INTRAVENOUS
  Administered 2021-05-15: 30 mg via INTRAVENOUS
  Administered 2021-05-15: 50 mg via INTRAVENOUS
  Administered 2021-05-15: 40 mg via INTRAVENOUS
  Administered 2021-05-15: 50 mg via INTRAVENOUS

## 2021-05-15 MED ORDER — LIDOCAINE HCL (CARDIAC) PF 100 MG/5ML IV SOSY
PREFILLED_SYRINGE | INTRAVENOUS | Status: DC | PRN
  Administered 2021-05-15: 60 mg via INTRAVENOUS

## 2021-05-15 NOTE — Anesthesia Preprocedure Evaluation (Signed)
Anesthesia Evaluation  Patient identified by MRN, date of birth, ID band Patient awake    Reviewed: Allergy & Precautions, H&P , NPO status , Patient's Chart, lab work & pertinent test results, reviewed documented beta blocker date and time   Airway Mallampati: II  TM Distance: >3 FB Neck ROM: full    Dental no notable dental hx.    Pulmonary neg pulmonary ROS, former smoker,    Pulmonary exam normal breath sounds clear to auscultation       Cardiovascular Exercise Tolerance: Good hypertension, negative cardio ROS   Rhythm:regular Rate:Normal     Neuro/Psych CVA, No Residual Symptoms negative psych ROS   GI/Hepatic negative GI ROS, Neg liver ROS,   Endo/Other  negative endocrine ROS  Renal/GU negative Renal ROS  negative genitourinary   Musculoskeletal   Abdominal   Peds  Hematology negative hematology ROS (+)   Anesthesia Other Findings   Reproductive/Obstetrics negative OB ROS                             Anesthesia Physical Anesthesia Plan  ASA: 3  Anesthesia Plan: General   Post-op Pain Management:    Induction:   PONV Risk Score and Plan: Propofol infusion  Airway Management Planned:   Additional Equipment:   Intra-op Plan:   Post-operative Plan:   Informed Consent: I have reviewed the patients History and Physical, chart, labs and discussed the procedure including the risks, benefits and alternatives for the proposed anesthesia with the patient or authorized representative who has indicated his/her understanding and acceptance.     Dental Advisory Given  Plan Discussed with: CRNA  Anesthesia Plan Comments:         Anesthesia Quick Evaluation

## 2021-05-15 NOTE — Discharge Instructions (Addendum)
  Colonoscopy Discharge Instructions  Read the instructions outlined below and refer to this sheet in the next few weeks. These discharge instructions provide you with general information on caring for yourself after you leave the hospital. Your doctor may also give you specific instructions. While your treatment has been planned according to the most current medical practices available, unavoidable complications occasionally occur.   ACTIVITY You may resume your regular activity, but move at a slower pace for the next 24 hours.  Take frequent rest periods for the next 24 hours.  Walking will help get rid of the air and reduce the bloated feeling in your belly (abdomen).  No driving for 24 hours (because of the medicine (anesthesia) used during the test).   Do not sign any important legal documents or operate any machinery for 24 hours (because of the anesthesia used during the test).  NUTRITION Drink plenty of fluids.  You may resume your normal diet as instructed by your doctor.  Begin with a light meal and progress to your normal diet. Heavy or fried foods are harder to digest and may make you feel sick to your stomach (nauseated).  Avoid alcoholic beverages for 24 hours or as instructed.  MEDICATIONS You may resume your normal medications unless your doctor tells you otherwise.  WHAT YOU CAN EXPECT TODAY Some feelings of bloating in the abdomen.  Passage of more gas than usual.  Spotting of blood in your stool or on the toilet paper.  IF YOU HAD POLYPS REMOVED DURING THE COLONOSCOPY: No aspirin products for 7 days or as instructed.  No alcohol for 7 days or as instructed.  Eat a soft diet for the next 24 hours.  FINDING OUT THE RESULTS OF YOUR TEST Not all test results are available during your visit. If your test results are not back during the visit, make an appointment with your caregiver to find out the results. Do not assume everything is normal if you have not heard from your  caregiver or the medical facility. It is important for you to follow up on all of your test results.  SEEK IMMEDIATE MEDICAL ATTENTION IF: You have more than a spotting of blood in your stool.  Your belly is swollen (abdominal distention).  You are nauseated or vomiting.  You have a temperature over 101.  You have abdominal pain or discomfort that is severe or gets worse throughout the day.   Your colonoscopy revealed 4 polyp(s) which I removed successfully. Await pathology results, my office will contact you. I recommend repeating colonoscopy in 5 years for surveillance purposes. Otherwise follow up with Gi as needed   I hope you have a great rest of your week!  Charles K. Carver, D.O. Gastroenterology and Hepatology [facility]  

## 2021-05-15 NOTE — Op Note (Signed)
[facility] Patient Name: Philip Howard Procedure Date: 05/15/2021 1:48 PM MRN: [id number] Date of Birth: 1965-10-09 Attending MD: Elon Alas. Edgar Frisk CSN: [id number] Age: 55 Admit Type: Outpatient Procedure:                Colonoscopy Indications:              Screening for colorectal malignant neoplasm Providers:                Elon Alas. Abbey Chatters, DO Referring MD:              Medicines:                See the Anesthesia note for documentation of the                            administered medications Complications:            No immediate complications. Estimated Blood Loss:     Estimated blood loss was minimal. Procedure:                Pre-Anesthesia Assessment:                           - The anesthesia plan was to use monitored                            anesthesia care (MAC).                           After obtaining informed consent, the colonoscope                            was passed under direct vision. Throughout the                            procedure, the patient's blood pressure, pulse, and                            oxygen saturations were monitored continuously. The                            [id number] scope was introduced through the                            anus and advanced to the the cecum, identified by                            appendiceal orifice and ileocecal valve. The                            [id number]-[id number] ([id number] scope was introduced through                            the and advanced to the. The colonoscopy was                            performed without difficulty. The patient  tolerated                            the procedure well. The quality of the bowel                            preparation was evaluated using the BBPS [employer]                            Bowel Preparation Scale) with scores of: Right                            Colon = 3, Transverse Colon = 3 and Left Colon = 3                            (entire mucosa seen  well with no residual staining,                            small fragments of stool or opaque liquid). The                            total BBPS score equals 9. Scope In: 2:03:00 PM Scope Out: 2:15:14 PM Scope Withdrawal Time: 0 hours 10 minutes 19 seconds  Total Procedure Duration: 0 hours 12 minutes 14 seconds  Findings:      The perianal and digital rectal examinations were normal.      Non-bleeding internal hemorrhoids were found during endoscopy.      A 5 mm polyp was found in the descending colon. The polyp was sessile.       The polyp was removed with a cold snare. Resection and retrieval were       complete.      A 8 mm polyp was found in the descending colon. The polyp was       pedunculated. The polyp was removed with a cold snare. Resection and       retrieval were complete.      Two sessile polyps were found in the rectum. The polyps were 4 to 6 mm       in size. These polyps were removed with a cold snare. Resection and       retrieval were complete. Impression:               - Non-bleeding internal hemorrhoids.                           - One 5 mm polyp in the descending colon, removed                            with a cold snare. Resected and retrieved.                           - One 8 mm polyp in the descending colon, removed                            with a cold snare. Resected and retrieved.                           -  Two 4 to 6 mm polyps in the rectum, removed with                            a cold snare. Resected and retrieved. Moderate Sedation:      Per Anesthesia Care Recommendation:           - Patient has a contact number available for                            emergencies. The signs and symptoms of potential                            delayed complications were discussed with the                            patient. Return to normal activities tomorrow.                            Written discharge instructions were provided to the                             patient.                           - Resume previous diet.                           - Continue present medications.                           - Await pathology results.                           - Repeat colonoscopy in 5 years for surveillance.                           - Return to GI clinic PRN. Procedure Code(s):        --- Professional ---                           [phone], Colonoscopy, flexible; with removal of                            tumor(s), polyp(s), or other lesion(s) by snare                            technique Diagnosis Code(s):        --- Professional ---                           Z12.11, Encounter for screening for malignant                            neoplasm of colon                           K63.5, Polyp  of colon                           K62.1, Rectal polyp                           K64.8, Other hemorrhoids CPT copyright 2019 [employer]. All rights reserved. The codes documented in this report are preliminary and upon coder review may  be revised to meet current compliance requirements. Elon Alas. Abbey Chatters, DO Antonito Abbey Chatters, DO 05/15/2021 2:19:59 PM This report has been signed electronically. Number of Addenda: 0

## 2021-05-15 NOTE — H&P (Signed)
Primary Care Physician:  Lucianne Lei, MD Primary Gastroenterologist:  Dr. Abbey Chatters  Pre-Procedure History & Physical: HPI:  Philip Howard is a 55 y.o. male is here for a colonoscopy for colon cancer screening purposes.  Patient denies any family history of colorectal cancer.  No melena or hematochezia.  No abdominal pain or unintentional weight loss.  No change in bowel habits.  Overall feels well from a GI standpoint.  Past Medical History:  Diagnosis Date   Hypercholesteremia    Hypertension    Stroke [facility]) 10/2016    Past Surgical History:  Procedure Laterality Date   arthroscopic surgery      Prior to Admission medications   Medication Sig Start Date End Date Taking? Authorizing Provider  losartan (COZAAR) 50 MG tablet Take 50 mg by mouth daily. 04/16/21  Yes [provider]  rosuvastatin (CRESTOR) 20 MG tablet Take 20 mg by mouth daily. 04/16/21  Yes [provider]    Allergies as of 04/23/2021   (No Known Allergies)    Family History  Problem Relation Age of Onset   Cancer Mother    Heart attack Father     Social History   Socioeconomic History   Marital status: Married    Spouse name: Not on file   Number of children: Not on file   Years of education: Not on file   Highest education level: Not on file  Occupational History   Not on file  Tobacco Use   Smoking status: Former    Packs/day: 0.50    Types: Cigarettes    Quit date: 11/02/2016    Years since quitting: 4.5   Smokeless tobacco: Never  Substance and Sexual Activity   Alcohol use: Yes    Alcohol/week: 35.0 standard drinks    Types: 28 Cans of beer, 7 Shots of liquor per week   Drug use: Yes    Types: Marijuana   Sexual activity: Yes  Other Topics Concern   Not on file  Social History Narrative   Not on file   Social Determinants of Health   Financial Resource Strain: Not on file  Food Insecurity: Not on file  Transportation Needs: Not on file  Physical Activity:  Not on file  Stress: Not on file  Social Connections: Not on file  Intimate Partner Violence: Not on file    Review of Systems: See HPI, otherwise negative ROS  Physical Exam: Vital signs in last 24 hours: Temp:  [97.9 F (36.6 C)] 97.9 F (36.6 C) (11/29 1257) Pulse Rate:  [75] 75 (11/29 1257) Resp:  [19] 19 (11/29 1257) BP: (147)/(81) 147/81 (11/29 1257) SpO2:  [94 %] 94 % (11/29 1257) Weight:  [89.8 kg] 89.8 kg (11/29 1257)   General:   Alert,  Well-developed, well-nourished, pleasant and cooperative in NAD Head:  Normocephalic and atraumatic. Eyes:  Sclera clear, no icterus.   Conjunctiva pink. Ears:  Normal auditory acuity. Nose:  No deformity, discharge,  or lesions. Mouth:  No deformity or lesions, dentition normal. Neck:  Supple; no masses or thyromegaly. Lungs:  Clear throughout to auscultation.   No wheezes, crackles, or rhonchi. No acute distress. Heart:  Regular rate and rhythm; no murmurs, clicks, rubs,  or gallops. Abdomen:  Soft, nontender and nondistended. No masses, hepatosplenomegaly or hernias noted. Normal bowel sounds, without guarding, and without rebound.   Msk:  Symmetrical without gross deformities. Normal posture. Extremities:  Without clubbing or edema. Neurologic:  Alert and  oriented x4;  grossly normal  neurologically. Skin:  Intact without significant lesions or rashes. Cervical Nodes:  No significant cervical adenopathy. Psych:  Alert and cooperative. Normal mood and affect.  Impression/Plan: Philip Howard is here for a colonoscopy to be performed for colon cancer screening purposes.  The risks of the procedure including infection, bleed, or perforation as well as benefits, limitations, alternatives and imponderables have been reviewed with the patient. Questions have been answered. All parties agreeable.

## 2021-05-15 NOTE — Transfer of Care (Signed)
Immediate Anesthesia Transfer of Care Note  Patient: Philip Howard  Procedure(s) Performed: COLONOSCOPY WITH PROPOFOL POLYPECTOMY  Patient Location: Endoscopy Unit  Anesthesia Type:General  Level of Consciousness: awake  Airway & Oxygen Therapy: Patient Spontanous Breathing  Post-op Assessment: Report given to RN and Post -op Vital signs reviewed and stable  Post vital signs: Reviewed and stable  Last Vitals:  Vitals Value Taken Time  BP    Temp    Pulse    Resp    SpO2      Last Pain:  Vitals:   05/15/21 1348  TempSrc:   PainSc: 0-No pain      Patients Stated Pain Goal: 6 (97/91/50 4136)  Complications: No notable events documented.

## 2021-05-15 NOTE — Anesthesia Postprocedure Evaluation (Signed)
Anesthesia Post Note  Patient: Philip Howard  Procedure(s) Performed: COLONOSCOPY WITH PROPOFOL POLYPECTOMY  Patient location during evaluation: Phase II Anesthesia Type: General Level of consciousness: awake Pain management: pain level controlled Vital Signs Assessment: post-procedure vital signs reviewed and stable Respiratory status: spontaneous breathing and respiratory function stable Cardiovascular status: blood pressure returned to baseline and stable Postop Assessment: no headache and no apparent nausea or vomiting Anesthetic complications: no Comments: Late entry   No notable events documented.   Last Vitals:  Vitals:   05/15/21 1257 05/15/21 1418  BP: (!) 147/81 (!) 96/58  Pulse: 75   Resp: 19 14  Temp: 36.6 C 36.6 C  SpO2: 94% 96%    Last Pain:  Vitals:   05/15/21 1418  TempSrc: Oral  PainSc: 0-No pain                 Louann Sjogren

## 2021-05-17 LAB — SURGICAL PATHOLOGY

## 2021-05-21 ENCOUNTER — Encounter (HOSPITAL_COMMUNITY): Payer: Self-pay | Admitting: Internal Medicine

## 2022-08-08 DIAGNOSIS — K219 Gastro-esophageal reflux disease without esophagitis: Secondary | ICD-10-CM | POA: Diagnosis not present

## 2022-08-08 DIAGNOSIS — G629 Polyneuropathy, unspecified: Secondary | ICD-10-CM | POA: Diagnosis not present

## 2022-08-08 DIAGNOSIS — Z125 Encounter for screening for malignant neoplasm of prostate: Secondary | ICD-10-CM | POA: Diagnosis not present

## 2022-08-08 DIAGNOSIS — C6290 Malignant neoplasm of unspecified testis, unspecified whether descended or undescended: Secondary | ICD-10-CM | POA: Diagnosis not present

## 2022-08-08 DIAGNOSIS — E782 Mixed hyperlipidemia: Secondary | ICD-10-CM | POA: Diagnosis not present

## 2022-08-08 DIAGNOSIS — Z8673 Personal history of transient ischemic attack (TIA), and cerebral infarction without residual deficits: Secondary | ICD-10-CM | POA: Diagnosis not present

## 2022-08-08 DIAGNOSIS — I10 Essential (primary) hypertension: Secondary | ICD-10-CM | POA: Diagnosis not present

## 2022-09-23 DIAGNOSIS — K219 Gastro-esophageal reflux disease without esophagitis: Secondary | ICD-10-CM | POA: Diagnosis not present

## 2022-09-23 DIAGNOSIS — I10 Essential (primary) hypertension: Secondary | ICD-10-CM | POA: Diagnosis not present

## 2022-09-23 DIAGNOSIS — R739 Hyperglycemia, unspecified: Secondary | ICD-10-CM | POA: Diagnosis not present

## 2022-09-23 DIAGNOSIS — Z6832 Body mass index (BMI) 32.0-32.9, adult: Secondary | ICD-10-CM | POA: Diagnosis not present

## 2022-09-23 DIAGNOSIS — R519 Headache, unspecified: Secondary | ICD-10-CM | POA: Diagnosis not present

## 2022-09-23 DIAGNOSIS — E782 Mixed hyperlipidemia: Secondary | ICD-10-CM | POA: Diagnosis not present

## 2022-10-07 DIAGNOSIS — I10 Essential (primary) hypertension: Secondary | ICD-10-CM | POA: Diagnosis not present

## 2022-10-07 DIAGNOSIS — R739 Hyperglycemia, unspecified: Secondary | ICD-10-CM | POA: Diagnosis not present

## 2022-10-07 DIAGNOSIS — Z8673 Personal history of transient ischemic attack (TIA), and cerebral infarction without residual deficits: Secondary | ICD-10-CM | POA: Diagnosis not present

## 2022-10-07 DIAGNOSIS — E782 Mixed hyperlipidemia: Secondary | ICD-10-CM | POA: Diagnosis not present

## 2022-10-31 DIAGNOSIS — Z87891 Personal history of nicotine dependence: Secondary | ICD-10-CM | POA: Diagnosis not present

## 2022-10-31 DIAGNOSIS — Z6833 Body mass index (BMI) 33.0-33.9, adult: Secondary | ICD-10-CM | POA: Diagnosis not present

## 2022-10-31 DIAGNOSIS — I129 Hypertensive chronic kidney disease with stage 1 through stage 4 chronic kidney disease, or unspecified chronic kidney disease: Secondary | ICD-10-CM | POA: Diagnosis not present

## 2022-10-31 DIAGNOSIS — Z8673 Personal history of transient ischemic attack (TIA), and cerebral infarction without residual deficits: Secondary | ICD-10-CM | POA: Diagnosis not present

## 2022-10-31 DIAGNOSIS — E669 Obesity, unspecified: Secondary | ICD-10-CM | POA: Diagnosis not present

## 2022-10-31 DIAGNOSIS — E785 Hyperlipidemia, unspecified: Secondary | ICD-10-CM | POA: Diagnosis not present

## 2022-10-31 DIAGNOSIS — R739 Hyperglycemia, unspecified: Secondary | ICD-10-CM | POA: Diagnosis not present

## 2022-10-31 DIAGNOSIS — Z833 Family history of diabetes mellitus: Secondary | ICD-10-CM | POA: Diagnosis not present

## 2022-10-31 DIAGNOSIS — N1831 Chronic kidney disease, stage 3a: Secondary | ICD-10-CM | POA: Diagnosis not present

## 2022-10-31 DIAGNOSIS — Z809 Family history of malignant neoplasm, unspecified: Secondary | ICD-10-CM | POA: Diagnosis not present

## 2022-10-31 DIAGNOSIS — Z8249 Family history of ischemic heart disease and other diseases of the circulatory system: Secondary | ICD-10-CM | POA: Diagnosis not present

## 2022-11-07 ENCOUNTER — Other Ambulatory Visit: Payer: Self-pay | Admitting: Family Medicine

## 2022-11-07 ENCOUNTER — Ambulatory Visit
Admission: RE | Admit: 2022-11-07 | Discharge: 2022-11-07 | Disposition: A | Discharge status: Home / Self Care | Payer: Self-pay | Source: Ambulatory Visit | Attending: Family Medicine | Admitting: Family Medicine

## 2022-11-07 DIAGNOSIS — M542 Cervicalgia: Secondary | ICD-10-CM

## 2022-11-07 DIAGNOSIS — E782 Mixed hyperlipidemia: Secondary | ICD-10-CM | POA: Diagnosis not present

## 2022-11-07 DIAGNOSIS — M25512 Pain in left shoulder: Secondary | ICD-10-CM

## 2022-11-07 DIAGNOSIS — I10 Essential (primary) hypertension: Secondary | ICD-10-CM | POA: Diagnosis not present

## 2022-11-07 DIAGNOSIS — M7552 Bursitis of left shoulder: Secondary | ICD-10-CM | POA: Diagnosis not present

## 2022-11-07 DIAGNOSIS — S09399A Other specified injury of unspecified middle and inner ear, initial encounter: Secondary | ICD-10-CM | POA: Diagnosis not present

## 2022-11-07 DIAGNOSIS — Z8673 Personal history of transient ischemic attack (TIA), and cerebral infarction without residual deficits: Secondary | ICD-10-CM | POA: Diagnosis not present

## 2022-11-18 DIAGNOSIS — M4692 Unspecified inflammatory spondylopathy, cervical region: Secondary | ICD-10-CM | POA: Diagnosis not present

## 2022-11-18 DIAGNOSIS — I10 Essential (primary) hypertension: Secondary | ICD-10-CM | POA: Diagnosis not present

## 2022-11-18 DIAGNOSIS — E782 Mixed hyperlipidemia: Secondary | ICD-10-CM | POA: Diagnosis not present

## 2022-11-21 DIAGNOSIS — I10 Essential (primary) hypertension: Secondary | ICD-10-CM | POA: Diagnosis not present

## 2022-11-21 DIAGNOSIS — M25612 Stiffness of left shoulder, not elsewhere classified: Secondary | ICD-10-CM | POA: Diagnosis not present

## 2022-11-21 DIAGNOSIS — M25512 Pain in left shoulder: Secondary | ICD-10-CM | POA: Diagnosis not present

## 2022-11-21 DIAGNOSIS — M542 Cervicalgia: Secondary | ICD-10-CM | POA: Diagnosis not present

## 2022-11-25 DIAGNOSIS — M25612 Stiffness of left shoulder, not elsewhere classified: Secondary | ICD-10-CM | POA: Diagnosis not present

## 2022-11-25 DIAGNOSIS — I10 Essential (primary) hypertension: Secondary | ICD-10-CM | POA: Diagnosis not present

## 2022-11-25 DIAGNOSIS — M542 Cervicalgia: Secondary | ICD-10-CM | POA: Diagnosis not present

## 2022-11-25 DIAGNOSIS — M25512 Pain in left shoulder: Secondary | ICD-10-CM | POA: Diagnosis not present

## 2022-11-29 DIAGNOSIS — M25512 Pain in left shoulder: Secondary | ICD-10-CM | POA: Diagnosis not present

## 2022-11-29 DIAGNOSIS — M542 Cervicalgia: Secondary | ICD-10-CM | POA: Diagnosis not present

## 2022-11-29 DIAGNOSIS — I10 Essential (primary) hypertension: Secondary | ICD-10-CM | POA: Diagnosis not present

## 2022-11-29 DIAGNOSIS — M25612 Stiffness of left shoulder, not elsewhere classified: Secondary | ICD-10-CM | POA: Diagnosis not present

## 2022-12-05 DIAGNOSIS — I10 Essential (primary) hypertension: Secondary | ICD-10-CM | POA: Diagnosis not present

## 2022-12-05 DIAGNOSIS — M542 Cervicalgia: Secondary | ICD-10-CM | POA: Diagnosis not present

## 2022-12-05 DIAGNOSIS — M25512 Pain in left shoulder: Secondary | ICD-10-CM | POA: Diagnosis not present

## 2022-12-05 DIAGNOSIS — M25612 Stiffness of left shoulder, not elsewhere classified: Secondary | ICD-10-CM | POA: Diagnosis not present

## 2022-12-10 DIAGNOSIS — I10 Essential (primary) hypertension: Secondary | ICD-10-CM | POA: Diagnosis not present

## 2022-12-10 DIAGNOSIS — M25512 Pain in left shoulder: Secondary | ICD-10-CM | POA: Diagnosis not present

## 2022-12-10 DIAGNOSIS — M542 Cervicalgia: Secondary | ICD-10-CM | POA: Diagnosis not present

## 2022-12-10 DIAGNOSIS — M25612 Stiffness of left shoulder, not elsewhere classified: Secondary | ICD-10-CM | POA: Diagnosis not present

## 2022-12-13 DIAGNOSIS — I10 Essential (primary) hypertension: Secondary | ICD-10-CM | POA: Diagnosis not present

## 2022-12-13 DIAGNOSIS — M542 Cervicalgia: Secondary | ICD-10-CM | POA: Diagnosis not present

## 2022-12-13 DIAGNOSIS — M25512 Pain in left shoulder: Secondary | ICD-10-CM | POA: Diagnosis not present

## 2022-12-13 DIAGNOSIS — M25612 Stiffness of left shoulder, not elsewhere classified: Secondary | ICD-10-CM | POA: Diagnosis not present

## 2022-12-17 DIAGNOSIS — M25612 Stiffness of left shoulder, not elsewhere classified: Secondary | ICD-10-CM | POA: Diagnosis not present

## 2022-12-17 DIAGNOSIS — I10 Essential (primary) hypertension: Secondary | ICD-10-CM | POA: Diagnosis not present

## 2022-12-17 DIAGNOSIS — M542 Cervicalgia: Secondary | ICD-10-CM | POA: Diagnosis not present

## 2022-12-17 DIAGNOSIS — M25512 Pain in left shoulder: Secondary | ICD-10-CM | POA: Diagnosis not present

## 2022-12-24 DIAGNOSIS — M25612 Stiffness of left shoulder, not elsewhere classified: Secondary | ICD-10-CM | POA: Diagnosis not present

## 2022-12-24 DIAGNOSIS — M25512 Pain in left shoulder: Secondary | ICD-10-CM | POA: Diagnosis not present

## 2022-12-24 DIAGNOSIS — I10 Essential (primary) hypertension: Secondary | ICD-10-CM | POA: Diagnosis not present

## 2022-12-24 DIAGNOSIS — M542 Cervicalgia: Secondary | ICD-10-CM | POA: Diagnosis not present

## 2022-12-30 DIAGNOSIS — E782 Mixed hyperlipidemia: Secondary | ICD-10-CM | POA: Diagnosis not present

## 2022-12-30 DIAGNOSIS — M7552 Bursitis of left shoulder: Secondary | ICD-10-CM | POA: Diagnosis not present

## 2022-12-30 DIAGNOSIS — F419 Anxiety disorder, unspecified: Secondary | ICD-10-CM | POA: Diagnosis not present

## 2022-12-30 DIAGNOSIS — G629 Polyneuropathy, unspecified: Secondary | ICD-10-CM | POA: Diagnosis not present

## 2022-12-30 DIAGNOSIS — I10 Essential (primary) hypertension: Secondary | ICD-10-CM | POA: Diagnosis not present

## 2022-12-30 DIAGNOSIS — R739 Hyperglycemia, unspecified: Secondary | ICD-10-CM | POA: Diagnosis not present

## 2022-12-30 DIAGNOSIS — T753XXA Motion sickness, initial encounter: Secondary | ICD-10-CM | POA: Diagnosis not present

## 2022-12-31 DIAGNOSIS — I10 Essential (primary) hypertension: Secondary | ICD-10-CM | POA: Diagnosis not present

## 2022-12-31 DIAGNOSIS — M542 Cervicalgia: Secondary | ICD-10-CM | POA: Diagnosis not present

## 2022-12-31 DIAGNOSIS — M25512 Pain in left shoulder: Secondary | ICD-10-CM | POA: Diagnosis not present

## 2022-12-31 DIAGNOSIS — M25612 Stiffness of left shoulder, not elsewhere classified: Secondary | ICD-10-CM | POA: Diagnosis not present

## 2023-01-02 DIAGNOSIS — M542 Cervicalgia: Secondary | ICD-10-CM | POA: Diagnosis not present

## 2023-01-02 DIAGNOSIS — M25612 Stiffness of left shoulder, not elsewhere classified: Secondary | ICD-10-CM | POA: Diagnosis not present

## 2023-01-02 DIAGNOSIS — M25512 Pain in left shoulder: Secondary | ICD-10-CM | POA: Diagnosis not present

## 2023-01-02 DIAGNOSIS — I10 Essential (primary) hypertension: Secondary | ICD-10-CM | POA: Diagnosis not present

## 2023-01-07 DIAGNOSIS — M25512 Pain in left shoulder: Secondary | ICD-10-CM | POA: Diagnosis not present

## 2023-01-07 DIAGNOSIS — I10 Essential (primary) hypertension: Secondary | ICD-10-CM | POA: Diagnosis not present

## 2023-01-07 DIAGNOSIS — M542 Cervicalgia: Secondary | ICD-10-CM | POA: Diagnosis not present

## 2023-01-07 DIAGNOSIS — M25612 Stiffness of left shoulder, not elsewhere classified: Secondary | ICD-10-CM | POA: Diagnosis not present

## 2023-01-09 DIAGNOSIS — M25612 Stiffness of left shoulder, not elsewhere classified: Secondary | ICD-10-CM | POA: Diagnosis not present

## 2023-01-09 DIAGNOSIS — M25512 Pain in left shoulder: Secondary | ICD-10-CM | POA: Diagnosis not present

## 2023-01-09 DIAGNOSIS — M542 Cervicalgia: Secondary | ICD-10-CM | POA: Diagnosis not present

## 2023-01-09 DIAGNOSIS — I10 Essential (primary) hypertension: Secondary | ICD-10-CM | POA: Diagnosis not present

## 2023-02-10 DIAGNOSIS — E782 Mixed hyperlipidemia: Secondary | ICD-10-CM | POA: Diagnosis not present

## 2023-02-10 DIAGNOSIS — Z8673 Personal history of transient ischemic attack (TIA), and cerebral infarction without residual deficits: Secondary | ICD-10-CM | POA: Diagnosis not present

## 2023-02-10 DIAGNOSIS — F419 Anxiety disorder, unspecified: Secondary | ICD-10-CM | POA: Diagnosis not present

## 2023-02-10 DIAGNOSIS — I10 Essential (primary) hypertension: Secondary | ICD-10-CM | POA: Diagnosis not present

## 2023-02-10 DIAGNOSIS — R739 Hyperglycemia, unspecified: Secondary | ICD-10-CM | POA: Diagnosis not present

## 2023-02-10 DIAGNOSIS — M7552 Bursitis of left shoulder: Secondary | ICD-10-CM | POA: Diagnosis not present

## 2023-02-12 ENCOUNTER — Other Ambulatory Visit: Payer: Self-pay | Admitting: Orthopedic Surgery

## 2023-02-12 DIAGNOSIS — M75100 Unspecified rotator cuff tear or rupture of unspecified shoulder, not specified as traumatic: Secondary | ICD-10-CM

## 2023-02-12 DIAGNOSIS — M25512 Pain in left shoulder: Secondary | ICD-10-CM | POA: Diagnosis not present

## 2023-02-27 ENCOUNTER — Encounter: Payer: Self-pay | Admitting: Orthopedic Surgery

## 2023-02-28 ENCOUNTER — Encounter: Payer: Self-pay | Admitting: Orthopedic Surgery

## 2023-03-05 ENCOUNTER — Encounter: Payer: Self-pay | Admitting: Orthopedic Surgery

## 2023-03-06 ENCOUNTER — Encounter: Payer: Self-pay | Admitting: Orthopedic Surgery

## 2023-03-07 ENCOUNTER — Other Ambulatory Visit: Payer: Self-pay

## 2023-03-26 DIAGNOSIS — M25512 Pain in left shoulder: Secondary | ICD-10-CM | POA: Diagnosis not present

## 2023-04-03 DIAGNOSIS — G629 Polyneuropathy, unspecified: Secondary | ICD-10-CM | POA: Diagnosis not present

## 2023-04-03 DIAGNOSIS — E785 Hyperlipidemia, unspecified: Secondary | ICD-10-CM | POA: Diagnosis not present

## 2023-04-03 DIAGNOSIS — E782 Mixed hyperlipidemia: Secondary | ICD-10-CM | POA: Diagnosis not present

## 2023-04-03 DIAGNOSIS — R739 Hyperglycemia, unspecified: Secondary | ICD-10-CM | POA: Diagnosis not present

## 2023-04-03 DIAGNOSIS — I10 Essential (primary) hypertension: Secondary | ICD-10-CM | POA: Diagnosis not present

## 2023-07-03 DIAGNOSIS — E785 Hyperlipidemia, unspecified: Secondary | ICD-10-CM | POA: Diagnosis not present

## 2023-07-03 DIAGNOSIS — I10 Essential (primary) hypertension: Secondary | ICD-10-CM | POA: Diagnosis not present

## 2023-07-03 DIAGNOSIS — R739 Hyperglycemia, unspecified: Secondary | ICD-10-CM | POA: Diagnosis not present

## 2023-07-03 DIAGNOSIS — E782 Mixed hyperlipidemia: Secondary | ICD-10-CM | POA: Diagnosis not present

## 2023-08-18 ENCOUNTER — Ambulatory Visit
Admission: EM | Admit: 2023-08-18 | Discharge: 2023-08-18 | Disposition: A | Discharge status: Home / Self Care | Attending: Nurse Practitioner | Admitting: Nurse Practitioner

## 2023-08-18 DIAGNOSIS — M7021 Olecranon bursitis, right elbow: Secondary | ICD-10-CM | POA: Diagnosis not present

## 2023-08-18 NOTE — ED Provider Notes (Signed)
 [facility]    CSN: [id number] Arrival date & time: 08/18/23  1110      History   Chief Complaint No chief complaint on file.   HPI Philip Howard is a 58 y.o. male.   Patient presents today with a few day history of right elbow swelling.  He denies any recent fall, trauma, or known injury to the area.  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  No pain with movement of the arm.  No numbness or tingling in the hand.  Has not tried anything for symptoms so far.    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   Weakness of extremity    Cocaine abuse (HCC)    Chronic diastolic congestive heart failure (HCC)    Smoker    Alcohol abuse    Hyperlipidemia    Acute ischemic stroke (HCC) 11/03/2016   Numbness on right side 11/02/2016   Hypokalemia 11/02/2016   Substance abuse (HCC) 11/02/2016   Vertebral artery occlusion, 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 Authorizing Provider  losartan (COZAAR) 50 MG tablet Take 50 mg by mouth daily. 04/16/21   [provider]  rosuvastatin (CRESTOR) 20 MG tablet Take 20 mg by mouth daily.  04/16/21   [provider]    Family History Family History  Problem Relation Age of Onset   Cancer Mother    Heart attack Father     Social History Social History   Tobacco Use   Smoking status: Former    Current packs/day: 0.00    Types: Cigarettes    Quit date: 11/02/2016    Years since quitting: 6.7   Smokeless tobacco: Never  Substance Use Topics   Alcohol use: Yes    Alcohol/week: 35.0 standard drinks of alcohol    Types: 28 Cans of beer, 7 Shots of liquor per week   Drug use: Yes    Types: Marijuana     Allergies   Patient has no known allergies.   Review of Systems Review of Systems Per HPI  Physical Exam Triage Vital Signs ED Triage Vitals [08/18/23 1135]  Encounter Vitals Group     BP 124/85     Systolic BP Percentile      Diastolic BP Percentile      Pulse Rate 76     Resp 18     Temp 98.7 F (37.1 C)     Temp Source Oral     SpO2 96 %     Weight      Height      Head Circumference  Peak Flow      Pain Score 0     Pain Loc      Pain Education      Exclude from Growth Chart    No data found.  Updated Vital Signs BP 124/85 (BP Location: Right Arm)   Pulse 76   Temp 98.7 F (37.1 C) (Oral)   Resp 18   SpO2 96%   Visual Acuity Right Eye Distance:   Left Eye Distance:   Bilateral Distance:    Right Eye Near:   Left Eye Near:    Bilateral Near:     Physical Exam Vitals and nursing note reviewed.  Constitutional:      General: He is not in acute distress.    Appearance: Normal appearance. He is not toxic-appearing.  Pulmonary:     Effort: Pulmonary effort is normal. No respiratory distress.  Musculoskeletal:     Comments: Inspection: Swelling noted to the right olecranon bursa no bruising, obvious deformity, redness Palpation: right elbow and olecranon bursa are nontender to palpation; no obvious deformities palpated ROM: Full ROM to right elbow, wrist, hand Strength: 5/5 right upper extremity Neurovascular:  neurovascularly intact in distal right upper extremity   Skin:    General: Skin is warm and dry.     Capillary Refill: Capillary refill takes less than 2 seconds.     Coloration: Skin is not jaundiced or pale.     Findings: No erythema.  Neurological:     Mental Status: He is alert and oriented to person, place, and time.  Psychiatric:        Behavior: Behavior is cooperative.      UC Treatments / Results  Labs (all labs ordered are listed, but only abnormal results are displayed) Labs Reviewed - No data to display  EKG   Radiology No results found.  Procedures Procedures (including critical care time)  Medications Ordered in UC Medications - No data to display  Initial Impression / Assessment and Plan / UC Course  I have reviewed the triage vital signs and the nursing notes.  Pertinent labs & imaging results that were available during my care of the patient were reviewed by me and considered in my medical decision making (see chart for details).   Patient is well-appearing, normotensive, afebrile, not tachycardic, not tachypneic, oxygenating well on room air.    1. Olecranon bursitis of right elbow Vitals and exam are reassuring today Patient is asymptomatic and is in no pain; urgent drainage deferred Recommended Ace wrap, avoidance of triggers and follow-up with Ortho if symptoms persist/do not improve with treatment for ultrasound and possible drainage Contact information provided for orthopedic providers locally  The patient was given the opportunity to ask questions.  All questions answered to their satisfaction.  The patient is in agreement to this plan.    Final Clinical Impressions(s) / UC Diagnoses   Final diagnoses:  Olecranon bursitis of right elbow     Discharge Instructions      Recommend wearing the Ace wrap to help provide compression to the elbow and avoiding leaning on the right elbow.  This should help with the swelling to improve over the next  few days.  If symptoms do not improve or if you develop pain in the elbow, would recommend follow-up with an orthopedic provider.  Contact information has been provided.   ED Prescriptions   None    PDMP not reviewed this encounter.   Valentino Nose, NP 08/18/23 1254

## 2023-08-18 NOTE — ED Triage Notes (Signed)
 Pt reports swelling he right elbow, denies injury.

## 2023-08-18 NOTE — Discharge Instructions (Signed)
 Recommend wearing the Ace wrap to help provide compression to the elbow and avoiding leaning on the right elbow.  This should help with the swelling to improve over the next few days.  If symptoms do not improve or if you develop pain in the elbow, would recommend follow-up with an orthopedic provider.  Contact information has been provided.

## 2023-10-02 ENCOUNTER — Other Ambulatory Visit: Payer: Self-pay | Admitting: Family Medicine

## 2023-10-02 ENCOUNTER — Ambulatory Visit
Admission: EM | Admit: 2023-10-02 | Discharge: 2023-10-02 | Disposition: A | Discharge status: Home / Self Care | Attending: Nurse Practitioner | Admitting: Nurse Practitioner

## 2023-10-02 ENCOUNTER — Encounter: Payer: Self-pay | Admitting: Emergency Medicine

## 2023-10-02 ENCOUNTER — Ambulatory Visit
Admission: RE | Admit: 2023-10-02 | Discharge: 2023-10-02 | Disposition: A | Discharge status: Home / Self Care | Source: Ambulatory Visit | Attending: Family Medicine | Admitting: Family Medicine

## 2023-10-02 DIAGNOSIS — M25551 Pain in right hip: Secondary | ICD-10-CM | POA: Diagnosis not present

## 2023-10-02 DIAGNOSIS — M1611 Unilateral primary osteoarthritis, right hip: Secondary | ICD-10-CM | POA: Diagnosis not present

## 2023-10-02 DIAGNOSIS — K5900 Constipation, unspecified: Secondary | ICD-10-CM | POA: Diagnosis not present

## 2023-10-02 DIAGNOSIS — R1013 Epigastric pain: Secondary | ICD-10-CM | POA: Diagnosis not present

## 2023-10-02 DIAGNOSIS — E785 Hyperlipidemia, unspecified: Secondary | ICD-10-CM | POA: Diagnosis not present

## 2023-10-02 DIAGNOSIS — I10 Essential (primary) hypertension: Secondary | ICD-10-CM | POA: Diagnosis not present

## 2023-10-02 DIAGNOSIS — R198 Other specified symptoms and signs involving the digestive system and abdomen: Secondary | ICD-10-CM | POA: Diagnosis not present

## 2023-10-02 DIAGNOSIS — R300 Dysuria: Secondary | ICD-10-CM | POA: Diagnosis not present

## 2023-10-02 DIAGNOSIS — Z113 Encounter for screening for infections with a predominantly sexual mode of transmission: Secondary | ICD-10-CM | POA: Diagnosis not present

## 2023-10-02 DIAGNOSIS — R1030 Lower abdominal pain, unspecified: Secondary | ICD-10-CM | POA: Diagnosis not present

## 2023-10-02 DIAGNOSIS — R1084 Generalized abdominal pain: Secondary | ICD-10-CM

## 2023-10-02 DIAGNOSIS — R7309 Other abnormal glucose: Secondary | ICD-10-CM | POA: Diagnosis not present

## 2023-10-02 DIAGNOSIS — Z6831 Body mass index (BMI) 31.0-31.9, adult: Secondary | ICD-10-CM | POA: Diagnosis not present

## 2023-10-02 DIAGNOSIS — E782 Mixed hyperlipidemia: Secondary | ICD-10-CM | POA: Diagnosis not present

## 2023-10-02 LAB — POCT URINALYSIS DIP (MANUAL ENTRY)
Bilirubin, UA: NEGATIVE
Blood, UA: NEGATIVE
Glucose, UA: NEGATIVE mg/dL
Ketones, POC UA: NEGATIVE mg/dL
Leukocytes, UA: NEGATIVE
Nitrite, UA: NEGATIVE
Protein Ur, POC: NEGATIVE mg/dL
Spec Grav, UA: 1.015
Urobilinogen, UA: 2 U/dL — AB
pH, UA: 7

## 2023-10-02 MED ORDER — LIDOCAINE VISCOUS HCL 2 % MT SOLN
15.0000 mL | Freq: Once | OROMUCOSAL | Status: AC
  Administered 2023-10-02: 15 mL via OROMUCOSAL

## 2023-10-02 MED ORDER — ALUM & MAG HYDROXIDE-SIMETH 200-200-20 MG/5ML PO SUSP
30.0000 mL | Freq: Once | ORAL | Status: AC
  Administered 2023-10-02: 30 mL via ORAL

## 2023-10-02 MED ORDER — OMEPRAZOLE 20 MG PO CPDR: 20.0000 mg | DELAYED_RELEASE_CAPSULE | Freq: Every day | ORAL | 0 refills | Status: DC

## 2023-10-02 NOTE — Discharge Instructions (Signed)
 Urine culture and cytology swab are pending.  You will be contacted if the pending test results are abnormal.  You also have access to your results via MyChart. Refrain from sexual intercourse until your cytology test results have been received. If you test positive for an STI/STD, you will need to notify all partners.  You also need to refrain from sexual intercourse for at least 7 days after completing treatment if necessary.  For your reflux symptoms: Take medication as prescribed. Recommend dietary changes to include avoiding red meats, dairy, spicy foods, tomato-based foods, or mint based foods while symptoms persist. Try to eat at least 2 to 3 hours before bedtime. Recommend eating 6 smaller meals per day versus 3 large meals to help decrease reflux symptoms. May take over-the-counter Mylanta or Maalox as needed for breakthrough symptoms.  For your constipation: Dietary changes to increase the fiber in your diet. Make sure you are drinking at least 8-10 8 ounce glasses of water daily. Try to remain active. As discussed, if symptoms fail to improve, recommend following up with your PCP to discuss referral to gastroenterology for further evaluation.  Follow-up as needed.

## 2023-10-02 NOTE — ED Provider Notes (Signed)
 [facility]    CSN: [id number] Arrival date & time: 10/02/23  1859      History   Chief Complaint No chief complaint on file.   HPI Philip Howard is a 58 y.o. male.   The history is provided by the patient.   Patient presents for complaints of pain with urination, lower abdominal pain, epigastric pain, with gas and bloating for the past several days.  Patient reports that he has had a relationship outside of his marriage.  He is concerned for STI/STD.  He denies penile discharge, decreased urine stream, urinary frequency, urgency, or hematuria.  Patient reports that he does have underlying history of reflux disease.  States that he was eating today and experienced an episode of nausea and vomiting.  He also states that he has not had a regular bowel movement today.  Patient denies fever, chills, chest pain, diarrhea, or rash.  Patient states that he is more concerned about the STI testing.  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   Weakness of extremity    Cocaine abuse (HCC)    Chronic diastolic congestive heart failure (HCC)    Smoker    Alcohol abuse    Hyperlipidemia    Acute ischemic stroke (HCC) 11/03/2016   Numbness on right side 11/02/2016   Hypokalemia 11/02/2016   Substance abuse (HCC) 11/02/2016   Vertebral artery occlusion, 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 Authorizing Provider  omeprazole (PRILOSEC) 20 MG capsule Take 1 capsule (20 mg total) by mouth daily. 10/02/23  Yes Leath-Warren, Belen Bowers, NP  losartan (COZAAR) 50 MG tablet Take 50 mg by mouth daily. 04/16/21   [provider]  rosuvastatin (CRESTOR) 20 MG tablet Take 20 mg by mouth daily. 04/16/21   [provider]    Family History Family History  Problem Relation Age of Onset   Cancer Mother    Heart attack Father     Social History Social History   Tobacco Use   Smoking status: Former    Current packs/day: 0.00    Types: Cigarettes    Quit date: 11/02/2016    Years since quitting: 6.9   Smokeless tobacco: Never  Substance Use Topics   Alcohol use: Yes    Alcohol/week: 35.0 standard drinks of alcohol    Types: 28 Cans of beer, 7 Shots of liquor per week   Drug use: Yes    Types: Marijuana     Allergies   Patient has no known allergies.   Review of Systems Review of Systems Per HPI  Physical Exam Triage Vital Signs ED Triage Vitals  Encounter Vitals Group     BP 10/02/23 1937 (!) 158/90     Systolic BP Percentile --      Diastolic BP Percentile --      Pulse Rate 10/02/23 1937 81     Resp 10/02/23 1937 18     Temp 10/02/23 1937 98.3 F (36.8 C)     Temp Source 10/02/23 1937 Oral     SpO2 10/02/23 1937 95 %     Weight --      Height --      Head Circumference --      Peak Flow --      Pain Score 10/02/23 1938 7     Pain Loc --      Pain Education --      Exclude from Growth Chart --    No data found.  Updated Vital Signs BP (!) 158/90 (BP Location: Right Arm)   Pulse 81   Temp 98.3 F (36.8 C) (Oral)   Resp 18   SpO2 95%   Visual Acuity Right Eye Distance:   Left Eye Distance:    Bilateral Distance:    Right Eye Near:   Left Eye Near:    Bilateral Near:     Physical Exam Vitals and nursing note reviewed.  Constitutional:      General: He is not in acute distress.    Appearance: Normal appearance.  HENT:     Head: Normocephalic.     Mouth/Throat:     Mouth: Mucous membranes are moist.  Eyes:     Extraocular Movements: Extraocular movements intact.     Conjunctiva/sclera: Conjunctivae normal.     Pupils: Pupils are equal, round, and reactive to light.  Cardiovascular:     Rate and Rhythm: Normal rate and regular rhythm.     Pulses: Normal pulses.     Heart sounds: Normal heart sounds.  Pulmonary:     Effort: Pulmonary effort is normal. No respiratory distress.     Breath sounds: Normal breath sounds. No stridor. No wheezing, rhonchi or rales.  Abdominal:     General: Bowel sounds are normal.     Palpations: Abdomen is soft.     Tenderness: There is abdominal tenderness. There is no right CVA tenderness or left CVA tenderness.  Genitourinary:    Comments: GU exam deferred, self swab performed  Musculoskeletal:     Cervical back: Normal range of motion.  Skin:    General: Skin is warm and dry.  Neurological:     General: No focal deficit present.     Mental Status: He is alert and oriented to person, place, and time.  Psychiatric:        Mood and Affect: Mood normal.        Behavior: Behavior normal.      UC Treatments / Results  Labs (all labs ordered are listed, but only abnormal results are displayed) Labs Reviewed  POCT URINALYSIS DIP (MANUAL ENTRY) - Abnormal; Notable for the following components:      Result Value   Urobilinogen, UA 2.0 (*)    All other components within normal limits  URINE CULTURE  CYTOLOGY, (ORAL, ANAL, URETHRAL) ANCILLARY ONLY    EKG   Radiology No results found.  Procedures Procedures (including critical care time)  Medications Ordered in UC Medications  alum & mag hydroxide-simeth (MAALOX/MYLANTA)  200-200-20 MG/5ML suspension 30 mL (30 mLs Oral Given 10/02/23 2008)  lidocaine (XYLOCAINE) 2 % viscous mouth solution 15 mL (15 mLs  Mouth/Throat Given 10/02/23 2008)    Initial Impression / Assessment and Plan / UC Course  I have reviewed the triage vital signs and the nursing notes.  Pertinent labs & imaging results that were available during my care of the patient were reviewed by me and considered in my medical decision making (see chart for details).  Urinalysis does not indicate an obvious UTI, elevated urobilinogen, will send for culture.  Cytology swab is pending.  GI cocktail was administered with some relief of patient's symptoms.  Will start patient on omeprazole 20 mg daily to help with reflux symptoms.  With regard to his constipation, recommended dietary changes to include increasing the fiber in his diet.  Patient also advised to drink plenty of fluids.  Patient was given strict ER follow-up precautions.  Patient was in agreement with this plan of care and verbalizes understanding.  All questions were answered.  Patient stable for discharge.   Final Clinical Impressions(s) / UC Diagnoses   Final diagnoses:  Dysuria  Screening examination for sexually transmitted disease  Symptoms of gastroesophageal reflux  Constipation, unspecified constipation type     Discharge Instructions      Urine culture and cytology swab are pending.  You will be contacted if the pending test results are abnormal.  You also have access to your results via MyChart. Refrain from sexual intercourse until your cytology test results have been received. If you test positive for an STI/STD, you will need to notify all partners.  You also need to refrain from sexual intercourse for at least 7 days after completing treatment if necessary.  For your reflux symptoms: Take medication as prescribed. Recommend dietary changes to include avoiding red meats, dairy, spicy foods, tomato-based foods, or mint based  foods while symptoms persist. Try to eat at least 2 to 3 hours before bedtime. Recommend eating 6 smaller meals per day versus 3 large meals to help decrease reflux symptoms. May take over-the-counter Mylanta or Maalox as needed for breakthrough symptoms.  For your constipation: Dietary changes to increase the fiber in your diet. Make sure you are drinking at least 8-10 8 ounce glasses of water daily. Try to remain active. As discussed, if symptoms fail to improve, recommend following up with your PCP to discuss referral to gastroenterology for further evaluation.  Follow-up as needed.     ED Prescriptions     Medication Sig Dispense Auth. Provider   omeprazole (PRILOSEC) 20 MG capsule Take 1 capsule (20 mg total) by mouth daily. 30 capsule Leath-Warren, Belen Bowers, NP      PDMP not reviewed this encounter.   Hardy Lia, NP 10/02/23 2022

## 2023-10-02 NOTE — ED Triage Notes (Signed)
 Burning on urination x 3 days.  States lower Abd pain x 3 weeks.

## 2023-10-03 LAB — CYTOLOGY, (ORAL, ANAL, URETHRAL) ANCILLARY ONLY
Chlamydia: NEGATIVE
Comment: NEGATIVE
Comment: NEGATIVE
Comment: NORMAL
Neisseria Gonorrhea: NEGATIVE
Trichomonas: NEGATIVE

## 2023-10-04 LAB — URINE CULTURE: Culture: NO GROWTH

## 2023-10-08 ENCOUNTER — Telehealth: Payer: Self-pay

## 2023-10-08 NOTE — Telephone Encounter (Signed)
 Returned pt's call in regard to lab results from 10/02/2023, results given pt verbalized understanding of results.

## 2023-11-24 ENCOUNTER — Encounter: Payer: Self-pay | Admitting: Gastroenterology

## 2023-12-26 DIAGNOSIS — K219 Gastro-esophageal reflux disease without esophagitis: Secondary | ICD-10-CM | POA: Diagnosis not present

## 2023-12-26 DIAGNOSIS — F419 Anxiety disorder, unspecified: Secondary | ICD-10-CM | POA: Diagnosis not present

## 2023-12-26 DIAGNOSIS — I10 Essential (primary) hypertension: Secondary | ICD-10-CM | POA: Diagnosis not present

## 2023-12-31 NOTE — Progress Notes (Unsigned)
 TIN ENGRAM [id number] [birth year]   Chief Complaint: Abdominal pain  Referring Provider: Benjamine Aland, MD Primary GI MD: Sampson  HPI: JARYAN CHICOINE is a 58 y.o. male with past medical history of GERD, high cholesterol, HTN, stroke 2018, adenomatous colon polyp 2022 who presents today for a complaint of abdominal pain.    Patient seen in urgent care 10/02/2023.  Reported pain with urination, lower abdominal pain, epigastric pain, gas and bloating ongoing for several days.  Was concerned for STI/STD.  Urinalysis did not indicate obvious UTI.  GI cocktail was administered with some relief of patient's symptoms and he was started on omeprazole  20 mg daily to help with reflux symptoms.  Also reported constipation and was advised to increase fiber in his diet and increase fluid intake.  Labs negative for GC/chlamydia/trichomonas.  Abdominal x-ray 10/02/2023 showed nonobstructive bowel gas pattern and no significant stool burden.    Patient here today with his wife.  He states he has been having epigastric pain ongoing for about a year.  States the area is tender to touch and worse when he is laying down at night.  He also notices heartburn and reflux when he lays down at night.  States that heartburn and reflux have been ongoing for about a year as well.  He has previously tried omeprazol prescribed by urgent care provider as well as by one of his other providers in the past.  States that omeprazole  has not helped.  Epigastric pain is present constantly, though severity can fluctuate.  Feels like burning pain.  He can also have generalized abdominal pain.  He denies prior EGD.  Has been told in the past that he has a hiatal hernia.  He has been having frequent nausea as well.  Reports he has some vomiting/reflux of clear liquids.  He denies any unintentional weight loss or dysphagia.  For the last 3 to 4 months he has been having constipation.  Used to have a bowel movement  every morning, now having a bowel movement every 2 to 3 days with straining.  He denies any blood in his stool.  Has not tried increasing dietary fiber, adding a fiber supplement, or taking any laxatives.  He denies any family history of esophageal, stomach, or colon cancer.  Denies chest pain or shortness of breath.  History of stroke 2018, denies use of blood thinners.  Previous GI Procedures/Imaging   Colonoscopy 05/15/2021 (Dr. Cindie, [facility]) - Non- bleeding internal hemorrhoids.  - One 5 mm polyp in the descending colon, removed with a cold snare. Resected and retrieved.  - One 8 mm polyp in the descending colon, removed with a cold snare. Resected and retrieved.  - Two 4 to 6 mm polyps in the rectum, removed with a cold snare. Resected and retrieved. - Recall 5 years Path: A. COLON, DESCENDING, POLYPECTOMY:  -  Multiple fragments of tubular adenoma(s)  -  No high-grade dysplasia or malignancy identified   B. RECTUM, POLYPECTOMY:  -  Hyperplastic polyp (4 of 4 fragments)  -  No high-grade dysplasia or malignancy identified   Past Medical History:  Diagnosis Date   Cervicalgia    GERD (gastroesophageal reflux disease)    Hypercholesteremia    Hyperplastic rectal polyp 05/15/2021   Hypertension    Stroke (HCC) 10/2016   Tubular adenoma of colon 05/15/2021    Past Surgical History:  Procedure Laterality Date   arthroscopic surgery     COLONOSCOPY WITH PROPOFOL  N/A 05/15/2021  Procedure: COLONOSCOPY WITH PROPOFOL ;  Surgeon: Cindie Carlin POUR, DO;  [facility];  Service: Endoscopy;  Laterality: N/A;  2:30 / ASA II   POLYPECTOMY  05/15/2021   Procedure: POLYPECTOMY;  Surgeon: Cindie Carlin POUR, DO;  [facility];  Service: Endoscopy;;    Current Outpatient Medications  Medication Sig Dispense Refill   losartan (COZAAR) 50 MG tablet Take 50 mg by mouth daily.     omeprazole  (PRILOSEC) 20 MG capsule Take 1 capsule (20 mg total) by mouth  daily. 30 capsule 0   rosuvastatin (CRESTOR) 20 MG tablet Take 20 mg by mouth daily.     No current facility-administered medications for this visit.    Allergies as of 01/01/2024   (No Known Allergies)    Family History  Problem Relation Age of Onset   Cancer Mother    Heart attack Father     Social History   Tobacco Use   Smoking status: Former    Current packs/day: 0.00    Types: Cigarettes    Quit date: 11/02/2016    Years since quitting: 7.1   Smokeless tobacco: Never  Substance Use Topics   Alcohol use: Yes    Alcohol/week: 35.0 standard drinks of alcohol    Types: 28 Cans of beer, 7 Shots of liquor per week   Drug use: Yes    Types: Marijuana     Review of Systems:    Constitutional: No weight loss, fever, chills, weakness or fatigue Skin: No rash or itching Cardiovascular: No chest pain, chest pressure or palpitations   Respiratory: No SOB or cough Gastrointestinal: See HPI and otherwise negative Neurological: No headache, dizziness or syncope Musculoskeletal: No new muscle or joint pain Hematologic: No bleeding or bruising    Physical Exam:  Vital signs: BP 120/78   Pulse 64   Ht 5' 5 (1.651 m)   Wt 200 lb 9.6 oz (91 kg)   SpO2 98%   BMI 33.38 kg/m    Constitutional: Pleasant, obese male in NAD, alert and cooperative Head:  Normocephalic and atraumatic.  Eyes: No scleral icterus.  Respiratory: Respirations even and unlabored. Lungs clear to auscultation bilaterally.  No wheezes, crackles, or rhonchi.  Cardiovascular:  Regular rate and rhythm. No murmurs. No peripheral edema. Gastrointestinal:  Soft, nondistended, mild diffuse tenderness to palpation. No rebound or guarding. Normal bowel sounds. No appreciable masses or hepatomegaly. Rectal:  Not performed.  Neurologic:  Alert and oriented x4;  grossly normal neurologically.  Skin:   Dry and intact without significant lesions or rashes. Psychiatric: Oriented to person, place and time.  Demonstrates good judgement and reason without abnormal affect or behaviors.   RELEVANT LABS AND IMAGING: CBC    Component Value Date/Time   WBC 6.0 07/21/2017 1504   RBC 4.57 07/21/2017 1504   HGB 14.6 07/21/2017 1526   HCT 43.0 07/21/2017 1526   PLT 250 07/21/2017 1504   MCV 91.2 07/21/2017 1504   MCH 29.3 07/21/2017 1504   MCHC 32.1 07/21/2017 1504   RDW 15.2 07/21/2017 1504   LYMPHSABS 2.2 07/21/2017 1504   MONOABS 0.5 07/21/2017 1504   EOSABS 0.1 07/21/2017 1504   BASOSABS 0.0 07/21/2017 1504    CMP     Component Value Date/Time   NA 139 07/21/2017 1526   K 3.9 07/21/2017 1526   CL 105 07/21/2017 1526   CO2 22 07/21/2017 1504   GLUCOSE 107 (H) 07/21/2017 1526   BUN 26 (H) 07/21/2017 1526   CREATININE  1.10 07/21/2017 1526   CALCIUM  9.3 07/21/2017 1504   PROT 6.9 07/21/2017 1504   ALBUMIN 4.1 07/21/2017 1504   AST 54 (H) 07/21/2017 1504   ALT 97 (H) 07/21/2017 1504   ALKPHOS 58 07/21/2017 1504   BILITOT 0.2 (L) 07/21/2017 1504   GFRNONAA >60 07/21/2017 1504   GFRAA >60 07/21/2017 1504   Echocardiogram 11/03/2016 Study Conclusions  - Left ventricle: The cavity size was normal. Wall thickness was    normal. Systolic function was normal. The estimated ejection    fraction was in the range of 55% to 60%. Wall motion was normal;    there were no regional wall motion abnormalities. Doppler    parameters are consistent with abnormal left ventricular    relaxation (grade 1 diastolic dysfunction).  - Right atrium: The atrium was mildly dilated.  Impressions:  - Normal LV systolic function; mild diastolic dysfunction; mild    RAE/RVE.   Assessment/Plan:   GERD Nausea Epigastric pain Patient reporting about 1 year of epigastric pain, heartburn, acid reflux, and nausea.  Has not had improvement on omeprazole .  No prior EGD.  - Schedule EGD. I thoroughly discussed the procedure with the patient to include nature of the procedure, alternatives, benefits, and risks  (including but not limited to bleeding, infection, perforation, anesthesia/cardiac/pulmonary complications). Patient verbalized understanding and gave verbal consent to proceed with procedure.  - Start Protonix  40 mg - Check labs today: CBC, CMP, lipase, TSH, TTG, IgA  Constipation Change in bowel habits Patient reports about 3 to 4 months of constipation.  Previously would have a bowel movement daily, now having a bowel movement every 2 to 3 days with straining. He has not tried any dietary or lifestyle modifications, has not tried a fiber supplement or OTC laxatives.  Not having any rectal bleeding. Last colonoscopy 2022 with findings of 2 tubular adenomas and internal hemorrhoids, 5-year recall recommended.  - Increase water intake and dietary fiber. - Start Benefiber supplement, 1 tablespoon daily - Start MiraLAX 1 capful daily.  Adjust dose to response. - Colonoscopy up-to-date, but could consider repeat colonoscopy if symptoms persist.   Camie Furbish, PA-C [facility] 12/31/2023, 10:13 AM  Patient Care Team: Benjamine Aland, MD as PCP - General (Family Medicine) Cindie Carlin POUR, DO as Consulting Physician (Gastroenterology)

## 2024-01-01 ENCOUNTER — Ambulatory Visit: Admitting: Gastroenterology

## 2024-01-01 ENCOUNTER — Other Ambulatory Visit

## 2024-01-01 ENCOUNTER — Encounter: Payer: Self-pay | Admitting: Gastroenterology

## 2024-01-01 VITALS — BP 120/78 | HR 64 | Ht 65.0 in | Wt 200.6 lb

## 2024-01-01 DIAGNOSIS — R1013 Epigastric pain: Secondary | ICD-10-CM

## 2024-01-01 DIAGNOSIS — Z8719 Personal history of other diseases of the digestive system: Secondary | ICD-10-CM

## 2024-01-01 DIAGNOSIS — R110 Nausea: Secondary | ICD-10-CM | POA: Diagnosis not present

## 2024-01-01 DIAGNOSIS — Z860101 Personal history of adenomatous and serrated colon polyps: Secondary | ICD-10-CM

## 2024-01-01 DIAGNOSIS — K5900 Constipation, unspecified: Secondary | ICD-10-CM | POA: Diagnosis not present

## 2024-01-01 DIAGNOSIS — R112 Nausea with vomiting, unspecified: Secondary | ICD-10-CM

## 2024-01-01 DIAGNOSIS — K219 Gastro-esophageal reflux disease without esophagitis: Secondary | ICD-10-CM | POA: Diagnosis not present

## 2024-01-01 LAB — COMPREHENSIVE METABOLIC PANEL WITH GFR
ALT: 26 U/L (ref 0–53)
AST: 22 U/L (ref 0–37)
Albumin: 4.5 g/dL (ref 3.5–5.2)
Alkaline Phosphatase: 57 U/L (ref 39–117)
BUN: 25 mg/dL — ABNORMAL HIGH (ref 6–23)
CO2: 28 meq/L (ref 19–32)
Calcium: 9.4 mg/dL (ref 8.4–10.5)
Chloride: 103 meq/L (ref 96–112)
Creatinine, Ser: 1.34 mg/dL (ref 0.40–1.50)
GFR: 57.62 mL/min — ABNORMAL LOW (ref >60.00)
Glucose, Bld: 114 mg/dL — ABNORMAL HIGH (ref 70–99)
Potassium: 4.4 meq/L (ref 3.5–5.1)
Sodium: 138 meq/L (ref 135–145)
Total Bilirubin: 0.4 mg/dL (ref 0.2–1.2)
Total Protein: 7.1 g/dL (ref 6.0–8.3)

## 2024-01-01 LAB — CBC WITH DIFFERENTIAL/PLATELET
Basophils Absolute: 0.1 K/uL (ref 0.0–0.1)
Basophils Relative: 1.2 % (ref 0.0–3.0)
Eosinophils Absolute: 0.1 K/uL (ref 0.0–0.7)
Eosinophils Relative: 0.9 % (ref 0.0–5.0)
HCT: 44.3 % (ref 39.0–52.0)
Hemoglobin: 14.6 g/dL (ref 13.0–17.0)
Lymphocytes Relative: 28.2 % (ref 12.0–46.0)
Lymphs Abs: 1.7 K/uL (ref 0.7–4.0)
MCHC: 32.9 g/dL (ref 30.0–36.0)
MCV: 91.6 fl (ref 78.0–100.0)
Monocytes Absolute: 0.6 K/uL (ref 0.1–1.0)
Monocytes Relative: 9.5 % (ref 3.0–12.0)
Neutro Abs: 3.6 K/uL (ref 1.4–7.7)
Neutrophils Relative %: 60.2 % (ref 43.0–77.0)
Platelets: 255 K/uL (ref 150.0–400.0)
RBC: 4.84 Mil/uL (ref 4.22–5.81)
RDW: 14.7 % (ref 11.5–15.5)
WBC: 5.9 K/uL (ref 4.0–10.5)

## 2024-01-01 LAB — LIPASE: Lipase: 19 U/L (ref 11.0–59.0)

## 2024-01-01 LAB — TSH: TSH: 2.45 u[IU]/mL (ref 0.35–5.50)

## 2024-01-01 MED ORDER — PANTOPRAZOLE SODIUM 40 MG PO TBEC: 40.0000 mg | DELAYED_RELEASE_TABLET | Freq: Every day | ORAL | 3 refills | Status: DC

## 2024-01-01 NOTE — Patient Instructions (Addendum)
 Your provider has requested that you go to the basement level for lab work before leaving today. Press B on the elevator. The lab is located at the first door on the left as you exit the elevator.  Due to recent changes in healthcare laws, you may see the results of your imaging and laboratory studies on MyChart before your provider has had a chance to review them.  We understand that in some cases there may be results that are confusing or concerning to you. Not all laboratory results come back in the same time frame and the provider may be waiting for multiple results in order to interpret others.  Please give us  48 hours in order for your provider to thoroughly review all the results before contacting the office for clarification of your results.   We have sent the following medications to your pharmacy for you to pick up at your convenience: Pantoprazole  40mg  once daily  Start a fiber supplement such as Benefiber.  Take 1 tablespoon once or twice daily to keep bowels regular if needed.  Start Miralax 1 capful daily in 8 ounces of liquid.  You have been scheduled for an endoscopy. Please follow written instructions given to you at your visit today.  If you use inhalers (even only as needed), please bring them with you on the day of your procedure.  If you take any of the following medications, they will need to be adjusted prior to your procedure:   DO NOT TAKE 7 DAYS PRIOR TO TEST- Trulicity (dulaglutide) Ozempic, Wegovy (semaglutide) Mounjaro (tirzepatide) Bydureon Bcise (exanatide extended release)  DO NOT TAKE 1 DAY PRIOR TO YOUR TEST Rybelsus (semaglutide) Adlyxin (lixisenatide) Victoza (liraglutide) Byetta (exanatide) ___________________________________________________________________________     Follow up after your procedure.  Thank you for trusting me with your gastrointestinal care!   Camie Furbish, PA-C  _______________________________________________________  If  your blood pressure at your visit was 140/90 or greater, please contact your primary care physician to follow up on this.  _______________________________________________________  If you are age 58 or older, your body mass index should be between 23-30. Your Body mass index is 33.38 kg/m. If this is out of the aforementioned range listed, please consider follow up with your Primary Care Provider.  If you are age 64 or younger, your body mass index should be between 19-25. Your Body mass index is 33.38 kg/m. If this is out of the aformentioned range listed, please consider follow up with your Primary Care Provider.   ________________________________________________________  The [facility] providers would like to encourage you to use MYCHART to communicate with providers for non-urgent requests or questions.  Due to long hold times on the telephone, sending your provider a message by [facility] may be a faster and more efficient way to get a response.  Please allow 48 business hours for a response.  Please remember that this is for non-urgent requests.  _______________________________________________________

## 2024-01-02 ENCOUNTER — Telehealth: Payer: Self-pay | Admitting: Gastroenterology

## 2024-01-02 ENCOUNTER — Telehealth: Payer: Self-pay

## 2024-01-02 ENCOUNTER — Other Ambulatory Visit (HOSPITAL_COMMUNITY): Payer: Self-pay

## 2024-01-02 NOTE — Telephone Encounter (Signed)
 Pharmacy Patient Advocate Encounter   Received notification from Pt Calls Messages that prior authorization for Pantoprazole  Sodium 40MG  dr tablets is required/requested.   Insurance verification completed.   The patient is insured through [employer] .   Per test claim: PA required; PA submitted to above mentioned insurance via CoverMyMeds Key/confirmation #/EOC [id number] Status is pending

## 2024-01-02 NOTE — Telephone Encounter (Signed)
 PT is calling to update that the insurance will not cover pantoprazole  and he needs a prior authorization

## 2024-01-03 ENCOUNTER — Other Ambulatory Visit (HOSPITAL_COMMUNITY): Payer: Self-pay

## 2024-01-03 LAB — TISSUE TRANSGLUTAMINASE ABS,IGG,IGA
(tTG) Ab, IgA: <1 U/mL
(tTG) Ab, IgG: <1 U/mL

## 2024-01-03 LAB — IGA: Immunoglobulin A: 155 mg/dL (ref 47–310)

## 2024-01-03 NOTE — Progress Notes (Signed)
 ____________________________________________________________  Attending physician addendum:  Thank you for sending this case to me. I have reviewed the entire note and agree with the plan.   Amada Jupiter, MD  ____________________________________________________________

## 2024-01-05 ENCOUNTER — Encounter: Payer: Self-pay | Admitting: Gastroenterology

## 2024-01-05 ENCOUNTER — Ambulatory Visit: Payer: Self-pay | Admitting: Gastroenterology

## 2024-01-05 ENCOUNTER — Ambulatory Visit (AMBULATORY_SURGERY_CENTER): Admitting: Gastroenterology

## 2024-01-05 VITALS — BP 129/70 | HR 89 | Temp 97.9°F | Resp 14 | Ht 65.0 in | Wt 200.0 lb

## 2024-01-05 DIAGNOSIS — K449 Diaphragmatic hernia without obstruction or gangrene: Secondary | ICD-10-CM

## 2024-01-05 DIAGNOSIS — K219 Gastro-esophageal reflux disease without esophagitis: Secondary | ICD-10-CM

## 2024-01-05 DIAGNOSIS — K2950 Unspecified chronic gastritis without bleeding: Secondary | ICD-10-CM

## 2024-01-05 DIAGNOSIS — K3189 Other diseases of stomach and duodenum: Secondary | ICD-10-CM

## 2024-01-05 MED ORDER — SODIUM CHLORIDE 0.9 % IV SOLN: 500.0000 mL | INTRAVENOUS | Status: DC

## 2024-01-05 NOTE — Progress Notes (Signed)
 Called to room to assist during endoscopic procedure.  Patient ID and intended procedure confirmed with present staff. Received instructions for my participation in the procedure from the performing physician.

## 2024-01-05 NOTE — Progress Notes (Signed)
 No significant changes to clinical history since GI office visit on 01/01/24. Patient has burning pain and tenderness directly over his xyphoid process on exam. Also has intermittent reflux symptoms.  The patient is appropriate for an endoscopic procedure in the ambulatory setting.  - Victory Brand, MD

## 2024-01-05 NOTE — Op Note (Signed)
 [facility] Patient Name: Philip Howard Procedure Date: 01/05/2024 1:35 PM MRN: [id number] Endoscopist: Victory L. Legrand , MD, [id number] Age: 58 Referring MD:  Date of Birth: [birth year] Gender: Male Account #: [id number] Procedure:                Upper GI endoscopy Indications:              Epigastric abdominal pain, Suspected esophageal                            reflux (Patient reports occasional episodes)                           clinical details in recent office note Medicines:                Monitored Anesthesia Care Procedure:                Pre-Anesthesia Assessment:                           - Prior to the procedure, a History and Physical                            was performed, and patient medications and                            allergies were reviewed. The patient's tolerance of                            previous anesthesia was also reviewed. The risks                            and benefits of the procedure and the sedation                            options and risks were discussed with the patient.                            All questions were answered, and informed consent                            was obtained. Prior Anticoagulants: The patient has                            taken no anticoagulant or antiplatelet agents. ASA                            Grade Assessment: III - A patient with severe                            systemic disease. After reviewing the risks and                            benefits, the patient was deemed in satisfactory  condition to undergo the procedure.                           After obtaining informed consent, the endoscope was                            passed under direct vision. Throughout the                            procedure, the patient's blood pressure, pulse, and                            oxygen saturations were monitored continuously. The                            GIF [id number]  #[id number] was introduced through the                            mouth, and advanced to the second part of duodenum.                            The upper GI endoscopy was accomplished without                            difficulty. The patient tolerated the procedure                            fairly well. Scope In: Scope Out: Findings:                 A 5 cm hiatal hernia was present. (axial and                            transverse dimensions)                           There is no endoscopic evidence of esophagitis in                            the entire esophagus.                           Diffuse congested mucosa was found in the gastric                            body. Biopsies were taken with a cold forceps for                            histology. (antrum and body in one pathology jar to                            r/o H pylori)                           The exam of the stomach was otherwise normal.  The cardia and gastric fundus were normal on                            retroflexion.                           Localized mildly erythematous mucosa was found in                            the duodenal bulb.                           The exam of the duodenum was otherwise normal. Complications:            No immediate complications. Estimated Blood Loss:     Estimated blood loss was minimal. Impression:               - 5 cm hiatal hernia.                           - Congestive gastropathy. Questionable clinical                            significance. Biopsied.                           - Erythematous duodenopathy. Mild, nonspecific                            finding of questionable clinical significance.                           The patient's main complaint of epigastric pain                            bringing them to the office was pain localized to                            an area of focal tenderness over the xyphoid                            process on  exam today. Recommendation:           - Patient has a contact number available for                            emergencies. The signs and symptoms of potential                            delayed complications were discussed with the                            patient. Return to normal activities tomorrow.                            Written discharge instructions were provided to the  patient.                           - Resume previous diet.                           - Continue present medications.                           - Follow an antireflux regimen indefinitely. Kang Ishida L. Legrand, MD 01/05/2024 2:13:35 PM This report has been signed electronically.

## 2024-01-05 NOTE — Progress Notes (Signed)
 Sedate, gd SR, tolerated procedure well, VSS, report to RN

## 2024-01-05 NOTE — Progress Notes (Signed)
 Pt's states no medical or surgical changes since previsit or office visit.

## 2024-01-05 NOTE — Patient Instructions (Addendum)
-   Resume previous diet. - Continue present medications. - Follow an antireflux regimen indefinitely. - Await pathology  YOU HAD AN ENDOSCOPIC PROCEDURE TODAY AT THE [facility]:   Refer to the procedure report that was given to you for any specific questions about what was found during the examination.  If the procedure report does not answer your questions, please call your gastroenterologist to clarify.  If you requested that your care partner not be given the details of your procedure findings, then the procedure report has been included in a sealed envelope for you to review at your convenience later.  YOU SHOULD EXPECT: Some feelings of bloating in the abdomen. Passage of more gas than usual.  Walking can help get rid of the air that was put into your GI tract during the procedure and reduce the bloating. If you had a lower endoscopy (such as a colonoscopy or flexible sigmoidoscopy) you may notice spotting of blood in your stool or on the toilet paper. If you underwent a bowel prep for your procedure, you may not have a normal bowel movement for a few days.  Please Note:  You might notice some irritation and congestion in your nose or some drainage.  This is from the oxygen used during your procedure.  There is no need for concern and it should clear up in a day or so.  SYMPTOMS TO REPORT IMMEDIATELY:  Following upper endoscopy (EGD)  Vomiting of blood or coffee ground material  New chest pain or pain under the shoulder blades  Painful or persistently difficult swallowing  New shortness of breath  Fever of 100F or higher  Black, tarry-looking stools  For urgent or emergent issues, a gastroenterologist can be reached at any hour by calling [phone]. Do not use MyChart messaging for urgent concerns.    DIET:  We do recommend a small meal at first, but then you may proceed to your regular diet.  Drink plenty of fluids but you should avoid alcoholic beverages for 24  hours.  ACTIVITY:  You should plan to take it easy for the rest of today and you should NOT DRIVE or use heavy machinery until tomorrow (because of the sedation medicines used during the test).    FOLLOW UP: Our staff will call the number listed on your records the next business day following your procedure.  We will call around 7:15- 8:00 am to check on you and address any questions or concerns that you may have regarding the information given to you following your procedure. If we do not reach you, we will leave a message.     If any biopsies were taken you will be contacted by phone or by letter within the next 1-3 weeks.  Please call us  at [phone] if you have not heard about the biopsies in 3 weeks.    SIGNATURES/CONFIDENTIALITY: You and/or your care partner have signed paperwork which will be entered into your electronic medical record.  These signatures attest to the fact that that the information above on your After Visit Summary has been reviewed and is understood.  Full responsibility of the confidentiality of this discharge information lies with you and/or your care-partner.

## 2024-01-05 NOTE — Telephone Encounter (Signed)
 Pharmacy Patient Advocate Encounter  Received notification from [facility] that Prior Authorization for  Pantoprazole  Sodium 40MG  dr tablets has been APPROVED from 01-03-2024 to 01-02-2025   PA #/Case ID/Reference #: [id number]

## 2024-01-06 ENCOUNTER — Telehealth: Payer: Self-pay | Admitting: *Deleted

## 2024-01-06 NOTE — Telephone Encounter (Signed)
No answer and unable to leave VM

## 2024-01-08 LAB — SURGICAL PATHOLOGY

## 2024-01-12 ENCOUNTER — Ambulatory Visit: Payer: Self-pay | Admitting: Gastroenterology

## 2024-02-11 ENCOUNTER — Ambulatory Visit: Admitting: Gastroenterology

## 2024-02-11 ENCOUNTER — Encounter: Payer: Self-pay | Admitting: Gastroenterology

## 2024-02-11 VITALS — BP 144/82 | HR 58 | Ht 65.0 in | Wt 204.4 lb

## 2024-02-11 DIAGNOSIS — R1013 Epigastric pain: Secondary | ICD-10-CM

## 2024-02-11 DIAGNOSIS — K5900 Constipation, unspecified: Secondary | ICD-10-CM | POA: Diagnosis not present

## 2024-02-11 DIAGNOSIS — K449 Diaphragmatic hernia without obstruction or gangrene: Secondary | ICD-10-CM

## 2024-02-11 DIAGNOSIS — K219 Gastro-esophageal reflux disease without esophagitis: Secondary | ICD-10-CM | POA: Diagnosis not present

## 2024-02-11 MED ORDER — PANTOPRAZOLE SODIUM 40 MG PO TBEC: 40.0000 mg | DELAYED_RELEASE_TABLET | Freq: Two times a day (BID) | ORAL | 5 refills | Status: DC

## 2024-02-11 MED ORDER — POLYETHYLENE GLYCOL 3350 17 G PO PACK: 17.0000 g | PACK | Freq: Every day | ORAL | Status: AC | PRN

## 2024-02-11 NOTE — Patient Instructions (Addendum)
 We have sent the following medications to your pharmacy for you to pick up at your convenience: Protonix  40mg  Take twice daily  We are giving you a GERD handout today.  Increase your dietary fiber and water intake  Please add a fiber supplement  Please purchase the following medications over the counter and take as directed: Miralax  - Take as directed, daily  We are giving you a handout re: Hiatal Hernia.  You have been scheduled for a follow up appointment on Tuesday, 03-30-24 at 10:00am. Please arrive 10 minutes early for registration. If you need to reschedule or cancel this appointment please call [phone] as soon as possible. Thank you.   Thank you for entrusting me with your care and for choosing [facility], [place], [place]  _______________________________________________________  If your blood pressure at your visit was 140/90 or greater, please contact your primary care physician to follow up on this.  _______________________________________________________  If you are age 42 or older, your body mass index should be between 23-30. Your Body mass index is 34.01 kg/m. If this is out of the aforementioned range listed, please consider follow up with your Primary Care Provider.  If you are age 44 or younger, your body mass index should be between 19-25. Your Body mass index is 34.01 kg/m. If this is out of the aformentioned range listed, please consider follow up with your Primary Care Provider.   ________________________________________________________  The [facility] providers would like to encourage you to use MYCHART to communicate with providers for non-urgent requests or questions.  Due to long hold times on the telephone, sending your provider a message by [facility] may be a faster and more efficient way to get a response.  Please allow 48 business hours for a response.  Please remember that this is for non-urgent requests.   _______________________________________________________  [facility] is using a team-based approach to care.  Your team is made up of your doctor and two to three APPS. Our APPS (Nurse Practitioners and Physician Assistants) work with your physician to ensure care continuity for you. They are fully qualified to address your health concerns and develop a treatment plan. They communicate directly with your gastroenterologist to care for you. Seeing the Advanced Practice Practitioners on your physician's team can help you by facilitating care more promptly, often allowing for earlier appointments, access to diagnostic testing, procedures, and other specialty referrals.      _______________________________________________________  If your blood pressure at your visit was 140/90 or greater, please contact your primary care physician to follow up on this.  _______________________________________________________  If you are age 43 or older, your body mass index should be between 23-30. Your Body mass index is 34.01 kg/m. If this is out of the aforementioned range listed, please consider follow up with your Primary Care Provider.  If you are age 63 or younger, your body mass index should be between 19-25. Your Body mass index is 34.01 kg/m. If this is out of the aformentioned range listed, please consider follow up with your Primary Care Provider.   ________________________________________________________  The [facility] providers would like to encourage you to use MYCHART to communicate with providers for non-urgent requests or questions.  Due to long hold times on the telephone, sending your provider a message by [facility] may be a faster and more efficient way to get a response.  Please allow 48 business hours for a response.  Please remember that this is for non-urgent requests.  _______________________________________________________  [facility]  is using a team-based  approach to care.  Your team is made up of your doctor and two to three APPS. Our APPS (Nurse Practitioners and Physician Assistants) work with your physician to ensure care continuity for you. They are fully qualified to address your health concerns and develop a treatment plan. They communicate directly with your gastroenterologist to care for you. Seeing the Advanced Practice Practitioners on your physician's team can help you by facilitating care more promptly, often allowing for earlier appointments, access to diagnostic testing, procedures, and other specialty referrals.

## 2024-02-11 NOTE — Progress Notes (Signed)
 LIBERATO Howard [id number] 01-Nov-1965   Chief Complaint: GERD  Referring Provider: Benjamine Aland, MD Primary GI MD: Dr. Legrand  HPI: Philip Howard is a 58 y.o. male with past medical history of GERD, high cholesterol, HTN, stroke 2018, adenomatous colon polyp 2022 who presents today for follow up.  Patient seen in urgent care 10/02/2023.  Reported pain with urination, lower abdominal pain, epigastric pain, gas and bloating ongoing for several days.  Was concerned for STI/STD.  Urinalysis did not indicate obvious UTI.  GI cocktail was administered with some relief of patient's symptoms and he was started on omeprazole  20 mg daily to help with reflux symptoms.  Also reported constipation and was advised to increase fiber in his diet and increase fluid intake.   Labs negative for GC/chlamydia/trichomonas.   Abdominal x-ray 10/02/2023 showed nonobstructive bowel gas pattern and no significant stool burden.   At last visit 01/01/2024 patient reported 1 year of epigastric pain, heartburn, acid reflux, and nausea.  No improvement on omeprazole .  No prior EGD.  He was started on Protonix  and scheduled for upper endoscopy. Also endorsed 3 to 4 months of constipation, having a bowel movement every 2 to 3 days with straining.  Denied any rectal bleeding.  Last colonoscopy 2022 with findings of 2 tubular adenomas and internal hemorrhoids, 5-year recall.  He was advised to start Benefiber, MiraLAX , and increase water intake.  Labs at that time showed mildly decreased kidney function, otherwise unremarkable with negative celiac panel, normal thyroid function.  He underwent EGD 01/05/2024 with finding of hiatal hernia, congestive gastropathy, duodenopathy, negative for H. Pylori.   Patient states his symptoms have improved on Protonix , though he continues to have intermittent breakthrough symptoms about once a week, typically occurring when he lays down at night.  Denies identifiable food triggers.   Has not tried sleeping with the head of his bed elevated.  States he has some intermittent epigastric discomfort and points to xiphoid process as location of pain.  Not worsening.  Continues to have constipation but did not try fiber supplement or MiraLAX  as we discussed at last visit.  States he has been trying to drink more water.  Reports that he had been walking regularly but recently got out of the habit and so has not been exercising much.  Denies any nausea, vomiting, fever, chills, blood in stool, melena.  Previous GI Procedures/Imaging   EGD 01/05/2024 - 5 cm hiatal hernia.  - Congestive gastropathy. Questionable clinical significance. Biopsied.  - Erythematous duodenopathy. Mild, nonspecific finding of questionable clinical significance.  The patient' s main complaint of epigastric pain bringing them to the office was pain localized to an area of focal tenderness over the xyphoid process on exam today. Path: 1. Surgical [P], gastric antrum :       REACTIVE GASTROPATHY WITH MINIMAL CHRONIC GASTRITIS       NEGATIVE FOR H. PYLORI, INTESTINAL METAPLASIA, DYSPLASIA AND CARCINOMA   Colonoscopy 05/15/2021 (Dr. Cindie, [facility]) - Non- bleeding internal hemorrhoids.  - One 5 mm polyp in the descending colon, removed with a cold snare. Resected and retrieved.  - One 8 mm polyp in the descending colon, removed with a cold snare. Resected and retrieved.  - Two 4 to 6 mm polyps in the rectum, removed with a cold snare. Resected and retrieved. - Recall 5 years Path: A. COLON, DESCENDING, POLYPECTOMY:  -  Multiple fragments of tubular adenoma(s)  -  No high-grade dysplasia or malignancy identified  B. RECTUM, POLYPECTOMY:  -  Hyperplastic polyp (4 of 4 fragments)  -  No high-grade dysplasia or malignancy identified    Past Medical History:  Diagnosis Date   Cervicalgia    GERD (gastroesophageal reflux disease)    Hypercholesteremia    Hyperplastic rectal polyp  05/15/2021   Hypertension    Stroke (HCC) 10/2016   Tubular adenoma of colon 05/15/2021    Past Surgical History:  Procedure Laterality Date   arthroscopic surgery  2009   knee   COLONOSCOPY WITH PROPOFOL  N/A 05/15/2021   Procedure: COLONOSCOPY WITH PROPOFOL ;  Surgeon: Cindie Carlin POUR, DO;  Location: [facility];  Service: Endoscopy;  Laterality: N/A;  2:30 / ASA II   POLYPECTOMY  05/15/2021   Procedure: POLYPECTOMY;  Surgeon: Cindie Carlin POUR, DO;  Location: [facility];  Service: Endoscopy;;    Current Outpatient Medications  Medication Sig Dispense Refill   losartan (COZAAR) 50 MG tablet Take 50 mg by mouth daily.     pantoprazole  (PROTONIX ) 40 MG tablet Take 1 tablet (40 mg total) by mouth daily. 30 tablet 3   rosuvastatin (CRESTOR) 20 MG tablet Take 20 mg by mouth daily.     No current facility-administered medications for this visit.    Allergies as of 02/11/2024   (No Known Allergies)    Family History  Problem Relation Age of Onset   Cancer Mother    Breast cancer Mother    Heart attack Father    Colon cancer Neg Hx    Stomach cancer Neg Hx    Esophageal cancer Neg Hx    Rectal cancer Neg Hx     Social History   Tobacco Use   Smoking status: Former    Current packs/day: 0.00    Types: Cigarettes    Quit date: 11/02/2016    Years since quitting: 7.2   Smokeless tobacco: Never  Vaping Use   Vaping status: Never Used  Substance Use Topics   Alcohol use: Yes    Alcohol/week: 35.0 standard drinks of alcohol    Types: 28 Cans of beer, 7 Shots of liquor per week    Comment: just on the weekends   Drug use: Yes    Types: Marijuana    Comment: daily     Review of Systems:    Constitutional: No unexplained weight loss, fever, chills Cardiovascular: No chest pain Respiratory: No SOB Gastrointestinal: See HPI and otherwise negative    Physical Exam:  Vital signs: BP (!) 144/82 (BP Location: Left Arm, Patient Position: Sitting, Cuff Size: Normal)    Pulse (!) 58   Ht 5' 5 (1.651 m)   Wt 204 lb 6 oz (92.7 kg)   SpO2 96%   BMI 34.01 kg/m   Constitutional: Pleasant, obese male in NAD, alert and cooperative Head:  Normocephalic and atraumatic.  Eyes: No scleral icterus. Respiratory: Respirations even and unlabored. Lungs clear to auscultation bilaterally.  No wheezes, crackles, or rhonchi.  Cardiovascular:  Regular rate and rhythm. No murmurs. No peripheral edema. Gastrointestinal:  Soft, nondistended, nontender but endorses sensation of fullness on palpation and discomfort to palpation of xiphoid process. No rebound or guarding. Normal bowel sounds. No appreciable masses or hepatomegaly. Rectal:  Not performed.  Neurologic:  Alert and oriented x4;  grossly normal neurologically.  Skin:   Dry and intact without significant lesions or rashes. Psychiatric: Oriented to person, place and time. Demonstrates good judgement and reason without abnormal affect or behaviors.   RELEVANT LABS AND  IMAGING: CBC    Component Value Date/Time   WBC 5.9 01/01/2024 0952   RBC 4.84 01/01/2024 0952   HGB 14.6 01/01/2024 0952   HCT 44.3 01/01/2024 0952   PLT 255.0 01/01/2024 0952   MCV 91.6 01/01/2024 0952   MCH 29.3 07/21/2017 1504   MCHC 32.9 01/01/2024 0952   RDW 14.7 01/01/2024 0952   LYMPHSABS 1.7 01/01/2024 0952   MONOABS 0.6 01/01/2024 0952   EOSABS 0.1 01/01/2024 0952   BASOSABS 0.1 01/01/2024 0952    CMP     Component Value Date/Time   NA 138 01/01/2024 0952   K 4.4 01/01/2024 0952   CL 103 01/01/2024 0952   CO2 28 01/01/2024 0952   GLUCOSE 114 (H) 01/01/2024 0952   BUN 25 (H) 01/01/2024 0952   CREATININE 1.34 01/01/2024 0952   CALCIUM  9.4 01/01/2024 0952   PROT 7.1 01/01/2024 0952   ALBUMIN 4.5 01/01/2024 0952   AST 22 01/01/2024 0952   ALT 26 01/01/2024 0952   ALKPHOS 57 01/01/2024 0952   BILITOT 0.4 01/01/2024 0952   GFRNONAA >60 07/21/2017 1504   GFRAA >60 07/21/2017 1504   Echocardiogram 11/03/2016 Study  Conclusions  - Left ventricle: The cavity size was normal. Wall thickness was    normal. Systolic function was normal. The estimated ejection    fraction was in the range of 55% to 60%. Wall motion was normal;    there were no regional wall motion abnormalities. Doppler    parameters are consistent with abnormal left ventricular    relaxation (grade 1 diastolic dysfunction).  - Right atrium: The atrium was mildly dilated.  Impressions:  - Normal LV systolic function; mild diastolic dysfunction; mild    RAE/RVE.   Assessment/Plan:   GERD Hiatal hernia Epigastric pain Patient seen today for follow-up.  At last visit endorsed 1 year of epigastric pain, heartburn, acid reflux, and nausea without improvement on omeprazole  and no prior EGD.  We started him on Protonix  and he underwent EGD 01/05/2024 with finding of hiatal hernia, congestive gastropathy, duodenopathy, and negative biopsies for H. pylori. Today he states that symptoms overall have improved on Protonix  40 mg daily, though he continues to have some breakthrough symptoms about once a week which occur at night when he lays down. Discussed lifestyle modifications for GERD.  Discussed hiatal hernia.  At this time we will continue with medication management but in future if symptoms unable to be controlled, could consider antireflux surgery/hiatal hernia repair.  - Increase Protonix  to 40 mg twice daily - Lifestyle modifications for GERD - Handout given on hiatal hernia - Reassess at follow-up  Constipation Patient endorses constipation.  At last visit we discussed adding a fiber supplement and MiraLAX .  He has not tried either of these things.  Denies any blood in his stool or melena.  He is due for repeat colonoscopy in 2027, and we have discussed doing this earlier if constipation does not improve. He is agreeable to trying some lifestyle changes including increasing dietary fiber, water intake, and activity level with exercise.  He  will try fiber supplement and if symptoms persist will add on MiraLAX .  - Lifestyle modifications for management of constipation to include increasing water intake and dietary fiber - Recommend addition of fiber supplement such as Benefiber - Recommend addition of MiraLAX  if no improvement with changes above - If new or worsening symptoms, or if constipation not relieved by above measures, consider earlier repeat colonoscopy due to history of adenomatous colon polyps  Camie [place], PA-C [facility] 02/11/2024, 9:46 AM  Patient Care Team: Benjamine Aland, MD as PCP - General (Family Medicine) Cindie Carlin POUR, DO as Consulting Physician (Gastroenterology)

## 2024-02-12 NOTE — Progress Notes (Signed)
 ____________________________________________________________  Attending physician addendum:  Thank you for sending this case to me. I have reviewed the entire note and agree with the plan.   Victory Brand, MD  ____________________________________________________________

## 2024-03-30 ENCOUNTER — Encounter: Payer: Self-pay | Admitting: Gastroenterology

## 2024-03-30 ENCOUNTER — Ambulatory Visit: Admitting: Gastroenterology

## 2024-03-30 VITALS — BP 132/80 | HR 80 | Ht 65.0 in | Wt 202.5 lb

## 2024-03-30 DIAGNOSIS — K449 Diaphragmatic hernia without obstruction or gangrene: Secondary | ICD-10-CM

## 2024-03-30 DIAGNOSIS — R1013 Epigastric pain: Secondary | ICD-10-CM | POA: Diagnosis not present

## 2024-03-30 DIAGNOSIS — K5900 Constipation, unspecified: Secondary | ICD-10-CM

## 2024-03-30 DIAGNOSIS — K219 Gastro-esophageal reflux disease without esophagitis: Secondary | ICD-10-CM | POA: Diagnosis not present

## 2024-03-30 MED ORDER — ESOMEPRAZOLE MAGNESIUM 40 MG PO CPDR: 40.0000 mg | DELAYED_RELEASE_CAPSULE | Freq: Two times a day (BID) | ORAL | 3 refills | Status: AC

## 2024-03-30 NOTE — Progress Notes (Signed)
 Philip Howard [id number] [birth year]   Chief Complaint: GERD, abdominal pain  Referring Provider: Benjamine Aland, MD Primary GI MD: Dr. Legrand  HPI: Philip Howard is a 58 y.o. male with past medical history of GERD, high cholesterol, HTN, stroke 2018, adenomatous colon polyp 2022 who presents today for follow up.    Patient seen in urgent care 10/02/2023.  Reported pain with urination, lower abdominal pain, epigastric pain, gas and bloating ongoing for several days.  Was concerned for STI/STD.  Urinalysis did not indicate obvious UTI.  GI cocktail was administered with some relief of patient's symptoms and he was started on omeprazole  20 mg daily to help with reflux symptoms.  Also reported constipation and was advised to increase fiber in his diet and increase fluid intake.   Labs negative for GC/chlamydia/trichomonas.   Abdominal x-ray 10/02/2023 showed nonobstructive bowel gas pattern and no significant stool burden.    At visit 01/01/2024 patient reported 1 year of epigastric pain, heartburn, acid reflux, and nausea.  No improvement on omeprazole .  No prior EGD.  He was started on Protonix  and scheduled for upper endoscopy. Also endorsed 3 to 4 months of constipation, having a bowel movement every 2 to 3 days with straining.  Denied any rectal bleeding.  Last colonoscopy 2022 with findings of 2 tubular adenomas and internal hemorrhoids, 5-year recall.  He was advised to start Benefiber, MiraLAX , and increase water intake.   Labs at that time showed mildly decreased kidney function, otherwise unremarkable with negative celiac panel, normal thyroid function.   He underwent EGD 01/05/2024 with finding of hiatal hernia, congestive gastropathy, duodenopathy, negative for H. Pylori.  At last visit 02/11/2024 patient reported overall improvement in symptoms on Protonix  40 mg daily, though continued to have some breakthrough symptoms about once a week which occurred at night when he laid  down.  Discussed lifestyle modifications for GERD.  Discussed hiatal hernia.  Plan to continue with medication management, but if symptoms unable to be controlled consider antireflux surgery/hiatal hernia repair.  Also endorsed constipation and had previously been advised to start fiber supplement and MiraLAX  but he had not tried either of these.  Due for repeat colonoscopy 2027, and discussed doing this earlier if constipation not improving.  History of adenomatous colon polyps.    Patient states Protonix  is only helping a little.  He typically takes it twice a day, though sometimes forgets the evening dose.  He is taking it on an empty stomach.  Having some intermittent heartburn, not so much acid reflux.  Had heartburn yesterday, drinking water helped.  Denies any dysphagia.  Continues to have daily epigastric pain which feels like a soreness and is worse with palpation over xiphoid process, also worse with laying back in bed.  Has not discussed this with PCP.  Has not had any recent abdominal imaging.  Continues to have issues with constipation.  He did not try a fiber supplement.  Tried MiraLAX  for 1 dose but has not been consistent with use.  States he has the urge to have a bowel movement, but then will have a small bowel movement with sensation of incomplete evacuation.  Denies any blood in his stool or melena.  Change in bowel habits occurred about 6 months ago.  Prior to this he was having a bowel movement every day.  Does feel bloated.  Only feels nauseated if he has not eaten and improves with eating.  Denies any vomiting, fever, chills.   Previous GI Procedures/Imaging  EGD 01/05/2024 - 5 cm hiatal hernia.  - Congestive gastropathy. Questionable clinical significance. Biopsied.  - Erythematous duodenopathy. Mild, nonspecific finding of questionable clinical significance.   The patient' s main complaint of epigastric pain bringing them to the office was pain localized to an area of  focal tenderness over the xyphoid process on exam today. Path: 1. Surgical [P], gastric antrum :       REACTIVE GASTROPATHY WITH MINIMAL CHRONIC GASTRITIS       NEGATIVE FOR H. PYLORI, INTESTINAL METAPLASIA, DYSPLASIA AND CARCINOMA    Colonoscopy 05/15/2021 (Dr. Cindie, [facility]) - Non- bleeding internal hemorrhoids.  - One 5 mm polyp in the descending colon, removed with a cold snare. Resected and retrieved.  - One 8 mm polyp in the descending colon, removed with a cold snare. Resected and retrieved.  - Two 4 to 6 mm polyps in the rectum, removed with a cold snare. Resected and retrieved. - Recall 5 years Path: A. COLON, DESCENDING, POLYPECTOMY:  -  Multiple fragments of tubular adenoma(s)  -  No high-grade dysplasia or malignancy identified   B. RECTUM, POLYPECTOMY:  -  Hyperplastic polyp (4 of 4 fragments)  -  No high-grade dysplasia or malignancy identified    Past Medical History:  Diagnosis Date   Cervicalgia    GERD (gastroesophageal reflux disease)    Hypercholesteremia    Hyperplastic rectal polyp 05/15/2021   Hypertension    Stroke (HCC) 10/2016   Tubular adenoma of colon 05/15/2021    Past Surgical History:  Procedure Laterality Date   arthroscopic surgery  2009   knee   COLONOSCOPY WITH PROPOFOL  N/A 05/15/2021   Procedure: COLONOSCOPY WITH PROPOFOL ;  Surgeon: Cindie Carlin POUR, DO;  Location: [facility];  Service: Endoscopy;  Laterality: N/A;  2:30 / ASA II   POLYPECTOMY  05/15/2021   Procedure: POLYPECTOMY;  Surgeon: Cindie Carlin POUR, DO;  Location: [facility];  Service: Endoscopy;;    Current Outpatient Medications  Medication Sig Dispense Refill   losartan (COZAAR) 50 MG tablet Take 50 mg by mouth daily.     pantoprazole  (PROTONIX ) 40 MG tablet Take 1 tablet (40 mg total) by mouth 2 (two) times daily. 60 tablet 5   polyethylene glycol (MIRALAX ) 17 g packet Take 17 g by mouth daily as needed.     rosuvastatin (CRESTOR) 20 MG tablet Take  20 mg by mouth daily.     No current facility-administered medications for this visit.    Allergies as of 03/30/2024   (No Known Allergies)    Family History  Problem Relation Age of Onset   Cancer Mother    Breast cancer Mother    Heart attack Father    Colon cancer Neg Hx    Stomach cancer Neg Hx    Esophageal cancer Neg Hx    Rectal cancer Neg Hx     Social History   Tobacco Use   Smoking status: Former    Current packs/day: 0.00    Types: Cigarettes    Quit date: 11/02/2016    Years since quitting: 7.4   Smokeless tobacco: Never  Vaping Use   Vaping status: Never Used  Substance Use Topics   Alcohol use: Yes    Alcohol/week: 35.0 standard drinks of alcohol    Types: 28 Cans of beer, 7 Shots of liquor per week    Comment: just on the weekends   Drug use: Yes    Types: Marijuana    Comment: daily  Review of Systems:    Constitutional: No weight loss, fever, chills, weakness or fatigue Cardiovascular: No chest pain Respiratory: No SOB Gastrointestinal: See HPI and otherwise negative   Physical Exam:  Vital signs: BP 132/80 (BP Location: Left Arm)   Pulse 80   Ht 5' 5 (1.651 m)   Wt 202 lb 8 oz (91.9 kg)   SpO2 97%   BMI 33.70 kg/m   Wt Readings from Last 3 Encounters:  03/30/24 202 lb 8 oz (91.9 kg)  02/11/24 204 lb 6 oz (92.7 kg)  01/05/24 200 lb (90.7 kg)   Constitutional: Pleasant, obese male in NAD, alert and cooperative Head:  Normocephalic and atraumatic.  Eyes: No scleral icterus.  Respiratory: Respirations even and unlabored. Lungs clear to auscultation bilaterally.  No wheezes, crackles, or rhonchi.  Cardiovascular:  Regular rate and rhythm. No murmurs. No peripheral edema. Gastrointestinal:  Soft, protuberant, nondistended, mild tenderness to palpation over xiphoid process. No rebound or guarding. Normal bowel sounds. No appreciable masses or hepatomegaly. Rectal:  Not performed.  Neurologic:  Alert and oriented x4;  grossly normal  neurologically.  Skin:   Dry and intact without significant lesions or rashes. Psychiatric: Oriented to person, place and time. Demonstrates good judgement and reason without abnormal affect or behaviors.   RELEVANT LABS AND IMAGING: CBC    Component Value Date/Time   WBC 5.9 01/01/2024 0952   RBC 4.84 01/01/2024 0952   HGB 14.6 01/01/2024 0952   HCT 44.3 01/01/2024 0952   PLT 255.0 01/01/2024 0952   MCV 91.6 01/01/2024 0952   MCH 29.3 07/21/2017 1504   MCHC 32.9 01/01/2024 0952   RDW 14.7 01/01/2024 0952   LYMPHSABS 1.7 01/01/2024 0952   MONOABS 0.6 01/01/2024 0952   EOSABS 0.1 01/01/2024 0952   BASOSABS 0.1 01/01/2024 0952    CMP     Component Value Date/Time   NA 138 01/01/2024 0952   K 4.4 01/01/2024 0952   CL 103 01/01/2024 0952   CO2 28 01/01/2024 0952   GLUCOSE 114 (H) 01/01/2024 0952   BUN 25 (H) 01/01/2024 0952   CREATININE 1.34 01/01/2024 0952   CALCIUM  9.4 01/01/2024 0952   PROT 7.1 01/01/2024 0952   ALBUMIN 4.5 01/01/2024 0952   AST 22 01/01/2024 0952   ALT 26 01/01/2024 0952   ALKPHOS 57 01/01/2024 0952   BILITOT 0.4 01/01/2024 0952   GFRNONAA >60 07/21/2017 1504   GFRAA >60 07/21/2017 1504   Echocardiogram 11/03/2016 Study Conclusions  - Left ventricle: The cavity size was normal. Wall thickness was    normal. Systolic function was normal. The estimated ejection    fraction was in the range of 55% to 60%. Wall motion was normal;    there were no regional wall motion abnormalities. Doppler    parameters are consistent with abnormal left ventricular    relaxation (grade 1 diastolic dysfunction).  - Right atrium: The atrium was mildly dilated.  Impressions:  - Normal LV systolic function; mild diastolic dysfunction; mild    RAE/RVE.   Assessment/Plan:   GERD Hiatal hernia Epigastric pain Constipation Patient taking Protonix  40 mg twice daily.  Continues to have breakthrough heartburn intermittently.  Also continues to have epigastric discomfort  and soreness to palpation over xiphoid process.  He is concerned about this pain.  No recent abdominal imaging so we will order an ultrasound today.  Advised him to follow-up with his PCP.  Possible that his discomfort in this area is worsened by abdominal bloating due to  underlying constipation.  Has not been consistent with MiraLAX , only took 1 dose.  - Stop Protonix  - Switch to Nexium 40 mg twice daily - Continue lifestyle modifications for GERD - If symptoms persist despite medication change, consider referral to discuss hiatal hernia repair/antireflux surgery -Order complete abdominal ultrasound for epigastric pain, though this seems likely related to xiphoid process discomfort possibly worsened by bloating and constipation - Take MiraLAX  1 capful daily and adjust dose as needed - If persistent constipation despite use of MiraLAX , consider repeat colonoscopy for change in bowel habits and history of colon polyps.  Otherwise would be due for recall 2027. - Encouraged follow-up with PCP   Camie Furbish, PA-C [facility] 03/30/2024, 10:16 AM  Patient Care Team: Benjamine Aland, MD as PCP - General (Family Medicine) Cindie Carlin POUR, DO as Consulting Physician (Gastroenterology)

## 2024-03-30 NOTE — Progress Notes (Signed)
 ____________________________________________________________  Attending physician addendum:  Thank you for sending this case to me. I have reviewed the entire note and agree with the plan.   Victory Brand, MD  ____________________________________________________________

## 2024-03-30 NOTE — Patient Instructions (Addendum)
 Stop the Protonix . Start Nexium 40mg  one capsule twice a day. Take 30 minutes prior to food.  This has been sent to your drugstore.  Take over the counter Miralax  as follows: 1 capful daily, adjust to response.   You have been scheduled for an abdominal ultrasound at [facility] on 04/06/2024 at 7:30am. Please arrive 15 minutes prior to your appointment for registration. Make certain not to have anything to eat or drink 6 hours prior to your appointment. Should you need to reschedule your appointment, please contact radiology at [phone]. This test typically takes about 30 minutes to perform.    _______________________________________________________  If your blood pressure at your visit was 140/90 or greater, please contact your primary care physician to follow up on this.  _______________________________________________________  If you are age 72 or older, your body mass index should be between 23-30. Your Body mass index is 33.7 kg/m. If this is out of the aforementioned range listed, please consider follow up with your Primary Care Provider.  If you are age 75 or younger, your body mass index should be between 19-25. Your Body mass index is 33.7 kg/m. If this is out of the aformentioned range listed, please consider follow up with your Primary Care Provider.   ________________________________________________________  The [facility] providers would like to encourage you to use MYCHART to communicate with providers for non-urgent requests or questions.  Due to long hold times on the telephone, sending your provider a message by [facility] may be a faster and more efficient way to get a response.  Please allow 48 business hours for a response.  Please remember that this is for non-urgent requests.  _______________________________________________________  [facility] is using a team-based approach to care.  Your team is made up of your doctor and two to three APPS. Our  APPS (Nurse Practitioners and Physician Assistants) work with your physician to ensure care continuity for you. They are fully qualified to address your health concerns and develop a treatment plan. They communicate directly with your gastroenterologist to care for you. Seeing the Advanced Practice Practitioners on your physician's team can help you by facilitating care more promptly, often allowing for earlier appointments, access to diagnostic testing, procedures, and other specialty referrals.   I appreciate the opportunity to care for you. Camie Furbish, PA

## 2024-04-06 ENCOUNTER — Ambulatory Visit (HOSPITAL_COMMUNITY)
Admission: RE | Admit: 2024-04-06 | Discharge: 2024-04-06 | Disposition: A | Discharge status: Home / Self Care | Source: Ambulatory Visit | Attending: Gastroenterology | Admitting: Gastroenterology

## 2024-04-06 DIAGNOSIS — R1013 Epigastric pain: Secondary | ICD-10-CM | POA: Insufficient documentation

## 2024-04-06 DIAGNOSIS — K7689 Other specified diseases of liver: Secondary | ICD-10-CM | POA: Diagnosis not present

## 2024-04-06 DIAGNOSIS — R1030 Lower abdominal pain, unspecified: Secondary | ICD-10-CM | POA: Diagnosis not present

## 2024-04-08 ENCOUNTER — Telehealth: Payer: Self-pay

## 2024-04-08 ENCOUNTER — Ambulatory Visit: Payer: Self-pay | Admitting: Gastroenterology

## 2024-04-08 NOTE — Telephone Encounter (Signed)
 While speaking with pt, he reported that his pharmacy advised him that insurance was not going to pay for the Nexium. Per pt, the insurance and the doctor's office had to discuss this medication. Advised pt it sounded like he needed a PA. Will route to PA team to review and advise further.

## 2024-04-09 ENCOUNTER — Other Ambulatory Visit (HOSPITAL_COMMUNITY): Payer: Self-pay

## 2024-04-09 ENCOUNTER — Telehealth: Payer: Self-pay

## 2024-04-09 NOTE — Telephone Encounter (Signed)
 Pharmacy Patient Advocate Encounter   Received notification from Pt Calls Messages that prior authorization for Esomeprazole Magnesium 40MG  dr capsules is required/requested.   Insurance verification completed.   The patient is insured through [facility].   Per test claim: PA required; PA started via CoverMyMeds. KEY [id number] . Waiting for clinical questions to populate.

## 2024-04-09 NOTE — Telephone Encounter (Signed)
 Pharmacy Patient Advocate Encounter  Received notification from [facility] that Prior Authorization for Esomeprazole Magnesium 40MG  dr capsules has been APPROVED from 04-09-2024 to 04-09-2025. Ran test claim, Copay is $10.00 for 30 day-supply. This test claim was processed through [facility]- copay amounts may vary at other pharmacies due to pharmacy/plan contracts, or as the patient moves through the different stages of their insurance plan.   PA #/Case ID/Reference #: [id number]

## 2024-05-28 ENCOUNTER — Encounter: Payer: Self-pay | Admitting: Gastroenterology

## 2024-05-28 ENCOUNTER — Ambulatory Visit: Admitting: Gastroenterology

## 2024-05-28 VITALS — BP 118/80 | HR 85 | Ht 65.0 in

## 2024-05-28 DIAGNOSIS — R1013 Epigastric pain: Secondary | ICD-10-CM

## 2024-05-28 DIAGNOSIS — K760 Fatty (change of) liver, not elsewhere classified: Secondary | ICD-10-CM | POA: Diagnosis not present

## 2024-05-28 DIAGNOSIS — K5900 Constipation, unspecified: Secondary | ICD-10-CM | POA: Diagnosis not present

## 2024-05-28 DIAGNOSIS — K449 Diaphragmatic hernia without obstruction or gangrene: Secondary | ICD-10-CM | POA: Diagnosis not present

## 2024-05-28 DIAGNOSIS — K219 Gastro-esophageal reflux disease without esophagitis: Secondary | ICD-10-CM

## 2024-05-28 NOTE — Progress Notes (Signed)
 Philip Howard [id number] 1965/12/29   Chief Complaint: Abdominal pain, burning chest pain  Referring Provider: Benjamine Aland, MD Primary GI MD: Dr. Legrand  HPI: Philip Howard is a 58 y.o. male with past medical history of GERD, high cholesterol, HTN, stroke 2018, adenomatous colon polyp 2022 who presents today for follow up.     Patient seen in urgent care 10/02/2023.  Reported pain with urination, lower abdominal pain, epigastric pain, gas and bloating ongoing for several days.  Was concerned for STI/STD.  Urinalysis did not indicate obvious UTI.  GI cocktail was administered with some relief of patient's symptoms and he was started on omeprazole  20 mg daily to help with reflux symptoms.  Also reported constipation and was advised to increase fiber in his diet and increase fluid intake.   Labs negative for GC/chlamydia/trichomonas.   Abdominal x-ray 10/02/2023 showed nonobstructive bowel gas pattern and no significant stool burden.    Visit 01/01/2024 patient reported 1 year of epigastric pain, heartburn, acid reflux, and nausea.  No improvement on omeprazole .  No prior EGD.  He was started on Protonix  and scheduled for upper endoscopy. Also endorsed 3 to 4 months of constipation, having a bowel movement every 2 to 3 days with straining.  Denied any rectal bleeding.  Last colonoscopy 2022 with findings of 2 tubular adenomas and internal hemorrhoids, 5-year recall.  He was advised to start Benefiber, MiraLAX , and increase water intake.   Labs at that time showed mildly decreased kidney function, otherwise unremarkable with negative celiac panel, normal thyroid function.   He underwent EGD 01/05/2024 with finding of hiatal hernia, congestive gastropathy, duodenopathy, negative for H. Pylori.   Visit 02/11/2024 patient reported overall improvement in symptoms on Protonix  40 mg daily, though continued to have some breakthrough symptoms about once a week which occurred at night when he  laid down.  Discussed lifestyle modifications for GERD.  Discussed hiatal hernia.  Plan to continue with medication management, but if symptoms unable to be controlled consider antireflux surgery/hiatal hernia repair.   Also endorsed constipation and had previously been advised to start fiber supplement and MiraLAX  but he had not tried either of these.  Due for repeat colonoscopy 2027, and discussed doing this earlier if constipation not improving.  History of adenomatous colon polyps.  Last visit 03/30/2024 patient continued to have breakthrough heartburn intermittently on Protonix  40 mg twice daily, also continued to have epigastric discomfort and soreness to palpation over xiphoid process.  Ultrasound was ordered as he had not had any recent abdominal imaging, advised to follow-up with PCP.  Had not been consistent with MiraLAX  and had only taken 1 dose. He was advised to stop Protonix , switch to Nexium  40 mg twice daily, consider referral to discuss hiatal hernia repair/antireflux surgery if symptoms persist despite medication, take MiraLAX  1 capful daily with consideration for repeat colonoscopy for change in bowel habits and history of colon polyps if persistent constipation despite use of MiraLAX .  Otherwise due for recall in 2027.  His ultrasound showed hepatic steatosis, calculated Fib 4 score was low risk.  Nexium  went to prior authorization and was ultimately approved.   Discussed the use of AI scribe software for clinical note transcription with the patient, who gave verbal consent to proceed.  History of Present Illness Philip Howard is a 58 year old male with a hiatal hernia who presents with persistent abdominal and chest pain.  Upper abdominal and chest pain - Persistent pain located in the upper abdomen  and lower chest area - Burning sensation - Slight improvement in pain but remains present - Nexium  twice daily provides more relief than pantoprazole , especially for lower  abdominal discomfort - Continued irritation and sensation of a 'knot' in the chest area - No dysphagia or sensation of food impaction  Constipation - Ongoing constipation  - Has not used Miralax  due to previous advice by PCP to avoid laxatives  - Infrequent and small bowel movements - No hematochezia - History of colonic polyps - Colonoscopy due 2027  Hepatic steatosis - Previous abdominal ultrasound demonstrated hepatic steatosis - Liver enzymes are within normal limits - No longer consumes alcohol regularly   Previous GI Procedures/Imaging   Abdominal ultrasound 04/07/2024 IMPRESSION: Diffuse increased echogenicity of the hepatic parenchyma is a nonspecific indicator of hepatocellular dysfunction, most commonly steatosis.  EGD 01/05/2024 - 5 cm hiatal hernia.  - Congestive gastropathy. Questionable clinical significance. Biopsied.  - Erythematous duodenopathy. Mild, nonspecific finding of questionable clinical significance.   The patient' s main complaint of epigastric pain bringing them to the office was pain localized to an area of focal tenderness over the xyphoid process on exam today.  Path: 1. Surgical [P], gastric antrum :       REACTIVE GASTROPATHY WITH MINIMAL CHRONIC GASTRITIS       NEGATIVE FOR H. PYLORI, INTESTINAL METAPLASIA, DYSPLASIA AND CARCINOMA    Colonoscopy 05/15/2021 (Dr. Cindie, [facility]) - Non- bleeding internal hemorrhoids.  - One 5 mm polyp in the descending colon, removed with a cold snare. Resected and retrieved.  - One 8 mm polyp in the descending colon, removed with a cold snare. Resected and retrieved.  - Two 4 to 6 mm polyps in the rectum, removed with a cold snare. Resected and retrieved. - Recall 5 years Path: A. COLON, DESCENDING, POLYPECTOMY:  -  Multiple fragments of tubular adenoma(s)  -  No high-grade dysplasia or malignancy identified   B. RECTUM, POLYPECTOMY:  -  Hyperplastic polyp (4 of 4 fragments)  -  No  high-grade dysplasia or malignancy identified    Past Medical History:  Diagnosis Date   Cervicalgia    GERD (gastroesophageal reflux disease)    Hypercholesteremia    Hyperplastic rectal polyp 05/15/2021   Hypertension    Stroke (HCC) 10/2016   Tubular adenoma of colon 05/15/2021    Past Surgical History:  Procedure Laterality Date   arthroscopic surgery  2009   knee   COLONOSCOPY WITH PROPOFOL  N/A 05/15/2021   Procedure: COLONOSCOPY WITH PROPOFOL ;  Surgeon: Cindie Carlin POUR, DO;  Location: [facility];  Service: Endoscopy;  Laterality: N/A;  2:30 / ASA II   POLYPECTOMY  05/15/2021   Procedure: POLYPECTOMY;  Surgeon: Cindie Carlin POUR, DO;  Location: [facility];  Service: Endoscopy;;    Current Outpatient Medications  Medication Sig Dispense Refill   esomeprazole  (NEXIUM ) 40 MG capsule Take 1 capsule (40 mg total) by mouth 2 (two) times daily before a meal. 60 capsule 3   hydrochlorothiazide (HYDRODIURIL) 25 MG tablet Take 25 mg by mouth daily.     losartan (COZAAR) 50 MG tablet Take 50 mg by mouth daily.     polyethylene glycol (MIRALAX ) 17 g packet Take 17 g by mouth daily as needed.     rosuvastatin (CRESTOR) 20 MG tablet Take 20 mg by mouth daily.     No current facility-administered medications for this visit.    Allergies as of 05/28/2024   (No Known Allergies)    Family  History  Problem Relation Age of Onset   Cancer Mother    Breast cancer Mother    Heart attack Father    Colon cancer Neg Hx    Stomach cancer Neg Hx    Esophageal cancer Neg Hx    Rectal cancer Neg Hx     Social History[1]   Review of Systems:    Constitutional: No weight loss, fever, chills Cardiovascular: No chest pain Respiratory: No SOB Gastrointestinal: See HPI and otherwise negative   Physical Exam:  Vital signs: BP 118/80   Pulse 85   Ht 5' 5 (1.651 m)   BMI 33.70 kg/m   Constitutional: Pleasant, overweight male in NAD, alert and cooperative Head:  Normocephalic  and atraumatic.  Respiratory: Respirations even and unlabored. Lungs clear to auscultation bilaterally.  No wheezes, crackles, or rhonchi.  Cardiovascular:  Regular rate and rhythm. No murmurs. No peripheral edema. Gastrointestinal:  Soft, nondistended, nontender.  Does have tenderness to palpation over xiphoid process.  No rebound or guarding. Normal bowel sounds. No appreciable masses or hepatomegaly. Rectal:  Not performed.  Neurologic:  Alert and oriented x4;  grossly normal neurologically.  Skin:   Dry and intact without significant lesions or rashes. Psychiatric: Oriented to person, place and time. Demonstrates good judgement and reason without abnormal affect or behaviors.   RELEVANT LABS AND IMAGING: CBC    Component Value Date/Time   WBC 5.9 01/01/2024 0952   RBC 4.84 01/01/2024 0952   HGB 14.6 01/01/2024 0952   HCT 44.3 01/01/2024 0952   PLT 255.0 01/01/2024 0952   MCV 91.6 01/01/2024 0952   MCH 29.3 07/21/2017 1504   MCHC 32.9 01/01/2024 0952   RDW 14.7 01/01/2024 0952   LYMPHSABS 1.7 01/01/2024 0952   MONOABS 0.6 01/01/2024 0952   EOSABS 0.1 01/01/2024 0952   BASOSABS 0.1 01/01/2024 0952    CMP     Component Value Date/Time   NA 138 01/01/2024 0952   K 4.4 01/01/2024 0952   CL 103 01/01/2024 0952   CO2 28 01/01/2024 0952   GLUCOSE 114 (H) 01/01/2024 0952   BUN 25 (H) 01/01/2024 0952   CREATININE 1.34 01/01/2024 0952   CALCIUM  9.4 01/01/2024 0952   PROT 7.1 01/01/2024 0952   ALBUMIN 4.5 01/01/2024 0952   AST 22 01/01/2024 0952   ALT 26 01/01/2024 0952   ALKPHOS 57 01/01/2024 0952   BILITOT 0.4 01/01/2024 0952   GFRNONAA >60 07/21/2017 1504   GFRAA >60 07/21/2017 1504   Echocardiogram 11/03/2016 Study Conclusions  - Left ventricle: The cavity size was normal. Wall thickness was    normal. Systolic function was normal. The estimated ejection    fraction was in the range of 55% to 60%. Wall motion was normal;    there were no regional wall motion  abnormalities. Doppler    parameters are consistent with abnormal left ventricular    relaxation (grade 1 diastolic dysfunction).  - Right atrium: The atrium was mildly dilated.  Impressions:  - Normal LV systolic function; mild diastolic dysfunction; mild    RAE/RVE.   Assessment/Plan:   Assessment & Plan GERD and hiatal hernia Persistent upper abdominal and lower chest pain with burning. Symptoms improved with Nexium  but have been persistent.  He is interested in discussing hiatal hernia repair at this point.  - Refer to surgery for hiatal hernia repair consultation.  Constipation Ongoing constipation with small bowel movements. No rectal bleeding. Previous colonoscopy in 2022 with polyps found and 5-year recall recommended.  We have discussed starting MiraLAX  now on multiple occasions, he still has not started taking it.  States his PCP told him not to take any laxatives due to possible dependence.  We discussed that MiraLAX  does not cause dependence, again advised him to try taking daily and see if this helps.  Otherwise at next visit we will plan to schedule colonoscopy for change in bowel habits.  - Start Miralax  daily, adjust dosage as needed. - Increase dietary fiber, water intake, and exercise. - Follow up 3 months  Hepatic steatosis Ultrasound showed hepatic steatosis with normal liver enzymes. Low risk for liver scarring. Advised on lifestyle modifications.  - Continue weight loss, healthy eating, and regular exercise. - Avoid alcohol. - Repeat LFTs every 6 months, may be done with PCP   Fibrosis 4 Score = 1.02 (Low risk)        Interpretation for patients with NAFLD          <1.30       -  F0-F1 (Low risk)          1.30-2.67 -  Indeterminate           >2.67      -  F3-F4 (High risk)     Validated for ages 4-65        Camie Furbish, PA-C [facility] 05/28/2024, 11:17 AM  Patient Care Team: Benjamine Aland, MD as PCP - General (Family Medicine) Cindie Carlin POUR, DO as Consulting Physician (Gastroenterology)       [1]  Social History Tobacco Use   Smoking status: Former    Current packs/day: 0.00    Average packs/day: 0.5 packs/day    Types: Cigarettes    Quit date: 11/02/2016    Years since quitting: 7.5   Smokeless tobacco: Never  Vaping Use   Vaping status: Never Used  Substance Use Topics   Alcohol use: Yes    Alcohol/week: 35.0 standard drinks of alcohol    Types: 28 Cans of beer, 7 Shots of liquor per week    Comment: just on the weekends   Drug use: Yes    Types: Marijuana    Comment: daily

## 2024-05-28 NOTE — Patient Instructions (Addendum)
 TAKE 1 CAPFUL OF MIRALAX  DAILY  We are referring you to [facility]. They will contact you directly to schedule an appointment.  It may take a week or more before you hear from them.  Please feel free to contact us  if you have not heard from them within 2 weeks and we will follow up on the referral.   _______________________________________________________  If your blood pressure at your visit was 140/90 or greater, please contact your primary care physician to follow up on this.  _______________________________________________________  If you are age 3 or older, your body mass index should be between 23-30. Your Body mass index is 33.7 kg/m. If this is out of the aforementioned range listed, please consider follow up with your Primary Care Provider.  If you are age 60 or younger, your body mass index should be between 19-25. Your Body mass index is 33.7 kg/m. If this is out of the aformentioned range listed, please consider follow up with your Primary Care Provider.   ________________________________________________________  The  GI providers would like to encourage you to use MYCHART to communicate with providers for non-urgent requests or questions.  Due to long hold times on the telephone, sending your provider a message by [facility] may be a faster and more efficient way to get a response.  Please allow 48 business hours for a response.  Please remember that this is for non-urgent requests.  _______________________________________________________  [facility] is using a team-based approach to care.  Your team is made up of your doctor and two to three APPS. Our APPS (Nurse Practitioners and Physician Assistants) work with your physician to ensure care continuity for you. They are fully qualified to address your health concerns and develop a treatment plan. They communicate directly with your gastroenterologist to care for you. Seeing the Advanced Practice  Practitioners on your physician's team can help you by facilitating care more promptly, often allowing for earlier appointments, access to diagnostic testing, procedures, and other specialty referrals.

## 2024-06-02 NOTE — Progress Notes (Signed)
 ____________________________________________________________  Attending physician addendum:  Thank you for sending this case to me. I have reviewed the entire note and agree with the plan.  He is welcome to have a surgical consultation regarding repair of his hiatal hernia.  In my opinion, it remains difficult to know how much of his epigastric/lower chest discomfort is truly due to that hiatal hernia.  Based on his history and my exam the day he came for an upper endoscopy, I believe he has pain and tenderness centered over the xiphoid process.  Victory Brand, MD  ____________________________________________________________
